# Patient Record
Sex: Female | Born: 1935 | Race: White | Hispanic: No | Smoking: Never smoker
Health system: Southern US, Community
[De-identification: ages and names within clinical notes are randomized; demographics above are authoritative.]

## PROBLEM LIST (undated history)

## (undated) DIAGNOSIS — I10 Essential (primary) hypertension: Secondary | ICD-10-CM

## (undated) DIAGNOSIS — E785 Hyperlipidemia, unspecified: Secondary | ICD-10-CM

## (undated) DIAGNOSIS — E119 Type 2 diabetes mellitus without complications: Secondary | ICD-10-CM

## (undated) DIAGNOSIS — G4730 Sleep apnea, unspecified: Secondary | ICD-10-CM

## (undated) DIAGNOSIS — C801 Malignant (primary) neoplasm, unspecified: Secondary | ICD-10-CM

## (undated) DIAGNOSIS — M21371 Foot drop, right foot: Secondary | ICD-10-CM

## (undated) HISTORY — DX: Essential (primary) hypertension: I10

## (undated) HISTORY — DX: Type 2 diabetes mellitus without complications: E11.9

## (undated) HISTORY — DX: Foot drop, right foot: M21.371

## (undated) HISTORY — DX: Hyperlipidemia, unspecified: E78.5

## (undated) HISTORY — PX: COLONOSCOPY: SHX174

## (undated) HISTORY — DX: Malignant (primary) neoplasm, unspecified: C80.1

## (undated) HISTORY — DX: Sleep apnea, unspecified: G47.30

---

## 1989-06-03 HISTORY — PX: OTHER SURGICAL HISTORY: SHX169

## 1989-06-03 HISTORY — PX: COLON SURGERY: SHX602

## 2005-04-02 ENCOUNTER — Ambulatory Visit: Payer: Self-pay | Admitting: Family Medicine

## 2005-04-03 ENCOUNTER — Ambulatory Visit: Payer: Self-pay | Admitting: Family Medicine

## 2005-05-03 ENCOUNTER — Ambulatory Visit: Payer: Self-pay | Admitting: Family Medicine

## 2005-05-21 ENCOUNTER — Ambulatory Visit: Payer: Self-pay | Admitting: General Surgery

## 2005-05-21 LAB — HM COLONOSCOPY: HM Colonoscopy: NORMAL

## 2005-06-03 ENCOUNTER — Ambulatory Visit: Payer: Self-pay | Admitting: Family Medicine

## 2005-09-03 ENCOUNTER — Ambulatory Visit: Payer: Self-pay | Admitting: Family Medicine

## 2011-06-20 DIAGNOSIS — H269 Unspecified cataract: Secondary | ICD-10-CM | POA: Diagnosis not present

## 2011-06-20 DIAGNOSIS — E119 Type 2 diabetes mellitus without complications: Secondary | ICD-10-CM | POA: Diagnosis not present

## 2011-07-01 DIAGNOSIS — E7800 Pure hypercholesterolemia, unspecified: Secondary | ICD-10-CM | POA: Diagnosis not present

## 2011-07-01 DIAGNOSIS — I10 Essential (primary) hypertension: Secondary | ICD-10-CM | POA: Diagnosis not present

## 2011-07-01 DIAGNOSIS — E119 Type 2 diabetes mellitus without complications: Secondary | ICD-10-CM | POA: Diagnosis not present

## 2011-07-01 DIAGNOSIS — Z23 Encounter for immunization: Secondary | ICD-10-CM | POA: Diagnosis not present

## 2011-10-30 DIAGNOSIS — D649 Anemia, unspecified: Secondary | ICD-10-CM | POA: Diagnosis not present

## 2011-10-30 DIAGNOSIS — E119 Type 2 diabetes mellitus without complications: Secondary | ICD-10-CM | POA: Diagnosis not present

## 2011-10-30 DIAGNOSIS — E7800 Pure hypercholesterolemia, unspecified: Secondary | ICD-10-CM | POA: Diagnosis not present

## 2011-10-30 DIAGNOSIS — Z23 Encounter for immunization: Secondary | ICD-10-CM | POA: Diagnosis not present

## 2011-10-30 DIAGNOSIS — I10 Essential (primary) hypertension: Secondary | ICD-10-CM | POA: Diagnosis not present

## 2011-10-30 DIAGNOSIS — K5900 Constipation, unspecified: Secondary | ICD-10-CM | POA: Diagnosis not present

## 2011-10-30 DIAGNOSIS — Z0000 Encounter for general adult medical examination without abnormal findings: Secondary | ICD-10-CM | POA: Diagnosis not present

## 2011-11-06 DIAGNOSIS — I10 Essential (primary) hypertension: Secondary | ICD-10-CM | POA: Diagnosis not present

## 2011-11-06 DIAGNOSIS — E119 Type 2 diabetes mellitus without complications: Secondary | ICD-10-CM | POA: Diagnosis not present

## 2011-11-06 DIAGNOSIS — R079 Chest pain, unspecified: Secondary | ICD-10-CM | POA: Diagnosis not present

## 2011-11-06 DIAGNOSIS — G4730 Sleep apnea, unspecified: Secondary | ICD-10-CM | POA: Diagnosis not present

## 2011-11-29 ENCOUNTER — Ambulatory Visit: Payer: Self-pay | Admitting: Family Medicine

## 2011-11-29 DIAGNOSIS — Z1231 Encounter for screening mammogram for malignant neoplasm of breast: Secondary | ICD-10-CM | POA: Diagnosis not present

## 2011-11-29 DIAGNOSIS — R928 Other abnormal and inconclusive findings on diagnostic imaging of breast: Secondary | ICD-10-CM | POA: Diagnosis not present

## 2011-12-18 DIAGNOSIS — M25519 Pain in unspecified shoulder: Secondary | ICD-10-CM | POA: Diagnosis not present

## 2011-12-18 DIAGNOSIS — E7800 Pure hypercholesterolemia, unspecified: Secondary | ICD-10-CM | POA: Diagnosis not present

## 2011-12-18 DIAGNOSIS — Z0000 Encounter for general adult medical examination without abnormal findings: Secondary | ICD-10-CM | POA: Diagnosis not present

## 2011-12-18 DIAGNOSIS — M129 Arthropathy, unspecified: Secondary | ICD-10-CM | POA: Diagnosis not present

## 2012-01-07 DIAGNOSIS — M75100 Unspecified rotator cuff tear or rupture of unspecified shoulder, not specified as traumatic: Secondary | ICD-10-CM | POA: Diagnosis not present

## 2012-01-07 DIAGNOSIS — M1710 Unilateral primary osteoarthritis, unspecified knee: Secondary | ICD-10-CM | POA: Diagnosis not present

## 2012-01-08 DIAGNOSIS — M75100 Unspecified rotator cuff tear or rupture of unspecified shoulder, not specified as traumatic: Secondary | ICD-10-CM | POA: Diagnosis not present

## 2012-01-09 DIAGNOSIS — H269 Unspecified cataract: Secondary | ICD-10-CM | POA: Diagnosis not present

## 2012-01-09 DIAGNOSIS — E119 Type 2 diabetes mellitus without complications: Secondary | ICD-10-CM | POA: Diagnosis not present

## 2012-01-10 DIAGNOSIS — M75100 Unspecified rotator cuff tear or rupture of unspecified shoulder, not specified as traumatic: Secondary | ICD-10-CM | POA: Diagnosis not present

## 2012-01-13 DIAGNOSIS — M75100 Unspecified rotator cuff tear or rupture of unspecified shoulder, not specified as traumatic: Secondary | ICD-10-CM | POA: Diagnosis not present

## 2012-01-15 DIAGNOSIS — M75100 Unspecified rotator cuff tear or rupture of unspecified shoulder, not specified as traumatic: Secondary | ICD-10-CM | POA: Diagnosis not present

## 2012-01-20 DIAGNOSIS — M75100 Unspecified rotator cuff tear or rupture of unspecified shoulder, not specified as traumatic: Secondary | ICD-10-CM | POA: Diagnosis not present

## 2012-01-22 DIAGNOSIS — M75100 Unspecified rotator cuff tear or rupture of unspecified shoulder, not specified as traumatic: Secondary | ICD-10-CM | POA: Diagnosis not present

## 2012-01-27 DIAGNOSIS — M1710 Unilateral primary osteoarthritis, unspecified knee: Secondary | ICD-10-CM | POA: Diagnosis not present

## 2012-01-27 DIAGNOSIS — M75100 Unspecified rotator cuff tear or rupture of unspecified shoulder, not specified as traumatic: Secondary | ICD-10-CM | POA: Diagnosis not present

## 2012-01-29 DIAGNOSIS — M1710 Unilateral primary osteoarthritis, unspecified knee: Secondary | ICD-10-CM | POA: Diagnosis not present

## 2012-01-29 DIAGNOSIS — M75100 Unspecified rotator cuff tear or rupture of unspecified shoulder, not specified as traumatic: Secondary | ICD-10-CM | POA: Diagnosis not present

## 2012-01-31 DIAGNOSIS — H2510 Age-related nuclear cataract, unspecified eye: Secondary | ICD-10-CM | POA: Diagnosis not present

## 2012-02-05 DIAGNOSIS — M75100 Unspecified rotator cuff tear or rupture of unspecified shoulder, not specified as traumatic: Secondary | ICD-10-CM | POA: Diagnosis not present

## 2012-02-07 DIAGNOSIS — H2510 Age-related nuclear cataract, unspecified eye: Secondary | ICD-10-CM | POA: Diagnosis not present

## 2012-02-11 DIAGNOSIS — M75100 Unspecified rotator cuff tear or rupture of unspecified shoulder, not specified as traumatic: Secondary | ICD-10-CM | POA: Diagnosis not present

## 2012-02-13 DIAGNOSIS — M75100 Unspecified rotator cuff tear or rupture of unspecified shoulder, not specified as traumatic: Secondary | ICD-10-CM | POA: Diagnosis not present

## 2012-02-18 DIAGNOSIS — M75100 Unspecified rotator cuff tear or rupture of unspecified shoulder, not specified as traumatic: Secondary | ICD-10-CM | POA: Diagnosis not present

## 2012-02-20 DIAGNOSIS — M75100 Unspecified rotator cuff tear or rupture of unspecified shoulder, not specified as traumatic: Secondary | ICD-10-CM | POA: Diagnosis not present

## 2012-02-25 DIAGNOSIS — M75100 Unspecified rotator cuff tear or rupture of unspecified shoulder, not specified as traumatic: Secondary | ICD-10-CM | POA: Diagnosis not present

## 2012-02-27 DIAGNOSIS — E7800 Pure hypercholesterolemia, unspecified: Secondary | ICD-10-CM | POA: Diagnosis not present

## 2012-02-27 DIAGNOSIS — M75100 Unspecified rotator cuff tear or rupture of unspecified shoulder, not specified as traumatic: Secondary | ICD-10-CM | POA: Diagnosis not present

## 2012-02-27 DIAGNOSIS — Z23 Encounter for immunization: Secondary | ICD-10-CM | POA: Diagnosis not present

## 2012-02-27 DIAGNOSIS — Z0000 Encounter for general adult medical examination without abnormal findings: Secondary | ICD-10-CM | POA: Diagnosis not present

## 2012-03-03 DIAGNOSIS — M75100 Unspecified rotator cuff tear or rupture of unspecified shoulder, not specified as traumatic: Secondary | ICD-10-CM | POA: Diagnosis not present

## 2012-03-03 DIAGNOSIS — M1710 Unilateral primary osteoarthritis, unspecified knee: Secondary | ICD-10-CM | POA: Diagnosis not present

## 2012-03-23 DIAGNOSIS — Z961 Presence of intraocular lens: Secondary | ICD-10-CM | POA: Diagnosis not present

## 2012-03-23 DIAGNOSIS — H2510 Age-related nuclear cataract, unspecified eye: Secondary | ICD-10-CM | POA: Diagnosis not present

## 2012-03-23 DIAGNOSIS — E119 Type 2 diabetes mellitus without complications: Secondary | ICD-10-CM | POA: Diagnosis not present

## 2012-03-23 DIAGNOSIS — H269 Unspecified cataract: Secondary | ICD-10-CM | POA: Diagnosis not present

## 2012-03-24 DIAGNOSIS — H2510 Age-related nuclear cataract, unspecified eye: Secondary | ICD-10-CM | POA: Diagnosis not present

## 2012-04-01 DIAGNOSIS — Z0000 Encounter for general adult medical examination without abnormal findings: Secondary | ICD-10-CM | POA: Diagnosis not present

## 2012-04-01 DIAGNOSIS — I10 Essential (primary) hypertension: Secondary | ICD-10-CM | POA: Diagnosis not present

## 2012-04-01 DIAGNOSIS — G4730 Sleep apnea, unspecified: Secondary | ICD-10-CM | POA: Diagnosis not present

## 2012-04-01 DIAGNOSIS — E119 Type 2 diabetes mellitus without complications: Secondary | ICD-10-CM | POA: Diagnosis not present

## 2012-04-03 HISTORY — PX: EYE SURGERY: SHX253

## 2012-04-20 DIAGNOSIS — E119 Type 2 diabetes mellitus without complications: Secondary | ICD-10-CM | POA: Diagnosis not present

## 2012-04-20 DIAGNOSIS — H269 Unspecified cataract: Secondary | ICD-10-CM | POA: Diagnosis not present

## 2012-04-20 DIAGNOSIS — Z961 Presence of intraocular lens: Secondary | ICD-10-CM | POA: Diagnosis not present

## 2012-04-20 DIAGNOSIS — H2510 Age-related nuclear cataract, unspecified eye: Secondary | ICD-10-CM | POA: Diagnosis not present

## 2012-05-11 DIAGNOSIS — Z23 Encounter for immunization: Secondary | ICD-10-CM | POA: Diagnosis not present

## 2012-05-11 DIAGNOSIS — G4730 Sleep apnea, unspecified: Secondary | ICD-10-CM | POA: Diagnosis not present

## 2012-06-03 HISTORY — PX: MASTECTOMY: SHX3

## 2012-07-29 DIAGNOSIS — E119 Type 2 diabetes mellitus without complications: Secondary | ICD-10-CM | POA: Diagnosis not present

## 2012-07-29 DIAGNOSIS — F4320 Adjustment disorder, unspecified: Secondary | ICD-10-CM | POA: Diagnosis not present

## 2012-07-29 DIAGNOSIS — E7800 Pure hypercholesterolemia, unspecified: Secondary | ICD-10-CM | POA: Diagnosis not present

## 2012-07-29 DIAGNOSIS — I10 Essential (primary) hypertension: Secondary | ICD-10-CM | POA: Diagnosis not present

## 2012-08-11 DIAGNOSIS — R079 Chest pain, unspecified: Secondary | ICD-10-CM | POA: Diagnosis not present

## 2012-08-11 DIAGNOSIS — I10 Essential (primary) hypertension: Secondary | ICD-10-CM | POA: Diagnosis not present

## 2012-08-11 DIAGNOSIS — E782 Mixed hyperlipidemia: Secondary | ICD-10-CM | POA: Diagnosis not present

## 2012-10-28 DIAGNOSIS — E119 Type 2 diabetes mellitus without complications: Secondary | ICD-10-CM | POA: Diagnosis not present

## 2012-10-28 DIAGNOSIS — F4320 Adjustment disorder, unspecified: Secondary | ICD-10-CM | POA: Diagnosis not present

## 2012-10-28 DIAGNOSIS — B351 Tinea unguium: Secondary | ICD-10-CM | POA: Diagnosis not present

## 2012-10-28 DIAGNOSIS — E7800 Pure hypercholesterolemia, unspecified: Secondary | ICD-10-CM | POA: Diagnosis not present

## 2012-10-28 DIAGNOSIS — I10 Essential (primary) hypertension: Secondary | ICD-10-CM | POA: Diagnosis not present

## 2012-10-28 DIAGNOSIS — R609 Edema, unspecified: Secondary | ICD-10-CM | POA: Diagnosis not present

## 2012-11-06 DIAGNOSIS — D649 Anemia, unspecified: Secondary | ICD-10-CM | POA: Diagnosis not present

## 2012-11-09 DIAGNOSIS — M79609 Pain in unspecified limb: Secondary | ICD-10-CM | POA: Diagnosis not present

## 2012-11-09 DIAGNOSIS — B351 Tinea unguium: Secondary | ICD-10-CM | POA: Diagnosis not present

## 2012-11-09 DIAGNOSIS — E119 Type 2 diabetes mellitus without complications: Secondary | ICD-10-CM | POA: Diagnosis not present

## 2013-01-27 DIAGNOSIS — I10 Essential (primary) hypertension: Secondary | ICD-10-CM | POA: Diagnosis not present

## 2013-01-27 DIAGNOSIS — Z23 Encounter for immunization: Secondary | ICD-10-CM | POA: Diagnosis not present

## 2013-01-27 DIAGNOSIS — F4320 Adjustment disorder, unspecified: Secondary | ICD-10-CM | POA: Diagnosis not present

## 2013-01-27 DIAGNOSIS — E119 Type 2 diabetes mellitus without complications: Secondary | ICD-10-CM | POA: Diagnosis not present

## 2013-01-27 DIAGNOSIS — E7800 Pure hypercholesterolemia, unspecified: Secondary | ICD-10-CM | POA: Diagnosis not present

## 2013-02-04 ENCOUNTER — Ambulatory Visit: Payer: Self-pay | Admitting: Family Medicine

## 2013-02-04 DIAGNOSIS — R922 Inconclusive mammogram: Secondary | ICD-10-CM | POA: Diagnosis not present

## 2013-02-04 DIAGNOSIS — Z1231 Encounter for screening mammogram for malignant neoplasm of breast: Secondary | ICD-10-CM | POA: Diagnosis not present

## 2013-02-08 DIAGNOSIS — E119 Type 2 diabetes mellitus without complications: Secondary | ICD-10-CM | POA: Diagnosis not present

## 2013-02-08 DIAGNOSIS — I10 Essential (primary) hypertension: Secondary | ICD-10-CM | POA: Diagnosis not present

## 2013-02-08 DIAGNOSIS — I2789 Other specified pulmonary heart diseases: Secondary | ICD-10-CM | POA: Diagnosis not present

## 2013-02-08 DIAGNOSIS — G4730 Sleep apnea, unspecified: Secondary | ICD-10-CM | POA: Diagnosis not present

## 2013-02-17 ENCOUNTER — Ambulatory Visit: Payer: Self-pay | Admitting: Family Medicine

## 2013-02-17 DIAGNOSIS — N63 Unspecified lump in unspecified breast: Secondary | ICD-10-CM | POA: Diagnosis not present

## 2013-02-17 LAB — HM MAMMOGRAPHY

## 2013-02-24 ENCOUNTER — Ambulatory Visit: Payer: Self-pay | Admitting: Family Medicine

## 2013-02-24 DIAGNOSIS — C50219 Malignant neoplasm of upper-inner quadrant of unspecified female breast: Secondary | ICD-10-CM | POA: Diagnosis not present

## 2013-02-25 HISTORY — PX: BREAST BIOPSY: SHX20

## 2013-02-26 ENCOUNTER — Encounter: Payer: Self-pay | Admitting: *Deleted

## 2013-03-03 DIAGNOSIS — M21371 Foot drop, right foot: Secondary | ICD-10-CM

## 2013-03-03 DIAGNOSIS — C801 Malignant (primary) neoplasm, unspecified: Secondary | ICD-10-CM

## 2013-03-03 HISTORY — DX: Foot drop, right foot: M21.371

## 2013-03-03 HISTORY — DX: Malignant (primary) neoplasm, unspecified: C80.1

## 2013-03-03 LAB — PATHOLOGY REPORT

## 2013-03-04 ENCOUNTER — Ambulatory Visit (INDEPENDENT_AMBULATORY_CARE_PROVIDER_SITE_OTHER): Payer: Medicare Other | Admitting: General Surgery

## 2013-03-04 ENCOUNTER — Encounter: Payer: Self-pay | Admitting: General Surgery

## 2013-03-04 ENCOUNTER — Other Ambulatory Visit: Payer: Medicare Other

## 2013-03-04 VITALS — BP 138/60 | HR 80 | Resp 16 | Ht 67.5 in | Wt 231.0 lb

## 2013-03-04 DIAGNOSIS — C50912 Malignant neoplasm of unspecified site of left female breast: Secondary | ICD-10-CM

## 2013-03-04 DIAGNOSIS — C50919 Malignant neoplasm of unspecified site of unspecified female breast: Secondary | ICD-10-CM

## 2013-03-04 DIAGNOSIS — Z853 Personal history of malignant neoplasm of breast: Secondary | ICD-10-CM | POA: Insufficient documentation

## 2013-03-04 NOTE — Progress Notes (Signed)
Patient ID: Robin Ashley, female   DOB: [birth year], 79 y.o.   MRN: [id number]  Chief Complaint  Patient presents with  . Follow-up    mammogram    HPI Robin Ashley is a 79 y.o. female here for follow up from left breast biopsy done at [facility] on 02/25/13. Pathology was positive for carcinoma. The patient denies anything problems with the breasts. No prior breast problems or family history. She does not check her breasts regularly but does get regular mammograms done.   The patient's recent mammogram was abnormal prompting additional studies including core biopsy. She is accompanied today by her daughter, Robin Ashley, was present for the interview and exam.  HPI  Past Medical History  Diagnosis Date  . Diabetes mellitus without complication   . Hypertension   . Hyperlipidemia     Past Surgical History  Procedure Laterality Date  . Intestinal balloon removed  1991  . Eye surgery Bilateral 04/2012  . Breast biopsy Left 02/25/13    Family History  Problem Relation Age of Onset  . Diabetes Brother     Social History History  Substance Use Topics  . Smoking status: Never Smoker   . Smokeless tobacco: Never Used  . Alcohol Use: No    No Known Allergies  Current Outpatient Prescriptions  Medication Sig Dispense Refill  . amLODipine (NORVASC) 10 MG tablet Take 10 mg by mouth daily.      Marland Kitchen atorvastatin (LIPITOR) 80 MG tablet Take 80 mg by mouth daily.      Marland Kitchen etodolac (LODINE) 300 MG capsule Take 300 mg by mouth 3 (three) times daily.      . furosemide (LASIX) 20 MG tablet Take 20 mg by mouth daily.      Marland Kitchen lisinopril (PRINIVIL,ZESTRIL) 10 MG tablet Take 10 mg by mouth daily.      . metFORMIN (GLUCOPHAGE) 500 MG tablet Take 500 mg by mouth 2 (two) times daily with a meal.      . Multiple Vitamins-Minerals (CENTRUM SILVER PO) Take 1 tablet by mouth daily.       No current facility-administered medications for this visit.    Review of Systems Review of Systems   Constitutional: Negative.   Respiratory: Negative.   Cardiovascular: Negative.     Blood pressure 138/60, pulse 80, resp. rate 16, height 5' 7.5" (1.715 m), weight 231 lb (104.781 kg).  Physical Exam Physical Exam  Constitutional: She is oriented to person, place, and time. She appears well-developed and well-nourished.  Neck: No thyromegaly present.  Cardiovascular: Normal rate, regular rhythm and normal heart sounds.   No murmur heard. Pulmonary/Chest: Effort normal and breath sounds normal. Right breast exhibits no inverted nipple, no mass, no nipple discharge, no skin change and no tenderness. Left breast exhibits no inverted nipple, no mass, no nipple discharge, no skin change and no tenderness.  Bruise in the left upper outer quadrant.   Lymphadenopathy:    She has no cervical adenopathy.    She has no axillary adenopathy.  Neurological: She is alert and oriented to person, place, and time.  Skin: Skin is warm and dry.    Data Reviewed Screening mammograms dated February 04, 2013 showed a new left breast mass. BI-RAD-0.  Focal spot compression views and ultrasound dated February 17, 2013 showed a 0.8 x 0.8 x 1.0 cm irregular hypoechoic mass 11:00 position of the left breast. BI-RAD-4. No axillary adenopathy appreciated.  Ultrasound-guided core biopsy was completed February 24, 2013. Findings of invasive grade one  mammary carcinoma detected.  Pathology shows histologic grade one 8 mm invasive mammary carcinoma, ER-positive, PR positive, HER-2/neu not overexpressing.  Ultrasound examination of the left breast at the 10-11:00 position, 10 cm in the nipple showed an irregular hypoechoic mass measuring 0.73 x 0.91 x 1.01 cm. A biopsy clip is evident.  Assessment    Stage I carcinoma of the left breast.  Non-insulin-dependent diabetes mellitus.  Essential hypertension.  Hyperlipidemia.     Plan    Options for management of this early stage I breast cancer were  reviewed.  It was emphasized to the patient that she has adequate time to make an informed decision. Breast conservation and mastectomy were presented as equivalent options. The opportunity of second surgical opinion was discussed.  At this time the patient is leaning towards breast conservation. Informational brochure was provided, and the daughter was given [facility] site information for her review if needed.  At present the patient is leaning towards breast conservation. She Will be contacted regarding scheduling wide excision, mastoplasty and sentinel node biopsy at her convenience.        [name] 03/04/2013, 8:18 PM

## 2013-03-07 ENCOUNTER — Telehealth: Payer: Self-pay | Admitting: General Surgery

## 2013-03-07 NOTE — Telephone Encounter (Signed)
I spoke with Ms. Maring's daughter who reported on reflection her mother really did not want to do radiation and wanted no chance of recurrence. I think it's reasonable to proceed with mastectomy as well as a sentinel node biopsy considering she had an invasive mammary cancer.  Patient will likely do well as an outpatient, bled observation will be available as needed. The patient's daughter has been asked to plan on staying with her mother the night of surgery, and after she will play by ear.

## 2013-03-08 ENCOUNTER — Telehealth: Payer: Self-pay | Admitting: *Deleted

## 2013-03-08 NOTE — Telephone Encounter (Signed)
Patient's surgery has been scheduled for 03-15-13. Daughter is aware of all instructions.

## 2013-03-08 NOTE — Telephone Encounter (Signed)
Message copied by Nicholes Mango on Mon Mar 08, 2013 10:21 AM ------      Message from: [place], Merrily Pew      Created: Sun Mar 07, 2013  8:42 PM       Please go ahead and schedule for a mastectomy with sentinel node biopsy. [id number]. Outpatient. I spoke with the daughter the evening of October 5.      ----- Message -----         From: Wendall Stade, CMA         Sent: 03/05/2013  10:17 AM           To: [name], MD            Patient's daughter called the office to state that patient would like to have a mastectomy instead of lumpectomy. Will patient still require a SLN biopsy? Will you only need 2 hours for case? Is the code 96045?            Daughter wants to confirm that this will be an outpatient surgery. Also, how long will daughter need to stay with patient during her recovery?        ------

## 2013-03-09 ENCOUNTER — Ambulatory Visit: Payer: Self-pay | Admitting: General Surgery

## 2013-03-09 ENCOUNTER — Other Ambulatory Visit: Payer: Self-pay | Admitting: General Surgery

## 2013-03-09 DIAGNOSIS — C50912 Malignant neoplasm of unspecified site of left female breast: Secondary | ICD-10-CM

## 2013-03-09 DIAGNOSIS — Z01812 Encounter for preprocedural laboratory examination: Secondary | ICD-10-CM | POA: Diagnosis not present

## 2013-03-09 DIAGNOSIS — C50919 Malignant neoplasm of unspecified site of unspecified female breast: Secondary | ICD-10-CM | POA: Diagnosis not present

## 2013-03-09 LAB — LAB REPORT - SCANNED
CO2: 29 mmol/L — AB (ref 13–22)
Calcium: 9.2 mg/dL (ref 8.7–10.7)
Chloride: 106 mmol/L (ref 99–108)
Creatinine: 1.13
Glucose: 112 mg/dL
Hgb: 12.3
MCH: 31 pg (ref 26.0–34.0)
MCV: 92 fL (ref 82.0–108.0)
Platelets: 300
RBC: 3.95 10*6/uL — AB (ref 4.00–5.20)
WBC: 9.1 10^3/mL

## 2013-03-09 LAB — BASIC METABOLIC PANEL
Anion Gap: 3 — ABNORMAL LOW (ref 7–16)
BUN: 13 mg/dL (ref 7–18)
Creatinine: 1.13 mg/dL (ref 0.60–1.30)
EGFR (African American): 54 — ABNORMAL LOW
Glucose: 112 mg/dL — ABNORMAL HIGH (ref 65–99)
Sodium: 138 mmol/L (ref 136–145)

## 2013-03-09 LAB — CBC WITH DIFFERENTIAL/PLATELET
Eosinophil #: 0.3 10*3/uL (ref 0.0–0.7)
Eosinophil %: 3 %
Lymphocyte #: 2.6 10*3/uL (ref 1.0–3.6)
MCV: 92 fL (ref 80–100)
Neutrophil #: 5.4 10*3/uL (ref 1.4–6.5)
RBC: 3.95 10*6/uL (ref 3.80–5.20)
RDW: 15 % — ABNORMAL HIGH (ref 11.5–14.5)

## 2013-03-15 ENCOUNTER — Ambulatory Visit: Payer: Self-pay | Admitting: General Surgery

## 2013-03-15 ENCOUNTER — Encounter: Payer: Self-pay | Admitting: General Surgery

## 2013-03-15 DIAGNOSIS — C50919 Malignant neoplasm of unspecified site of unspecified female breast: Secondary | ICD-10-CM | POA: Diagnosis not present

## 2013-03-15 DIAGNOSIS — Z833 Family history of diabetes mellitus: Secondary | ICD-10-CM | POA: Diagnosis not present

## 2013-03-15 DIAGNOSIS — Z170 Estrogen receptor positive status [ER+]: Secondary | ICD-10-CM | POA: Diagnosis not present

## 2013-03-15 DIAGNOSIS — M129 Arthropathy, unspecified: Secondary | ICD-10-CM | POA: Diagnosis not present

## 2013-03-15 DIAGNOSIS — C50419 Malignant neoplasm of upper-outer quadrant of unspecified female breast: Secondary | ICD-10-CM | POA: Diagnosis not present

## 2013-03-15 DIAGNOSIS — G4730 Sleep apnea, unspecified: Secondary | ICD-10-CM | POA: Diagnosis not present

## 2013-03-15 DIAGNOSIS — R599 Enlarged lymph nodes, unspecified: Secondary | ICD-10-CM | POA: Diagnosis not present

## 2013-03-15 DIAGNOSIS — E119 Type 2 diabetes mellitus without complications: Secondary | ICD-10-CM | POA: Diagnosis not present

## 2013-03-15 DIAGNOSIS — Z79899 Other long term (current) drug therapy: Secondary | ICD-10-CM | POA: Diagnosis not present

## 2013-03-15 DIAGNOSIS — R609 Edema, unspecified: Secondary | ICD-10-CM | POA: Diagnosis not present

## 2013-03-15 DIAGNOSIS — I10 Essential (primary) hypertension: Secondary | ICD-10-CM | POA: Diagnosis not present

## 2013-03-15 HISTORY — PX: BREAST SURGERY: SHX581

## 2013-03-16 ENCOUNTER — Telehealth: Payer: Self-pay | Admitting: *Deleted

## 2013-03-16 ENCOUNTER — Encounter: Payer: Self-pay | Admitting: General Surgery

## 2013-03-16 NOTE — Telephone Encounter (Signed)
Called to check on patient-postoperative left mastectomy 03-15-13.  Daughter states she "rolled her ankle" yesterday leaving and that they have been putting ice on it.  She says her foot/toe is still dragging.  She does better without shoes so she can see her foot when she walks. Aware to bring her brace when she comes to appointment, daughter states she is wearing it now as well.  She says she is doing well from the breast surgery. Follow up Thursday as scheduled, may need follow up with orthopedic- Dr Carollee Massed.

## 2013-03-18 ENCOUNTER — Ambulatory Visit (INDEPENDENT_AMBULATORY_CARE_PROVIDER_SITE_OTHER): Payer: Medicare Other | Admitting: General Surgery

## 2013-03-18 ENCOUNTER — Ambulatory Visit: Payer: Medicare Other

## 2013-03-18 ENCOUNTER — Encounter: Payer: Self-pay | Admitting: General Surgery

## 2013-03-18 VITALS — BP 130/72 | Ht 67.5 in | Wt 217.0 lb

## 2013-03-18 DIAGNOSIS — C50912 Malignant neoplasm of unspecified site of left female breast: Secondary | ICD-10-CM

## 2013-03-18 DIAGNOSIS — C50919 Malignant neoplasm of unspecified site of unspecified female breast: Secondary | ICD-10-CM

## 2013-03-18 DIAGNOSIS — G609 Hereditary and idiopathic neuropathy, unspecified: Secondary | ICD-10-CM | POA: Diagnosis not present

## 2013-03-18 DIAGNOSIS — S8263XA Displaced fracture of lateral malleolus of unspecified fibula, initial encounter for closed fracture: Secondary | ICD-10-CM | POA: Diagnosis not present

## 2013-03-18 LAB — PATHOLOGY REPORT

## 2013-03-18 NOTE — Patient Instructions (Signed)
Patient to return as schedule

## 2013-03-18 NOTE — Progress Notes (Signed)
Patient ID: Robin Ashley, female   DOB: 1931/10/15, 79 y.o.   MRN: [id number]  Chief Complaint  Patient presents with  . Routine Post Op    HPI Robin Ashley is a 79 y.o. female.  Here today for her postoperative visit. She had a left mastectomy with sentinel node excision done 03-15-13. Postoperatively she had right foot drop and right ankle swelling, she has been using ice. Denies a lot of pain in left breast area and JP drain remains in place.  Patient reported unusual sensation in the right ankle after recent mastectomy. Exam suggestive of superficial peroneal nerve dysfunction. The case was reviewed by phone with Deeann Saint, M.D. who had recommended a trial of observation. Patient is now seen 3 days postop with the same complaints, aggravated by soreness in the ankle after it twisted. She is using her walker as requested.   HPI  Past Medical History  Diagnosis Date  . Diabetes mellitus without complication   . Hypertension   . Hyperlipidemia     Past Surgical History  Procedure Laterality Date  . Intestinal balloon removed  1991  . Eye surgery Bilateral 04/2012  . Breast biopsy Left 02/25/13  . Breast surgery Left 03-15-13    with SN biopsy    Family History  Problem Relation Age of Onset  . Diabetes Brother     Social History History  Substance Use Topics  . Smoking status: Never Smoker   . Smokeless tobacco: Never Used  . Alcohol Use: No    No Known Allergies  Current Outpatient Prescriptions  Medication Sig Dispense Refill  . amLODipine (NORVASC) 10 MG tablet Take 10 mg by mouth daily.      Marland Kitchen atorvastatin (LIPITOR) 80 MG tablet Take 80 mg by mouth daily.      Marland Kitchen etodolac (LODINE) 300 MG capsule Take 300 mg by mouth 3 (three) times daily.      . furosemide (LASIX) 20 MG tablet Take 20 mg by mouth daily.      Marland Kitchen lisinopril (PRINIVIL,ZESTRIL) 10 MG tablet Take 10 mg by mouth daily.      . metFORMIN (GLUCOPHAGE) 500 MG tablet Take 500 mg by mouth 2 (two)  times daily with a meal.      . Multiple Vitamins-Minerals (CENTRUM SILVER PO) Take 1 tablet by mouth daily.       No current facility-administered medications for this visit.    Review of Systems Review of Systems  Constitutional: Negative.   Respiratory: Negative.   Cardiovascular: Negative.   Musculoskeletal: Positive for gait problem and joint swelling (right foot/ankle).    Blood pressure 130/72, height 5' 7.5" (1.715 m), weight 217 lb (98.431 kg).  Physical Exam Physical Exam  Constitutional: She is oriented to person, place, and time. She appears well-developed and well-nourished.  Eyes: Conjunctivae are normal. No scleral icterus.  Pulmonary/Chest:  Left mastectomy site looks clean   Lymphadenopathy:    She has no cervical adenopathy.  Neurological: She is alert and oriented to person, place, and time.  Persistent loss of dorsiflexion on the right.  Previous exam in the day surgery unit showed decreased sensation in the distribution of the superficial peroneal nerve.   Skin: Skin is dry.    Data Reviewed none  Assessment    Right postoperative foot drop. Left mastectomy site healing well.  Drain remains in place.    Plan    Refer to orthopedic physician regarding postoperative foot drop. Has seen Juanell Fairly, MD at [facility]  in the past. His office requested the patient come there this AM for assessment.        [name] 03/22/2013, 8:18 AM

## 2013-03-21 ENCOUNTER — Encounter: Payer: Self-pay | Admitting: General Surgery

## 2013-03-22 ENCOUNTER — Ambulatory Visit (INDEPENDENT_AMBULATORY_CARE_PROVIDER_SITE_OTHER): Payer: Medicare Other | Admitting: General Surgery

## 2013-03-22 ENCOUNTER — Encounter: Payer: Self-pay | Admitting: General Surgery

## 2013-03-22 VITALS — BP 134/64 | HR 76 | Resp 16 | Ht 67.0 in | Wt 221.0 lb

## 2013-03-22 DIAGNOSIS — C50919 Malignant neoplasm of unspecified site of unspecified female breast: Secondary | ICD-10-CM

## 2013-03-22 DIAGNOSIS — C50912 Malignant neoplasm of unspecified site of left female breast: Secondary | ICD-10-CM

## 2013-03-22 NOTE — Progress Notes (Signed)
Patient ID: Robin Ashley, female   DOB: 30-Dec-1931, 79 y.o.   MRN: [id number]  Chief Complaint  Patient presents with  . Routine Post Op    mastectomy    HPI Robin Ashley is a 79 y.o. female Here today for her postoperative visit. She had a left mastectomy with sentinel node excision done 03-15-13. The patient's main complaints relate to the [facility] drain placed at the time of surgery.   HPI  Past Medical History  Diagnosis Date  . Diabetes mellitus without complication   . Hypertension   . Hyperlipidemia     Past Surgical History  Procedure Laterality Date  . Intestinal balloon removed  1991  . Eye surgery Bilateral 04/2012  . Breast biopsy Left 02/25/13  . Breast surgery Left 03-15-13    left mastectomywith SN biopsy    Family History  Problem Relation Age of Onset  . Diabetes Brother     Social History History  Substance Use Topics  . Smoking status: Never Smoker   . Smokeless tobacco: Never Used  . Alcohol Use: No    No Known Allergies  Current Outpatient Prescriptions  Medication Sig Dispense Refill  . amLODipine (NORVASC) 10 MG tablet Take 10 mg by mouth daily.      Marland Kitchen atorvastatin (LIPITOR) 80 MG tablet Take 80 mg by mouth daily.      Marland Kitchen etodolac (LODINE) 300 MG capsule Take 300 mg by mouth 3 (three) times daily.      . furosemide (LASIX) 20 MG tablet Take 20 mg by mouth daily.      Marland Kitchen lisinopril (PRINIVIL,ZESTRIL) 10 MG tablet Take 10 mg by mouth daily.      . metFORMIN (GLUCOPHAGE) 500 MG tablet Take 500 mg by mouth 2 (two) times daily with a meal.      . Multiple Vitamins-Minerals (CENTRUM SILVER PO) Take 1 tablet by mouth daily.       No current facility-administered medications for this visit.    Review of Systems Review of Systems  Respiratory: Negative.   Cardiovascular: Negative.     Blood pressure 134/64, pulse 76, resp. rate 16, height 5\' 7"  (1.702 m), weight 221 lb (100.245 kg).  Physical Exam Physical Exam  Constitutional: She is  oriented to person, place, and time. She appears well-developed and well-nourished.  Eyes: No scleral icterus.  Cardiovascular: Normal rate and normal heart sounds.   Pulmonary/Chest: Breath sounds normal.  Left mastectomy site healing well. Drained removed.  Lymphadenopathy:    She has no cervical adenopathy.  Neurological: She is oriented to person, place, and time.  Skin: Skin is warm.    Data Reviewed Drained volumes that trended generally down and are now running about 30-40 cc per day per pathology on the left mastectomy specimen dated 03/15/2013 showed invasive mammary cancer measuring 12 mm in diameter. Sentinel nodes x2 negative. Total nodes examined 6 negative. T1c, N0, M0. ER/PR positive. No HER-2/neu amplification.  Assessment    Doing well status post left mastectomy for stage I cancer.    Plan    The patient's case will be presented at [facility] tumor board. If no recommendation for adjuvant chemotherapy she will be a candidate for aromatase inhibitor.  We'll plan for followup examination in one week. She is aware that a small amount of fluid making blade under the breast flaps and to call if if she is symptomatic.       Robin Ashley 03/23/2013, 4:51 PM

## 2013-03-22 NOTE — Patient Instructions (Signed)
Patient to return in  

## 2013-03-25 ENCOUNTER — Telehealth: Payer: Self-pay | Admitting: General Surgery

## 2013-03-25 NOTE — Telephone Encounter (Signed)
Case presented at [facility] tumor board earlier today.  No indication for adjuvant chemotherapy, only an aromatase inhibitor. Patient notified of recommendations.

## 2013-03-30 ENCOUNTER — Ambulatory Visit (INDEPENDENT_AMBULATORY_CARE_PROVIDER_SITE_OTHER): Payer: Medicare Other | Admitting: General Surgery

## 2013-03-30 ENCOUNTER — Encounter: Payer: Self-pay | Admitting: General Surgery

## 2013-03-30 VITALS — BP 160/80 | HR 88 | Resp 18 | Ht 67.5 in | Wt 229.0 lb

## 2013-03-30 DIAGNOSIS — C50912 Malignant neoplasm of unspecified site of left female breast: Secondary | ICD-10-CM

## 2013-03-30 DIAGNOSIS — C50919 Malignant neoplasm of unspecified site of unspecified female breast: Secondary | ICD-10-CM

## 2013-03-30 MED ORDER — LETROZOLE 2.5 MG PO TABS: 2.5000 mg | ORAL_TABLET | Freq: Every day | ORAL | Status: DC

## 2013-03-30 NOTE — Progress Notes (Signed)
Patient ID: Robin Ashley, female   DOB: 13-Apr-1932, 79 y.o.   MRN: [id number]  Chief Complaint  Patient presents with  . Follow-up    HPI Robin Ashley is a 79 y.o. female.  Here today for her post op visit she had left mastectomy with SN biopsy 03-15-13. No new complaints. Wearing a support boot to right foot. HPI  Past Medical History  Diagnosis Date  . Diabetes mellitus without complication   . Hypertension   . Hyperlipidemia     Past Surgical History  Procedure Laterality Date  . Intestinal balloon removed  1991  . Eye surgery Bilateral 04/2012  . Breast biopsy Left 02/25/13  . Breast surgery Left 03-15-13    left mastectomywith SN biopsy    Family History  Problem Relation Age of Onset  . Diabetes Brother     Social History History  Substance Use Topics  . Smoking status: Never Smoker   . Smokeless tobacco: Never Used  . Alcohol Use: No    No Known Allergies  Current Outpatient Prescriptions  Medication Sig Dispense Refill  . amLODipine (NORVASC) 10 MG tablet Take 10 mg by mouth daily.      Marland Kitchen atorvastatin (LIPITOR) 80 MG tablet Take 80 mg by mouth daily.      Marland Kitchen etodolac (LODINE) 300 MG capsule Take 300 mg by mouth 3 (three) times daily.      . furosemide (LASIX) 20 MG tablet Take 20 mg by mouth daily.      Marland Kitchen letrozole (FEMARA) 2.5 MG tablet Take 1 tablet (2.5 mg total) by mouth daily.  30 tablet  1  . lisinopril (PRINIVIL,ZESTRIL) 10 MG tablet Take 10 mg by mouth daily.      . metFORMIN (GLUCOPHAGE) 500 MG tablet Take 500 mg by mouth 2 (two) times daily with a meal.      . Multiple Vitamins-Minerals (CENTRUM SILVER PO) Take 1 tablet by mouth daily.       No current facility-administered medications for this visit.    Review of Systems Review of Systems  Constitutional: Negative.   Respiratory: Negative.   Cardiovascular: Negative.     Blood pressure 160/80, pulse 88, resp. rate 18, height 5' 7.5" (1.715 m), weight 229 lb (103.874 kg).  Physical  Exam Physical Exam  Constitutional: She is oriented to person, place, and time. She appears well-developed and well-nourished.  Pulmonary/Chest:  Incisions well healed. 380 ml fluid with drawn. Wrap applied to chest wall.  Neurological: She is alert and oriented to person, place, and time.  Skin: Skin is warm and dry.    Data Reviewed The patient's case was presented at the [facility] tumor board. Adjuvant chemotherapy was not felt indicated. Antiestrogen therapy was felt to be appropriate.  Assessment    Postoperative seroma without evidence of infection.     Plan    A compressive wrap was applied which will be removed in 3 days. She'll be reassessed for recurrent seroma formation in one week.        Robin Ashley 03/30/2013, 7:48 PM

## 2013-03-30 NOTE — Patient Instructions (Addendum)
May take wrap off Friday. Take femara for one month and monitor side effects and call back if she can tolerate the medication or not

## 2013-04-07 ENCOUNTER — Ambulatory Visit (INDEPENDENT_AMBULATORY_CARE_PROVIDER_SITE_OTHER): Payer: Medicare Other | Admitting: General Surgery

## 2013-04-07 ENCOUNTER — Encounter: Payer: Self-pay | Admitting: General Surgery

## 2013-04-07 VITALS — BP 150/78 | HR 80 | Resp 16 | Ht 67.5 in | Wt 228.0 lb

## 2013-04-07 DIAGNOSIS — IMO0002 Reserved for concepts with insufficient information to code with codable children: Secondary | ICD-10-CM | POA: Insufficient documentation

## 2013-04-07 DIAGNOSIS — C50919 Malignant neoplasm of unspecified site of unspecified female breast: Secondary | ICD-10-CM

## 2013-04-07 DIAGNOSIS — T792XXA Traumatic secondary and recurrent hemorrhage and seroma, initial encounter: Secondary | ICD-10-CM

## 2013-04-07 NOTE — Progress Notes (Signed)
Patient ID: Aleighya Mcanelly, female   DOB: 12-Oct-1931, 79 y.o.   MRN: [id number]  Chief Complaint  Patient presents with  . Routine Post Op    HPI Kynley Metzger is a 79 y.o. female.  Here today for her post op visit she had left mastectomy with SN biopsy 03-15-13. No new complaints. Wearing a support boot to right foot. Tolerating Femara.   HPI  Past Medical History  Diagnosis Date  . Diabetes mellitus without complication   . Hypertension   . Hyperlipidemia     Past Surgical History  Procedure Laterality Date  . Intestinal balloon removed  1991  . Eye surgery Bilateral 04/2012  . Breast biopsy Left 02/25/13  . Breast surgery Left 03-15-13    left mastectomywith SN biopsy    Family History  Problem Relation Age of Onset  . Diabetes Brother     Social History History  Substance Use Topics  . Smoking status: Never Smoker   . Smokeless tobacco: Never Used  . Alcohol Use: No    No Known Allergies  Current Outpatient Prescriptions  Medication Sig Dispense Refill  . amLODipine (NORVASC) 10 MG tablet Take 10 mg by mouth daily.      Marland Kitchen atorvastatin (LIPITOR) 80 MG tablet Take 80 mg by mouth daily.      Marland Kitchen etodolac (LODINE) 300 MG capsule Take 300 mg by mouth 3 (three) times daily.      . furosemide (LASIX) 20 MG tablet Take 20 mg by mouth daily.      Marland Kitchen letrozole (FEMARA) 2.5 MG tablet Take 1 tablet (2.5 mg total) by mouth daily.  30 tablet  1  . lisinopril (PRINIVIL,ZESTRIL) 10 MG tablet Take 10 mg by mouth daily.      . metFORMIN (GLUCOPHAGE) 500 MG tablet Take 500 mg by mouth 2 (two) times daily with a meal.      . Multiple Vitamins-Minerals (CENTRUM SILVER PO) Take 1 tablet by mouth daily.       No current facility-administered medications for this visit.    Review of Systems Review of Systems  Constitutional: Negative.   Respiratory: Negative.   Cardiovascular: Negative.     Blood pressure 150/78, pulse 80, resp. rate 16, height 5' 7.5" (1.715 m), weight 228  lb (103.42 kg).  Physical Exam Physical Exam  Constitutional: She is oriented to person, place, and time. She appears well-developed and well-nourished.  Neurological: She is alert and oriented to person, place, and time.  Skin: Skin is warm and dry.  Examination of the left chest wall showed a recurrent seroma.     Assessment    Recurrent seroma status post mastectomy.     Plan    The patient was amenable to placement of a seroma catheter. Site preparation with ChloraPrep. 1 cc of 1% plain Xylocaine. Catheter placed without incident in the midclavicular line and the base of the inframammary fold. Anchored in place with 3-0 nylon. 160 cc of clear serous fluid obtained. Telfa and Tegaderm applied. Procedure well tolerated. Drain care instructions reviewed with the patient and her daughter. We'll plan for a followup exam in one week.        [name] 04/07/2013, 7:59 PM

## 2013-04-07 NOTE — Patient Instructions (Signed)
Record drain amount twice daily

## 2013-04-08 MED ORDER — LETROZOLE 2.5 MG PO TABS: 2.5000 mg | ORAL_TABLET | Freq: Every day | ORAL | Status: DC

## 2013-04-08 NOTE — Addendum Note (Signed)
Addended by: Currie Paris on: 04/08/2013 04:01 PM   Modules accepted: Orders

## 2013-04-15 ENCOUNTER — Ambulatory Visit (INDEPENDENT_AMBULATORY_CARE_PROVIDER_SITE_OTHER): Payer: Medicare Other | Admitting: General Surgery

## 2013-04-15 ENCOUNTER — Encounter: Payer: Self-pay | Admitting: General Surgery

## 2013-04-15 VITALS — BP 142/70 | HR 82 | Resp 16 | Ht 67.0 in | Wt 223.0 lb

## 2013-04-15 DIAGNOSIS — T792XXA Traumatic secondary and recurrent hemorrhage and seroma, initial encounter: Secondary | ICD-10-CM

## 2013-04-15 DIAGNOSIS — C50919 Malignant neoplasm of unspecified site of unspecified female breast: Secondary | ICD-10-CM

## 2013-04-15 NOTE — Progress Notes (Signed)
Patient ID: Robin Ashley, female   DOB: 24-Jun-1931, 79 y.o.   MRN: [id number]  Chief Complaint  Patient presents with  . Routine Post Op    left mastectomy    HPI Robin Ashley is a 79 y.o. female here today for her post op visit she had left mastectomy with SN biopsy 03-15-13. No new complaints. Wearing a support boot to right foot. Tolerating Femara.    HPI  Past Medical History  Diagnosis Date  . Diabetes mellitus without complication   . Hypertension   . Hyperlipidemia     Past Surgical History  Procedure Laterality Date  . Intestinal balloon removed  1991  . Eye surgery Bilateral 04/2012  . Breast biopsy Left 02/25/13  . Breast surgery Left 03-15-13    left mastectomywith SN biopsy    Family History  Problem Relation Age of Onset  . Diabetes Brother     Social History History  Substance Use Topics  . Smoking status: Never Smoker   . Smokeless tobacco: Never Used  . Alcohol Use: No    No Known Allergies  Current Outpatient Prescriptions  Medication Sig Dispense Refill  . amLODipine (NORVASC) 10 MG tablet Take 10 mg by mouth daily.      Marland Kitchen atorvastatin (LIPITOR) 80 MG tablet Take 80 mg by mouth daily.      Marland Kitchen etodolac (LODINE) 300 MG capsule Take 300 mg by mouth 3 (three) times daily.      . furosemide (LASIX) 20 MG tablet Take 20 mg by mouth daily.      Marland Kitchen letrozole (FEMARA) 2.5 MG tablet Take 1 tablet (2.5 mg total) by mouth daily.  30 tablet  1  . letrozole (FEMARA) 2.5 MG tablet Take 1 tablet (2.5 mg total) by mouth daily.  90 tablet  4  . lisinopril (PRINIVIL,ZESTRIL) 10 MG tablet Take 10 mg by mouth daily.      . metFORMIN (GLUCOPHAGE) 500 MG tablet Take 500 mg by mouth 2 (two) times daily with a meal.      . Multiple Vitamins-Minerals (CENTRUM SILVER PO) Take 1 tablet by mouth daily.       No current facility-administered medications for this visit.    Review of Systems Review of Systems  Constitutional: Negative.   Respiratory: Negative.    Cardiovascular: Negative.     Blood pressure 142/70, pulse 82, resp. rate 16, height 5\' 7"  (1.702 m), weight 223 lb (101.152 kg).  Physical Exam Physical Exam  Constitutional: She is oriented to person, place, and time. She appears well-developed and well-nourished.  Eyes: No scleral icterus.  Pulmonary/Chest:  Drain removed.  Lymphadenopathy:    She has no cervical adenopathy.  Neurological: She is alert and oriented to person, place, and time.  Skin: Skin is warm and dry.    Data Reviewed The patient drain record shows volume is less than 30 cc per day for the last several days. The drain was removed without incident.  Assessment    Doing well status post seroma management.     Plan    The patient will followup in one week, earlier problems arise.        [name] 04/15/2013, 9:27 PM

## 2013-04-15 NOTE — Patient Instructions (Signed)
Patient to return in one week. 

## 2013-04-21 ENCOUNTER — Ambulatory Visit (INDEPENDENT_AMBULATORY_CARE_PROVIDER_SITE_OTHER): Payer: Medicare Other | Admitting: General Surgery

## 2013-04-21 ENCOUNTER — Encounter: Payer: Self-pay | Admitting: General Surgery

## 2013-04-21 VITALS — BP 140/70 | HR 72 | Resp 16 | Ht 67.5 in | Wt 227.0 lb

## 2013-04-21 DIAGNOSIS — C50919 Malignant neoplasm of unspecified site of unspecified female breast: Secondary | ICD-10-CM

## 2013-04-21 DIAGNOSIS — T792XXA Traumatic secondary and recurrent hemorrhage and seroma, initial encounter: Secondary | ICD-10-CM

## 2013-04-21 NOTE — Progress Notes (Signed)
Patient ID: Robin Ashley, female   DOB: 1932-01-21, 79 y.o.   MRN: [id number]  Chief Complaint  Patient presents with  . Routine Post Op    HPI Robin Ashley is a 79 y.o. female.  Here today for her post op visit she had left mastectomy with SN biopsy 03-15-13. No new complaints. Wearing a support boot to right foot. Tolerating Femara. She feels like the fluid is back.    HPI  Past Medical History  Diagnosis Date  . Diabetes mellitus without complication   . Hypertension   . Hyperlipidemia   . Cancer 2014    breast    Past Surgical History  Procedure Laterality Date  . Intestinal balloon removed  1991  . Eye surgery Bilateral 04/2012  . Breast biopsy Left 02/25/13  . Breast surgery Left 03-15-13    left mastectomywith SN biopsy    Family History  Problem Relation Age of Onset  . Diabetes Brother     Social History History  Substance Use Topics  . Smoking status: Never Smoker   . Smokeless tobacco: Never Used  . Alcohol Use: No    No Known Allergies  Current Outpatient Prescriptions  Medication Sig Dispense Refill  . amLODipine (NORVASC) 10 MG tablet Take 10 mg by mouth daily.      Marland Kitchen atorvastatin (LIPITOR) 80 MG tablet Take 80 mg by mouth daily.      Marland Kitchen etodolac (LODINE) 300 MG capsule Take 300 mg by mouth 3 (three) times daily.      . furosemide (LASIX) 20 MG tablet Take 20 mg by mouth daily.      Marland Kitchen letrozole (FEMARA) 2.5 MG tablet Take 1 tablet (2.5 mg total) by mouth daily.  90 tablet  4  . lisinopril (PRINIVIL,ZESTRIL) 10 MG tablet Take 10 mg by mouth daily.      . metFORMIN (GLUCOPHAGE) 500 MG tablet Take 500 mg by mouth 2 (two) times daily with a meal.      . Multiple Vitamins-Minerals (CENTRUM SILVER PO) Take 1 tablet by mouth daily.       No current facility-administered medications for this visit.    Review of Systems Review of Systems  Constitutional: Negative.   Respiratory: Negative.   Cardiovascular: Negative.     Blood pressure 140/70,  pulse 72, resp. rate 16, height 5' 7.5" (1.715 m), weight 227 lb (102.967 kg).  Physical Exam Physical Exam  Constitutional: She is oriented to person, place, and time. She appears well-developed and well-nourished.  Pulmonary/Chest:  Incision healing well. 120 ml seroma fluid aspirated.  Lymphadenopathy:    She has no axillary adenopathy.  Neurological: She is alert and oriented to person, place, and time.  Skin: Skin is warm and dry.   Excellent shoulder range of motion.  Data Reviewed No new data.  Assessment    Seroma post mastectomy. Otherwise doing well.    Plan    The patient will be seen by Dr. Evette Cristal next week in my absence.       [name] 04/24/2013, 8:35 AM

## 2013-04-21 NOTE — Patient Instructions (Signed)
Continue self breast exams. Call office for any new breast issues or concerns. 

## 2013-04-22 DIAGNOSIS — H5200 Hypermetropia, unspecified eye: Secondary | ICD-10-CM | POA: Diagnosis not present

## 2013-04-22 DIAGNOSIS — H52229 Regular astigmatism, unspecified eye: Secondary | ICD-10-CM | POA: Diagnosis not present

## 2013-04-22 DIAGNOSIS — E119 Type 2 diabetes mellitus without complications: Secondary | ICD-10-CM | POA: Diagnosis not present

## 2013-04-22 DIAGNOSIS — H524 Presbyopia: Secondary | ICD-10-CM | POA: Diagnosis not present

## 2013-04-26 DIAGNOSIS — S8263XA Displaced fracture of lateral malleolus of unspecified fibula, initial encounter for closed fracture: Secondary | ICD-10-CM | POA: Diagnosis not present

## 2013-04-26 DIAGNOSIS — G609 Hereditary and idiopathic neuropathy, unspecified: Secondary | ICD-10-CM | POA: Diagnosis not present

## 2013-04-28 ENCOUNTER — Ambulatory Visit (INDEPENDENT_AMBULATORY_CARE_PROVIDER_SITE_OTHER): Payer: Medicare Other | Admitting: General Surgery

## 2013-04-28 ENCOUNTER — Encounter: Payer: Self-pay | Admitting: General Surgery

## 2013-04-28 VITALS — BP 160/80 | HR 80 | Resp 14 | Ht 67.5 in | Wt 226.0 lb

## 2013-04-28 DIAGNOSIS — C50912 Malignant neoplasm of unspecified site of left female breast: Secondary | ICD-10-CM

## 2013-04-28 DIAGNOSIS — C50919 Malignant neoplasm of unspecified site of unspecified female breast: Secondary | ICD-10-CM

## 2013-04-28 DIAGNOSIS — Z5189 Encounter for other specified aftercare: Secondary | ICD-10-CM

## 2013-04-28 DIAGNOSIS — T792XXD Traumatic secondary and recurrent hemorrhage and seroma, subsequent encounter: Secondary | ICD-10-CM

## 2013-04-28 NOTE — Progress Notes (Deleted)
Patient ID: Robin Ashley, female   DOB: [birth year], 79 y.o.   MRN: [id number]  Chief Complaint  Patient presents with  . Follow-up    1 week follow up seroma left mastectomy site.     HPI Robin Ashley is a 79 y.o. female who presents for a 1 week follow up seroma of left mastectomy site. The patient states she has fluid built up at this time. She denies any other problems at this time.   HPI  Past Medical History  Diagnosis Date  . Diabetes mellitus without complication   . Hypertension   . Hyperlipidemia   . Cancer 2014    breast    Past Surgical History  Procedure Laterality Date  . Intestinal balloon removed  1991  . Eye surgery Bilateral 04/2012  . Breast biopsy Left 02/25/13  . Breast surgery Left 03-15-13    left mastectomywith SN biopsy    Family History  Problem Relation Age of Onset  . Diabetes Brother     Social History History  Substance Use Topics  . Smoking status: Never Smoker   . Smokeless tobacco: Never Used  . Alcohol Use: No    No Known Allergies  Current Outpatient Prescriptions  Medication Sig Dispense Refill  . amLODipine (NORVASC) 10 MG tablet Take 10 mg by mouth daily.      Marland Kitchen atorvastatin (LIPITOR) 80 MG tablet Take 80 mg by mouth daily.      Marland Kitchen etodolac (LODINE) 300 MG capsule Take 300 mg by mouth 3 (three) times daily.      . furosemide (LASIX) 20 MG tablet Take 20 mg by mouth daily.      Marland Kitchen letrozole (FEMARA) 2.5 MG tablet Take 1 tablet (2.5 mg total) by mouth daily.  90 tablet  4  . lisinopril (PRINIVIL,ZESTRIL) 10 MG tablet Take 10 mg by mouth daily.      . metFORMIN (GLUCOPHAGE) 500 MG tablet Take 500 mg by mouth 2 (two) times daily with a meal.      . Multiple Vitamins-Minerals (CENTRUM SILVER PO) Take 1 tablet by mouth daily.       No current facility-administered medications for this visit.    Review of Systems Review of Systems  Constitutional: Negative.   Respiratory: Negative.   Cardiovascular: Negative.     Blood  pressure 160/80, pulse 80, resp. rate 14, height 5' 7.5" (1.715 m), weight 226 lb (102.513 kg).  Physical Exam Physical Exam  Data Reviewed ***  Assessment    ***    Plan    ***       Darnelle Catalan 04/28/2013, 9:09 AM

## 2013-04-28 NOTE — Patient Instructions (Signed)
Patient to return next week to see Dr. Lemar Livings as scheduled.

## 2013-04-28 NOTE — Progress Notes (Signed)
Robin Ashley is a 79 y.o. female who presents for a 1 week follow up seroma of left mastectomy site. The patient states she has fluid built up at this time. She denies any other problems at this time.   The patient has a recurrent seroma in the central and lateral left breast. No signs of redness or induration.  110 cc drained at today's visit. Patient to return next week to see Dr. Lemar Livings for follow up.

## 2013-05-03 DIAGNOSIS — G609 Hereditary and idiopathic neuropathy, unspecified: Secondary | ICD-10-CM | POA: Diagnosis not present

## 2013-05-03 DIAGNOSIS — M5430 Sciatica, unspecified side: Secondary | ICD-10-CM | POA: Diagnosis not present

## 2013-05-05 ENCOUNTER — Encounter: Payer: Self-pay | Admitting: General Surgery

## 2013-05-05 ENCOUNTER — Ambulatory Visit (INDEPENDENT_AMBULATORY_CARE_PROVIDER_SITE_OTHER): Payer: Medicare Other | Admitting: General Surgery

## 2013-05-05 VITALS — BP 154/78 | HR 72 | Resp 14 | Ht 67.5 in | Wt 228.0 lb

## 2013-05-05 DIAGNOSIS — T792XXD Traumatic secondary and recurrent hemorrhage and seroma, subsequent encounter: Secondary | ICD-10-CM

## 2013-05-05 DIAGNOSIS — C50919 Malignant neoplasm of unspecified site of unspecified female breast: Secondary | ICD-10-CM

## 2013-05-05 NOTE — Patient Instructions (Signed)
Continue self breast exams. Call office for any new breast issues or concerns. 

## 2013-05-05 NOTE — Progress Notes (Signed)
Patient ID: Robin Ashley, female   DOB: December 05, 1931, 79 y.o.   MRN: [id number]  Chief Complaint  Patient presents with  . Follow-up    HPI Robin Ashley is a 79 y.o. female.  here today for her post op visit she had left mastectomy with SN biopsy 03-15-13. No new complaints. Wearing a support boot to right foot. Tolerating Femara she wakes up at night occasionally..  The patient reports minimal discomfort at the mastectomy site. No difficulty with shoulder range of motion reported.  HPI  Past Medical History  Diagnosis Date  . Diabetes mellitus without complication   . Hypertension   . Hyperlipidemia   . Cancer 2014    breast    Past Surgical History  Procedure Laterality Date  . Intestinal balloon removed  1991  . Eye surgery Bilateral 04/2012  . Breast biopsy Left 02/25/13  . Breast surgery Left 03-15-13    left mastectomywith SN biopsy    Family History  Problem Relation Age of Onset  . Diabetes Brother     Social History History  Substance Use Topics  . Smoking status: Never Smoker   . Smokeless tobacco: Never Used  . Alcohol Use: No    No Known Allergies  Current Outpatient Prescriptions  Medication Sig Dispense Refill  . amLODipine (NORVASC) 10 MG tablet Take 10 mg by mouth daily.      Marland Kitchen atorvastatin (LIPITOR) 80 MG tablet Take 80 mg by mouth daily.      Marland Kitchen etodolac (LODINE) 300 MG capsule Take 300 mg by mouth 3 (three) times daily.      . furosemide (LASIX) 20 MG tablet Take 20 mg by mouth daily.      Marland Kitchen letrozole (FEMARA) 2.5 MG tablet Take 1 tablet (2.5 mg total) by mouth daily.  90 tablet  4  . lisinopril (PRINIVIL,ZESTRIL) 10 MG tablet Take 10 mg by mouth daily.      . metFORMIN (GLUCOPHAGE) 500 MG tablet Take 500 mg by mouth 2 (two) times daily with a meal.      . Multiple Vitamins-Minerals (CENTRUM SILVER PO) Take 1 tablet by mouth daily.       No current facility-administered medications for this visit.    Review of Systems Review of Systems   Constitutional: Negative.   Respiratory: Negative.   Cardiovascular: Negative.     Blood pressure 154/78, pulse 72, resp. rate 14, height 5' 7.5" (1.715 m), weight 228 lb (103.42 kg).  Physical Exam Physical Exam  Constitutional: She is oriented to person, place, and time. She appears well-developed and well-nourished.  Pulmonary/Chest:  Seroma left chest.  Neurological: She is alert and oriented to person, place, and time.  Skin: Skin is warm and dry.    Data Reviewed Seroma fluid aspirated 175 ml. Aspirated after ChloraPrep skin cleansing.   Assessment    Post mastectomy seroma.     Plan    Follow up examination in one week.        [name] 05/07/2013, 8:12 PM

## 2013-05-11 ENCOUNTER — Encounter: Payer: Self-pay | Admitting: General Surgery

## 2013-05-11 ENCOUNTER — Ambulatory Visit (INDEPENDENT_AMBULATORY_CARE_PROVIDER_SITE_OTHER): Payer: Medicare Other | Admitting: General Surgery

## 2013-05-11 VITALS — BP 138/64 | HR 82 | Resp 18 | Ht 67.5 in | Wt 225.0 lb

## 2013-05-11 DIAGNOSIS — C50919 Malignant neoplasm of unspecified site of unspecified female breast: Secondary | ICD-10-CM

## 2013-05-11 DIAGNOSIS — T792XXD Traumatic secondary and recurrent hemorrhage and seroma, subsequent encounter: Secondary | ICD-10-CM

## 2013-05-11 NOTE — Patient Instructions (Signed)
Patient to return in 1 week.

## 2013-05-11 NOTE — Progress Notes (Signed)
Patient ID: Robin Ashley, female   DOB: 01-Nov-1931, 79 y.o.   MRN: [id number]  Chief Complaint  Patient presents with  . Follow-up    6 day follow up seroma post op left mastectomy site    HPI Robin Ashley is a 79 y.o. female who presents for a 6 day follow up seroma post op left mastectomy site. The patient denies any new problems at this time.   HPI  Past Medical History  Diagnosis Date  . Diabetes mellitus without complication   . Hypertension   . Hyperlipidemia   . Cancer 2014    breast    Past Surgical History  Procedure Laterality Date  . Intestinal balloon removed  1991  . Eye surgery Bilateral 04/2012  . Breast biopsy Left 02/25/13  . Breast surgery Left 03-15-13    left mastectomywith SN biopsy    Family History  Problem Relation Age of Onset  . Diabetes Brother     Social History History  Substance Use Topics  . Smoking status: Never Smoker   . Smokeless tobacco: Never Used  . Alcohol Use: No    No Known Allergies  Current Outpatient Prescriptions  Medication Sig Dispense Refill  . amLODipine (NORVASC) 10 MG tablet Take 10 mg by mouth daily.      Marland Kitchen atorvastatin (LIPITOR) 80 MG tablet Take 80 mg by mouth daily.      Marland Kitchen etodolac (LODINE) 300 MG capsule Take 300 mg by mouth 3 (three) times daily.      . furosemide (LASIX) 20 MG tablet Take 20 mg by mouth daily.      Marland Kitchen letrozole (FEMARA) 2.5 MG tablet Take 1 tablet (2.5 mg total) by mouth daily.  90 tablet  4  . lisinopril (PRINIVIL,ZESTRIL) 10 MG tablet Take 10 mg by mouth daily.      . metFORMIN (GLUCOPHAGE) 500 MG tablet Take 500 mg by mouth 2 (two) times daily with a meal.      . Multiple Vitamins-Minerals (CENTRUM SILVER PO) Take 1 tablet by mouth daily.       No current facility-administered medications for this visit.    Review of Systems Review of Systems  Constitutional: Negative.   Respiratory: Negative.   Cardiovascular: Negative.     Blood pressure 138/64, pulse 82, resp. rate 18,  height 5' 7.5" (1.715 m), weight 225 lb (102.059 kg).  Physical Exam Physical Exam  Constitutional: She is oriented to person, place, and time. She appears well-developed and well-nourished.  Pulmonary/Chest:  Recurrent seroma left mastectomy site. No erythema, induration or skin thickening. No pain. No limitation of shoulder motion.  120 ml's drained at today's visit.   Neurological: She is alert and oriented to person, place, and time.  Skin: Skin is warm and dry.    Data Reviewed None  Assessment    Recurrent seroma. Modest decrease in volume over the last week to    Plan    We'll continue weekly fluid aspirations. Reexamination in one week.       [name] 05/11/2013, 9:57 AM

## 2013-05-17 DIAGNOSIS — E119 Type 2 diabetes mellitus without complications: Secondary | ICD-10-CM | POA: Diagnosis not present

## 2013-05-17 DIAGNOSIS — I10 Essential (primary) hypertension: Secondary | ICD-10-CM | POA: Diagnosis not present

## 2013-05-17 DIAGNOSIS — F4320 Adjustment disorder, unspecified: Secondary | ICD-10-CM | POA: Diagnosis not present

## 2013-05-17 DIAGNOSIS — E7800 Pure hypercholesterolemia, unspecified: Secondary | ICD-10-CM | POA: Diagnosis not present

## 2013-05-17 DIAGNOSIS — Z0000 Encounter for general adult medical examination without abnormal findings: Secondary | ICD-10-CM | POA: Diagnosis not present

## 2013-05-17 DIAGNOSIS — D649 Anemia, unspecified: Secondary | ICD-10-CM | POA: Diagnosis not present

## 2013-05-19 ENCOUNTER — Ambulatory Visit (INDEPENDENT_AMBULATORY_CARE_PROVIDER_SITE_OTHER): Payer: Medicare Other | Admitting: General Surgery

## 2013-05-19 ENCOUNTER — Encounter: Payer: Self-pay | Admitting: General Surgery

## 2013-05-19 VITALS — BP 164/78 | HR 78 | Resp 16 | Ht 67.5 in | Wt 227.0 lb

## 2013-05-19 DIAGNOSIS — T792XXD Traumatic secondary and recurrent hemorrhage and seroma, subsequent encounter: Secondary | ICD-10-CM

## 2013-05-19 DIAGNOSIS — C50919 Malignant neoplasm of unspecified site of unspecified female breast: Secondary | ICD-10-CM

## 2013-05-19 NOTE — Patient Instructions (Signed)
Patient to return on Tuesday December 23 for follow up .

## 2013-05-19 NOTE — Progress Notes (Signed)
Patient ID: Robin Ashley, female   DOB: Apr 03, 1932, 79 y.o.   MRN: [id number]  Chief Complaint  Patient presents with  . Follow-up    1 week follow up seroma post op left mastectomy    HPI Robin Ashley is a 79 y.o. female who presents for a 1 week follow up seroma post op left mastectomy site. The patient states she feels she has fluid build up at this time.    HPI  Past Medical History  Diagnosis Date  . Diabetes mellitus without complication   . Hypertension   . Hyperlipidemia   . Cancer 2014    breast    Past Surgical History  Procedure Laterality Date  . Intestinal balloon removed  1991  . Eye surgery Bilateral 04/2012  . Breast biopsy Left 02/25/13  . Breast surgery Left 03-15-13    left mastectomywith SN biopsy    Family History  Problem Relation Age of Onset  . Diabetes Brother     Social History History  Substance Use Topics  . Smoking status: Never Smoker   . Smokeless tobacco: Never Used  . Alcohol Use: No    No Known Allergies  Current Outpatient Prescriptions  Medication Sig Dispense Refill  . amLODipine (NORVASC) 10 MG tablet Take 10 mg by mouth daily.      Marland Kitchen atorvastatin (LIPITOR) 80 MG tablet Take 80 mg by mouth daily.      Marland Kitchen etodolac (LODINE) 300 MG capsule Take 300 mg by mouth 3 (three) times daily.      . furosemide (LASIX) 20 MG tablet Take 20 mg by mouth daily.      Marland Kitchen letrozole (FEMARA) 2.5 MG tablet Take 1 tablet (2.5 mg total) by mouth daily.  90 tablet  4  . lisinopril (PRINIVIL,ZESTRIL) 10 MG tablet Take 10 mg by mouth daily.      . metFORMIN (GLUCOPHAGE) 500 MG tablet Take 500 mg by mouth 2 (two) times daily with a meal.      . Multiple Vitamins-Minerals (CENTRUM SILVER PO) Take 1 tablet by mouth daily.       No current facility-administered medications for this visit.    Review of Systems Review of Systems  Constitutional: Negative.   Respiratory: Negative.   Cardiovascular: Negative.     Blood pressure 164/78, pulse 78,  resp. rate 16, height 5' 7.5" (1.715 m), weight 227 lb (102.967 kg).  Physical Exam Physical Exam  Constitutional: She is oriented to person, place, and time. She appears well-developed and well-nourished.  Pulmonary/Chest:  170 ml's of fluid drained from left mastectomy site.   Neurological: She is alert and oriented to person, place, and time.  Skin: Skin is warm and dry.     Assessment    Recurrent seroma of the mastectomy site.    Plan    Will arrange for a repeat exam in one week. If a sizable fluid collection is again present we'll need to consider placement of a new seroma catheter.       Robin Ashley 05/22/2013, 10:23 AM

## 2013-05-24 DIAGNOSIS — S8263XA Displaced fracture of lateral malleolus of unspecified fibula, initial encounter for closed fracture: Secondary | ICD-10-CM | POA: Diagnosis not present

## 2013-05-24 DIAGNOSIS — G609 Hereditary and idiopathic neuropathy, unspecified: Secondary | ICD-10-CM | POA: Diagnosis not present

## 2013-05-25 ENCOUNTER — Ambulatory Visit (INDEPENDENT_AMBULATORY_CARE_PROVIDER_SITE_OTHER): Payer: Medicare Other | Admitting: General Surgery

## 2013-05-25 ENCOUNTER — Encounter: Payer: Self-pay | Admitting: General Surgery

## 2013-05-25 VITALS — BP 148/64 | HR 88 | Resp 14 | Ht 67.0 in | Wt 234.0 lb

## 2013-05-25 DIAGNOSIS — C50919 Malignant neoplasm of unspecified site of unspecified female breast: Secondary | ICD-10-CM

## 2013-05-25 DIAGNOSIS — T792XXD Traumatic secondary and recurrent hemorrhage and seroma, subsequent encounter: Secondary | ICD-10-CM

## 2013-05-25 NOTE — Progress Notes (Signed)
aPatient ID: Robin Ashley, female   DOB: 1932-03-16, 79 y.o.   MRN: [id number]  Chief Complaint  Patient presents with  . Routine Post Op    left mastectomy    HPI Robin Ashley is a 79 y.o. female female who presents for a 1 week follow up seroma post op left mastectomy site/ Patient had her right lower extremity boot removed yesterday by the orthopedic service.  HPI  Past Medical History  Diagnosis Date  . Diabetes mellitus without complication   . Hypertension   . Hyperlipidemia   . Cancer 2014    breast    Past Surgical History  Procedure Laterality Date  . Intestinal balloon removed  1991  . Eye surgery Bilateral 04/2012  . Breast biopsy Left 02/25/13  . Breast surgery Left 03-15-13    left mastectomywith SN biopsy    Family History  Problem Relation Age of Onset  . Diabetes Brother     Social History History  Substance Use Topics  . Smoking status: Never Smoker   . Smokeless tobacco: Never Used  . Alcohol Use: No    No Known Allergies  Current Outpatient Prescriptions  Medication Sig Dispense Refill  . amLODipine (NORVASC) 10 MG tablet Take 10 mg by mouth daily.      Marland Kitchen atorvastatin (LIPITOR) 80 MG tablet Take 80 mg by mouth daily.      Marland Kitchen etodolac (LODINE) 300 MG capsule Take 300 mg by mouth 3 (three) times daily.      . furosemide (LASIX) 20 MG tablet Take 20 mg by mouth daily.      Marland Kitchen letrozole (FEMARA) 2.5 MG tablet Take 1 tablet (2.5 mg total) by mouth daily.  90 tablet  4  . lisinopril (PRINIVIL,ZESTRIL) 10 MG tablet Take 10 mg by mouth daily.      . meloxicam (MOBIC) 15 MG tablet Take 15 mg by mouth daily.      . metFORMIN (GLUCOPHAGE) 500 MG tablet Take 500 mg by mouth 2 (two) times daily with a meal.      . Multiple Vitamins-Minerals (CENTRUM SILVER PO) Take 1 tablet by mouth daily.       No current facility-administered medications for this visit.    Review of Systems Review of Systems  Constitutional: Negative.   Respiratory: Negative.    Cardiovascular: Negative.     Blood pressure 148/64, pulse 88, resp. rate 14, height 5\' 7"  (1.702 m), weight 234 lb (106.142 kg).  Physical Exam Physical Exam  Constitutional: She is oriented to person, place, and time. She appears well-developed and well-nourished.  Eyes: No scleral icterus.  Pulmonary/Chest:  Left mastectomy site showed a recurrent fluid accumulation.a seroma catheter was placed after ChloraPrep skin application, 1 cc of 1% plain Xylocaine. 120 cc obtained. Gauze and Tegaderm dressing applied. Procedure was well-tolerated.  Lymphadenopathy:    She has no cervical adenopathy.  Neurological: She is alert and oriented to person, place, and time.  Skin: Skin is warm and dry.      Assessment    Persistent left mastectomy site seroma.     Plan    We'll plan for a followup examination in one week. The patient was reassured that the presence of the seroma is unrelated to the primary malignancy.        [name] 05/25/2013, 8:01 PM

## 2013-05-25 NOTE — Patient Instructions (Signed)
Patient to return one week  

## 2013-06-02 ENCOUNTER — Ambulatory Visit (INDEPENDENT_AMBULATORY_CARE_PROVIDER_SITE_OTHER): Payer: Medicare Other | Admitting: General Surgery

## 2013-06-02 VITALS — BP 144/78 | HR 80 | Resp 16 | Ht 67.0 in | Wt 227.0 lb

## 2013-06-02 DIAGNOSIS — T792XXD Traumatic secondary and recurrent hemorrhage and seroma, subsequent encounter: Secondary | ICD-10-CM

## 2013-06-02 DIAGNOSIS — C50919 Malignant neoplasm of unspecified site of unspecified female breast: Secondary | ICD-10-CM

## 2013-06-02 NOTE — Progress Notes (Signed)
Patient ID: Robin Ashley, female   DOB: 1932/01/09, 79 y.o.   MRN: [id number]  Chief Complaint  Patient presents with  . Routine Post Op    left mastectomy    HPI Robin Ashley is a 79 y.o. female  who presents for a  follow up seroma post op left mastectomy site.   HPI  Past Medical History  Diagnosis Date  . Diabetes mellitus without complication   . Hypertension   . Hyperlipidemia   . Cancer 2014    breast    Past Surgical History  Procedure Laterality Date  . Intestinal balloon removed  1991  . Eye surgery Bilateral 04/2012  . Breast biopsy Left 02/25/13  . Breast surgery Left 03-15-13    left mastectomywith SN biopsy    Family History  Problem Relation Age of Onset  . Diabetes Brother     Social History History  Substance Use Topics  . Smoking status: Never Smoker   . Smokeless tobacco: Never Used  . Alcohol Use: No    No Known Allergies  Current Outpatient Prescriptions  Medication Sig Dispense Refill  . amLODipine (NORVASC) 10 MG tablet Take 10 mg by mouth daily.      Marland Kitchen atorvastatin (LIPITOR) 80 MG tablet Take 80 mg by mouth daily.      Marland Kitchen etodolac (LODINE) 300 MG capsule Take 300 mg by mouth 3 (three) times daily.      . furosemide (LASIX) 20 MG tablet Take 20 mg by mouth daily.      Marland Kitchen letrozole (FEMARA) 2.5 MG tablet Take 1 tablet (2.5 mg total) by mouth daily.  90 tablet  4  . lisinopril (PRINIVIL,ZESTRIL) 10 MG tablet Take 10 mg by mouth daily.      . meloxicam (MOBIC) 15 MG tablet Take 15 mg by mouth daily.      . metFORMIN (GLUCOPHAGE) 500 MG tablet Take 500 mg by mouth 2 (two) times daily with a meal.      . Multiple Vitamins-Minerals (CENTRUM SILVER PO) Take 1 tablet by mouth daily.       No current facility-administered medications for this visit.    Review of Systems Review of Systems  Constitutional: Negative.   Respiratory: Negative.   Cardiovascular: Negative.     Blood pressure 144/78, pulse 80, resp. rate 16, height 5\' 7"   (1.702 m), weight 227 lb (102.967 kg).  Physical Exam Physical Exam  Constitutional: She is oriented to person, place, and time. She appears well-developed and well-nourished.  Eyes: No scleral icterus.  Pulmonary/Chest:  There is no evidence of loculated fluid in the left mastectomy site. The seroma catheter site showed no evidence of infection. A new gauze and Tegaderm dressing was applied. Drainage has been 45 cc over the last 48 hours. As this is her second catheter due reaccumulation, we'll leave at another week.  Lymphadenopathy:    She has no cervical adenopathy.  Neurological: She is alert and oriented to person, place, and time.  Skin: Skin is warm and dry.       Assessment    Post mastectomy seroma.     Plan    Follow up exam in one week.        [name] 06/04/2013, 7:46 PM

## 2013-06-02 NOTE — Patient Instructions (Signed)
Patient to return one week  

## 2013-06-08 ENCOUNTER — Ambulatory Visit: Payer: Medicare Other | Admitting: General Surgery

## 2013-06-08 DIAGNOSIS — M216X9 Other acquired deformities of unspecified foot: Secondary | ICD-10-CM | POA: Diagnosis not present

## 2013-06-08 DIAGNOSIS — M25676 Stiffness of unspecified foot, not elsewhere classified: Secondary | ICD-10-CM | POA: Diagnosis not present

## 2013-06-08 DIAGNOSIS — M6281 Muscle weakness (generalized): Secondary | ICD-10-CM | POA: Diagnosis not present

## 2013-06-08 DIAGNOSIS — M25673 Stiffness of unspecified ankle, not elsewhere classified: Secondary | ICD-10-CM | POA: Diagnosis not present

## 2013-06-08 DIAGNOSIS — S8263XA Displaced fracture of lateral malleolus of unspecified fibula, initial encounter for closed fracture: Secondary | ICD-10-CM | POA: Diagnosis not present

## 2013-06-10 ENCOUNTER — Encounter: Payer: Self-pay | Admitting: General Surgery

## 2013-06-10 ENCOUNTER — Ambulatory Visit: Payer: Medicare Other | Admitting: General Surgery

## 2013-06-10 VITALS — BP 154/78 | HR 90 | Resp 16 | Ht 67.5 in | Wt 227.0 lb

## 2013-06-10 DIAGNOSIS — T888XXD Other specified complications of surgical and medical care, not elsewhere classified, subsequent encounter: Principal | ICD-10-CM

## 2013-06-10 DIAGNOSIS — T792XXD Traumatic secondary and recurrent hemorrhage and seroma, subsequent encounter: Secondary | ICD-10-CM

## 2013-06-10 NOTE — Patient Instructions (Signed)
Minimal use of left arm this week

## 2013-06-10 NOTE — Progress Notes (Signed)
Patient ID: Robin Ashley, female   DOB: 09/09/1931, 80 y.o.   MRN: [id number]  Chief Complaint  Patient presents with  . Follow-up    seroma    HPI Robin Ashley is a 80 y.o. female.  who presents for a follow up seroma post op left mastectomy site. The drain remains in place.   HPI  Past Medical History  Diagnosis Date  . Diabetes mellitus without complication   . Hypertension   . Hyperlipidemia   . Cancer 2014    breast    Past Surgical History  Procedure Laterality Date  . Intestinal balloon removed  1991  . Eye surgery Bilateral 04/2012  . Breast biopsy Left 02/25/13  . Breast surgery Left 03-15-13    left mastectomywith SN biopsy    Family History  Problem Relation Age of Onset  . Diabetes Brother     Social History History  Substance Use Topics  . Smoking status: Never Smoker   . Smokeless tobacco: Never Used  . Alcohol Use: No    No Known Allergies  Current Outpatient Prescriptions  Medication Sig Dispense Refill  . amLODipine (NORVASC) 10 MG tablet Take 10 mg by mouth daily.      Marland Kitchen atorvastatin (LIPITOR) 80 MG tablet Take 80 mg by mouth daily.      Marland Kitchen etodolac (LODINE) 300 MG capsule Take 300 mg by mouth 3 (three) times daily.      . furosemide (LASIX) 20 MG tablet Take 20 mg by mouth daily.      Marland Kitchen letrozole (FEMARA) 2.5 MG tablet Take 1 tablet (2.5 mg total) by mouth daily.  90 tablet  4  . lisinopril (PRINIVIL,ZESTRIL) 10 MG tablet Take 10 mg by mouth daily.      . meloxicam (MOBIC) 15 MG tablet Take 15 mg by mouth daily.      . metFORMIN (GLUCOPHAGE) 500 MG tablet Take 500 mg by mouth 2 (two) times daily with a meal.      . Multiple Vitamins-Minerals (CENTRUM SILVER PO) Take 1 tablet by mouth daily.       No current facility-administered medications for this visit.    Review of Systems Review of Systems  Constitutional: Negative.   Respiratory: Negative.   Cardiovascular: Negative.     Blood pressure 154/78, pulse 90, resp. rate 16,  height 5' 7.5" (1.715 m), weight 227 lb (102.967 kg).  Physical Exam Physical Exam  Constitutional: She is oriented to person, place, and time. She appears well-developed and well-nourished.  Neck: Neck supple.  Pulmonary/Chest:  Drain removed.  Lymphadenopathy:    She has no cervical adenopathy.  Neurological: She is alert and oriented to person, place, and time.  Skin: Skin is warm and dry.   Left mastectomy site shows no loculated fluid or erythema.  Left shoulder range of motion is decreased at about 90 abduction.  Data Reviewed Drain record shows volume is less than 30 cc per day for the last week. Drain site clear. Eye and removed.  Assessment    Post mastectomy seroma, resolving.    Plan    The patient was discouraged from strenuous or vigorous repetitive activity involving the left upper extremity. Followup examination in one week.       Robert Bellow 06/10/2013, 10:46 AM

## 2013-06-11 DIAGNOSIS — M6281 Muscle weakness (generalized): Secondary | ICD-10-CM | POA: Diagnosis not present

## 2013-06-11 DIAGNOSIS — R269 Unspecified abnormalities of gait and mobility: Secondary | ICD-10-CM | POA: Diagnosis not present

## 2013-06-11 DIAGNOSIS — S8263XA Displaced fracture of lateral malleolus of unspecified fibula, initial encounter for closed fracture: Secondary | ICD-10-CM | POA: Diagnosis not present

## 2013-06-11 DIAGNOSIS — M216X9 Other acquired deformities of unspecified foot: Secondary | ICD-10-CM | POA: Diagnosis not present

## 2013-06-17 ENCOUNTER — Encounter: Payer: Self-pay | Admitting: General Surgery

## 2013-06-17 ENCOUNTER — Ambulatory Visit (INDEPENDENT_AMBULATORY_CARE_PROVIDER_SITE_OTHER): Payer: Medicare Other | Admitting: General Surgery

## 2013-06-17 VITALS — BP 140/72 | HR 70 | Resp 14 | Ht 67.0 in | Wt 223.0 lb

## 2013-06-17 DIAGNOSIS — R269 Unspecified abnormalities of gait and mobility: Secondary | ICD-10-CM | POA: Diagnosis not present

## 2013-06-17 DIAGNOSIS — C50912 Malignant neoplasm of unspecified site of left female breast: Secondary | ICD-10-CM

## 2013-06-17 DIAGNOSIS — S8263XA Displaced fracture of lateral malleolus of unspecified fibula, initial encounter for closed fracture: Secondary | ICD-10-CM | POA: Diagnosis not present

## 2013-06-17 DIAGNOSIS — M6281 Muscle weakness (generalized): Secondary | ICD-10-CM | POA: Diagnosis not present

## 2013-06-17 DIAGNOSIS — C50919 Malignant neoplasm of unspecified site of unspecified female breast: Secondary | ICD-10-CM

## 2013-06-17 DIAGNOSIS — M216X9 Other acquired deformities of unspecified foot: Secondary | ICD-10-CM | POA: Diagnosis not present

## 2013-06-17 NOTE — Progress Notes (Signed)
Patient ID: Robin Ashley, female   DOB: [birth year], 80 y.o.   MRN: [id number]  Chief Complaint  Patient presents with  . Follow-up    seroma    HPI Robin Ashley is a 80 y.o. female who presents for a follow up seroma post op left mastectomy site. The drain remains was removed on 06/10/13. Patient states she is doing well.     HPI  Past Medical History  Diagnosis Date  . Diabetes mellitus without complication   . Hypertension   . Hyperlipidemia   . Cancer 2014    breast    Past Surgical History  Procedure Laterality Date  . Intestinal balloon removed  1991  . Eye surgery Bilateral 04/2012  . Breast biopsy Left 02/25/13  . Breast surgery Left 03-15-13    left mastectomywith SN biopsy    Family History  Problem Relation Age of Onset  . Diabetes Brother     Social History History  Substance Use Topics  . Smoking status: Never Smoker   . Smokeless tobacco: Never Used  . Alcohol Use: No    No Known Allergies  Current Outpatient Prescriptions  Medication Sig Dispense Refill  . amLODipine (NORVASC) 10 MG tablet Take 10 mg by mouth daily.      Marland Kitchen atorvastatin (LIPITOR) 80 MG tablet Take 80 mg by mouth daily.      Marland Kitchen etodolac (LODINE) 300 MG capsule Take 300 mg by mouth 3 (three) times daily.      . furosemide (LASIX) 20 MG tablet Take 20 mg by mouth daily.      Marland Kitchen letrozole (FEMARA) 2.5 MG tablet Take 1 tablet (2.5 mg total) by mouth daily.  90 tablet  4  . lisinopril (PRINIVIL,ZESTRIL) 10 MG tablet Take 10 mg by mouth daily.      . meloxicam (MOBIC) 15 MG tablet Take 15 mg by mouth daily.      . metFORMIN (GLUCOPHAGE) 500 MG tablet Take 500 mg by mouth 2 (two) times daily with a meal.      . Multiple Vitamins-Minerals (CENTRUM SILVER PO) Take 1 tablet by mouth daily.       No current facility-administered medications for this visit.    Review of Systems Review of Systems  Constitutional: Negative.   Respiratory: Negative.   Cardiovascular: Negative.      Blood pressure 140/72, pulse 70, resp. rate 14, height 5\' 7"  (1.702 m), weight 223 lb (101.152 kg).  Physical Exam Physical Exam  Constitutional: She is oriented to person, place, and time. She appears well-nourished.  Eyes: Conjunctivae are normal. No scleral icterus.  Neck: Neck supple.  Pulmonary/Chest:  Left mastectomy site healing well.  No evidence of recurrent seroma formation. Good shoulder range of motion.  Neurological: She is alert and oriented to person, place, and time.  Skin: Skin is warm and dry.    Data Reviewed No new data.  Assessment    Resolution of post mastectomy seroma.    Plan    The patient is tolerating Femara without ill effect. She'll increase her activity as tolerated. We'll plan for followup examination in one month. She was encouraged to call earlier if she appreciate more swelling. A prescription for a left breast prosthesis and surgical bras x6 was provided.       Robert Bellow 06/17/2013, 9:48 AM

## 2013-06-17 NOTE — Patient Instructions (Signed)
Patient to return in one month. Rx given

## 2013-06-23 DIAGNOSIS — M6281 Muscle weakness (generalized): Secondary | ICD-10-CM | POA: Diagnosis not present

## 2013-06-23 DIAGNOSIS — S8263XA Displaced fracture of lateral malleolus of unspecified fibula, initial encounter for closed fracture: Secondary | ICD-10-CM | POA: Diagnosis not present

## 2013-06-23 DIAGNOSIS — M216X9 Other acquired deformities of unspecified foot: Secondary | ICD-10-CM | POA: Diagnosis not present

## 2013-06-23 DIAGNOSIS — R269 Unspecified abnormalities of gait and mobility: Secondary | ICD-10-CM | POA: Diagnosis not present

## 2013-06-25 DIAGNOSIS — S8263XA Displaced fracture of lateral malleolus of unspecified fibula, initial encounter for closed fracture: Secondary | ICD-10-CM | POA: Diagnosis not present

## 2013-06-25 DIAGNOSIS — M216X9 Other acquired deformities of unspecified foot: Secondary | ICD-10-CM | POA: Diagnosis not present

## 2013-06-25 DIAGNOSIS — R269 Unspecified abnormalities of gait and mobility: Secondary | ICD-10-CM | POA: Diagnosis not present

## 2013-06-25 DIAGNOSIS — M6281 Muscle weakness (generalized): Secondary | ICD-10-CM | POA: Diagnosis not present

## 2013-06-29 DIAGNOSIS — R269 Unspecified abnormalities of gait and mobility: Secondary | ICD-10-CM | POA: Diagnosis not present

## 2013-06-29 DIAGNOSIS — M216X9 Other acquired deformities of unspecified foot: Secondary | ICD-10-CM | POA: Diagnosis not present

## 2013-06-29 DIAGNOSIS — M6281 Muscle weakness (generalized): Secondary | ICD-10-CM | POA: Diagnosis not present

## 2013-06-29 DIAGNOSIS — S8263XA Displaced fracture of lateral malleolus of unspecified fibula, initial encounter for closed fracture: Secondary | ICD-10-CM | POA: Diagnosis not present

## 2013-07-01 DIAGNOSIS — M216X9 Other acquired deformities of unspecified foot: Secondary | ICD-10-CM | POA: Diagnosis not present

## 2013-07-01 DIAGNOSIS — S8263XA Displaced fracture of lateral malleolus of unspecified fibula, initial encounter for closed fracture: Secondary | ICD-10-CM | POA: Diagnosis not present

## 2013-07-01 DIAGNOSIS — M6281 Muscle weakness (generalized): Secondary | ICD-10-CM | POA: Diagnosis not present

## 2013-07-01 DIAGNOSIS — R269 Unspecified abnormalities of gait and mobility: Secondary | ICD-10-CM | POA: Diagnosis not present

## 2013-07-07 DIAGNOSIS — S8263XA Displaced fracture of lateral malleolus of unspecified fibula, initial encounter for closed fracture: Secondary | ICD-10-CM | POA: Diagnosis not present

## 2013-07-07 DIAGNOSIS — M6281 Muscle weakness (generalized): Secondary | ICD-10-CM | POA: Diagnosis not present

## 2013-07-20 ENCOUNTER — Ambulatory Visit: Payer: Medicare Other | Admitting: General Surgery

## 2013-07-22 ENCOUNTER — Encounter: Payer: Self-pay | Admitting: General Surgery

## 2013-07-22 ENCOUNTER — Ambulatory Visit (INDEPENDENT_AMBULATORY_CARE_PROVIDER_SITE_OTHER): Payer: Medicare Other | Admitting: General Surgery

## 2013-07-22 VITALS — BP 130/80 | HR 88 | Resp 16 | Ht 67.5 in | Wt 221.0 lb

## 2013-07-22 DIAGNOSIS — C50919 Malignant neoplasm of unspecified site of unspecified female breast: Secondary | ICD-10-CM

## 2013-07-22 DIAGNOSIS — C50912 Malignant neoplasm of unspecified site of left female breast: Secondary | ICD-10-CM

## 2013-07-22 NOTE — Patient Instructions (Addendum)
Patient advised to continue arm exercises. Patient to return in September right breast mammogram.

## 2013-07-22 NOTE — Progress Notes (Signed)
Patient ID: Robin Ashley, female   DOB: March 24, 1932, 80 y.o.   MRN: [id number]  Chief Complaint  Patient presents with  . Follow-up    1 month follow up breast cancer no mammogram    HPI Robin Ashley is a 80 y.o. female who presents for a 5 month follow up left breast cancer. No mammogram done at this time. No new problems or complaints at this time.   HPI  Past Medical History  Diagnosis Date  . Diabetes mellitus without complication   . Hypertension   . Hyperlipidemia   . Cancer 2014    breast    Past Surgical History  Procedure Laterality Date  . Intestinal balloon removed  1991  . Eye surgery Bilateral 04/2012  . Breast biopsy Left 02/25/13  . Breast surgery Left 03-15-13    left mastectomywith SN biopsy    Family History  Problem Relation Age of Onset  . Diabetes Brother     Social History History  Substance Use Topics  . Smoking status: Never Smoker   . Smokeless tobacco: Never Used  . Alcohol Use: No    No Known Allergies  Current Outpatient Prescriptions  Medication Sig Dispense Refill  . amLODipine (NORVASC) 10 MG tablet Take 10 mg by mouth daily.      Marland Kitchen atorvastatin (LIPITOR) 80 MG tablet Take 80 mg by mouth daily.      Marland Kitchen etodolac (LODINE) 300 MG capsule Take 300 mg by mouth 3 (three) times daily.      . furosemide (LASIX) 20 MG tablet Take 20 mg by mouth daily.      Marland Kitchen letrozole (FEMARA) 2.5 MG tablet Take 1 tablet (2.5 mg total) by mouth daily.  90 tablet  4  . lisinopril (PRINIVIL,ZESTRIL) 10 MG tablet Take 10 mg by mouth daily.      . meloxicam (MOBIC) 15 MG tablet Take 15 mg by mouth daily.      . metFORMIN (GLUCOPHAGE) 500 MG tablet Take 500 mg by mouth 2 (two) times daily with a meal.      . Multiple Vitamins-Minerals (CENTRUM SILVER PO) Take 1 tablet by mouth daily.       No current facility-administered medications for this visit.    Review of Systems Review of Systems  Constitutional: Negative.   Respiratory: Negative.    Cardiovascular: Negative.     Blood pressure 130/80, pulse 88, resp. rate 16, height 5' 7.5" (1.715 m), weight 221 lb (100.245 kg).  Physical Exam Physical Exam  Constitutional: She is oriented to person, place, and time. She appears well-developed and well-nourished.  Pulmonary/Chest: Right breast exhibits no inverted nipple, no mass, no nipple discharge, no skin change and no tenderness.  Well healed left mastectomy site.   Lymphadenopathy:    She has no cervical adenopathy.    She has no axillary adenopathy.  Neurological: She is alert and oriented to person, place, and time.  Skin: Skin is warm and dry.  Modest decreased left shoulder ROM.     Assessment    No evidence of recurrent seroma. Tolerating letrozole therapy without ill effect.    Plan    We'll plan for a followup examination in right breast screening mammogram in September 2015.  The patient was encouraged to work on her shoulder range of motion.       Robert Bellow 07/23/2013, 11:58 AM

## 2013-08-03 DIAGNOSIS — H61009 Unspecified perichondritis of external ear, unspecified ear: Secondary | ICD-10-CM | POA: Diagnosis not present

## 2013-08-10 DIAGNOSIS — G4730 Sleep apnea, unspecified: Secondary | ICD-10-CM | POA: Diagnosis not present

## 2013-08-10 DIAGNOSIS — E782 Mixed hyperlipidemia: Secondary | ICD-10-CM | POA: Diagnosis not present

## 2013-08-10 DIAGNOSIS — I10 Essential (primary) hypertension: Secondary | ICD-10-CM | POA: Diagnosis not present

## 2013-08-16 DIAGNOSIS — H6190 Disorder of external ear, unspecified, unspecified ear: Secondary | ICD-10-CM | POA: Diagnosis not present

## 2013-08-16 DIAGNOSIS — Z0000 Encounter for general adult medical examination without abnormal findings: Secondary | ICD-10-CM | POA: Diagnosis not present

## 2013-08-16 DIAGNOSIS — G4700 Insomnia, unspecified: Secondary | ICD-10-CM | POA: Diagnosis not present

## 2013-08-16 DIAGNOSIS — F4320 Adjustment disorder, unspecified: Secondary | ICD-10-CM | POA: Diagnosis not present

## 2013-08-16 DIAGNOSIS — E119 Type 2 diabetes mellitus without complications: Secondary | ICD-10-CM | POA: Diagnosis not present

## 2013-08-30 DIAGNOSIS — H61009 Unspecified perichondritis of external ear, unspecified ear: Secondary | ICD-10-CM | POA: Diagnosis not present

## 2013-08-30 DIAGNOSIS — L989 Disorder of the skin and subcutaneous tissue, unspecified: Secondary | ICD-10-CM | POA: Diagnosis not present

## 2013-09-20 DIAGNOSIS — H61009 Unspecified perichondritis of external ear, unspecified ear: Secondary | ICD-10-CM | POA: Diagnosis not present

## 2013-09-20 DIAGNOSIS — J019 Acute sinusitis, unspecified: Secondary | ICD-10-CM | POA: Diagnosis not present

## 2013-11-08 DIAGNOSIS — I10 Essential (primary) hypertension: Secondary | ICD-10-CM | POA: Diagnosis not present

## 2013-11-08 DIAGNOSIS — M1990 Unspecified osteoarthritis, unspecified site: Secondary | ICD-10-CM | POA: Diagnosis not present

## 2013-11-08 DIAGNOSIS — Z0000 Encounter for general adult medical examination without abnormal findings: Secondary | ICD-10-CM | POA: Diagnosis not present

## 2013-11-08 DIAGNOSIS — H6190 Disorder of external ear, unspecified, unspecified ear: Secondary | ICD-10-CM | POA: Diagnosis not present

## 2013-11-08 DIAGNOSIS — G4700 Insomnia, unspecified: Secondary | ICD-10-CM | POA: Diagnosis not present

## 2013-11-12 DIAGNOSIS — G5600 Carpal tunnel syndrome, unspecified upper limb: Secondary | ICD-10-CM | POA: Diagnosis not present

## 2013-11-17 DIAGNOSIS — H61039 Chondritis of external ear, unspecified ear: Secondary | ICD-10-CM | POA: Diagnosis not present

## 2013-11-26 DIAGNOSIS — G5600 Carpal tunnel syndrome, unspecified upper limb: Secondary | ICD-10-CM | POA: Diagnosis not present

## 2013-12-01 DIAGNOSIS — H61009 Unspecified perichondritis of external ear, unspecified ear: Secondary | ICD-10-CM | POA: Diagnosis not present

## 2013-12-01 DIAGNOSIS — H9209 Otalgia, unspecified ear: Secondary | ICD-10-CM | POA: Diagnosis not present

## 2014-02-08 ENCOUNTER — Ambulatory Visit: Payer: Self-pay | Admitting: General Surgery

## 2014-02-08 ENCOUNTER — Encounter: Payer: Self-pay | Admitting: General Surgery

## 2014-02-08 DIAGNOSIS — Z853 Personal history of malignant neoplasm of breast: Secondary | ICD-10-CM | POA: Diagnosis not present

## 2014-02-08 DIAGNOSIS — Z1231 Encounter for screening mammogram for malignant neoplasm of breast: Secondary | ICD-10-CM | POA: Diagnosis not present

## 2014-02-16 DIAGNOSIS — E785 Hyperlipidemia, unspecified: Secondary | ICD-10-CM | POA: Diagnosis not present

## 2014-02-16 DIAGNOSIS — I10 Essential (primary) hypertension: Secondary | ICD-10-CM | POA: Diagnosis not present

## 2014-02-16 DIAGNOSIS — E119 Type 2 diabetes mellitus without complications: Secondary | ICD-10-CM | POA: Diagnosis not present

## 2014-02-16 DIAGNOSIS — G4730 Sleep apnea, unspecified: Secondary | ICD-10-CM | POA: Diagnosis not present

## 2014-02-21 ENCOUNTER — Encounter: Payer: Self-pay | Admitting: General Surgery

## 2014-02-21 ENCOUNTER — Ambulatory Visit (INDEPENDENT_AMBULATORY_CARE_PROVIDER_SITE_OTHER): Payer: Medicare Other | Admitting: General Surgery

## 2014-02-21 VITALS — BP 142/72 | HR 70 | Resp 14 | Ht 67.0 in | Wt 219.0 lb

## 2014-02-21 DIAGNOSIS — C50919 Malignant neoplasm of unspecified site of unspecified female breast: Secondary | ICD-10-CM

## 2014-02-21 DIAGNOSIS — R609 Edema, unspecified: Secondary | ICD-10-CM | POA: Diagnosis not present

## 2014-02-21 DIAGNOSIS — C50912 Malignant neoplasm of unspecified site of left female breast: Secondary | ICD-10-CM

## 2014-02-21 DIAGNOSIS — R600 Localized edema: Secondary | ICD-10-CM

## 2014-02-21 NOTE — Patient Instructions (Addendum)
Patient to return in one year right breast diagnostic mammogram.  Patient to call us in a month and let us know how she is doing with the stockings.  Patient to have a bone density test at [facility] on 04-14-14 at 10 am.

## 2014-02-21 NOTE — Progress Notes (Signed)
Patient ID: Robin Ashley, female   DOB: 1932-02-12, 80 y.o.   MRN: [id number]  Chief Complaint  Patient presents with  . Follow-up    mammogram    HPI Robin Ashley is a 80 y.o. female who presents for a breast evaluation. The most recent right breast  mammogram was done on 02/08/14 .  Patient does perform regular self breast checks and gets regular mammograms done. She states her legs are swelling daily.  The patient has noted some mild bilateral lower extremity ankle edema, right greater than left. She is not appreciate any change in her exercise tolerance, nor appreciated any chest pain or dyspnea on exertion.  HPI  Past Medical History  Diagnosis Date  . Diabetes mellitus without complication   . Hypertension   . Hyperlipidemia   . Foot drop, right October 2014    noted post mastectomy, conservative treatment was instituted with resolution, mild edema.  . 174.06 March 2013    Right breast, T1c, N0, 12 mm; ER PR positive, HER-2/neu not over expressed. Not a candidate for adjuvant chemotherapy per [facility] tumor board.    Past Surgical History  Procedure Laterality Date  . Intestinal balloon removed  1991  . Eye surgery Bilateral 04/2012  . Breast biopsy Left 02/25/13  . Breast surgery Left 03-15-13    left mastectomywith SN biopsy    Family History  Problem Relation Age of Onset  . Diabetes Brother     Social History History  Substance Use Topics  . Smoking status: Never Smoker   . Smokeless tobacco: Never Used  . Alcohol Use: No    No Known Allergies  Current Outpatient Prescriptions  Medication Sig Dispense Refill  . amLODipine (NORVASC) 10 MG tablet Take 10 mg by mouth daily.      Marland Kitchen atorvastatin (LIPITOR) 80 MG tablet Take 80 mg by mouth daily.      Marland Kitchen etodolac (LODINE) 300 MG capsule Take 300 mg by mouth 3 (three) times daily.      . furosemide (LASIX) 20 MG tablet Take 20 mg by mouth daily.      Marland Kitchen letrozole (FEMARA) 2.5 MG tablet Take 1 tablet (2.5 mg  total) by mouth daily.  90 tablet  4  . lisinopril (PRINIVIL,ZESTRIL) 10 MG tablet Take 10 mg by mouth daily.      . metFORMIN (GLUCOPHAGE) 500 MG tablet Take 500 mg by mouth 2 (two) times daily with a meal.      . Multiple Vitamins-Minerals (CENTRUM SILVER PO) Take 1 tablet by mouth daily.       No current facility-administered medications for this visit.    Review of Systems Review of Systems  Constitutional: Negative.   Respiratory: Negative.   Cardiovascular: Negative.     Blood pressure 142/72, pulse 70, resp. rate 14, height 5' 7"  (1.702 m), weight 219 lb (99.338 kg).  Physical Exam Physical Exam  Constitutional: She is oriented to person, place, and time. She appears well-developed and well-nourished.  Eyes: Conjunctivae are normal. No scleral icterus.  Neck: Neck supple.  Cardiovascular: Normal rate, regular rhythm and normal heart sounds.   Pulmonary/Chest: Effort normal and breath sounds normal. Right breast exhibits no inverted nipple, no mass, no nipple discharge, no skin change and no tenderness.  Left mastectomy is clean and well healed.   Lymphadenopathy:    She has no cervical adenopathy.    She has no axillary adenopathy.  Neurological: She is alert and oriented to person, place, and time.  Skin: Skin is warm and dry.    Data Reviewed Right mammogram dated February 08, 2014 was reviewed. Fatty replaced breast. BI-RAD-1.  Assessment    Doing well status post left mastectomy for stage I carcinoma. Good tolerance of Femara. Mild lower extremity edema.     Plan    Patient to have a bone density test at [facility] on 04-14-14 at 10 am.  She'll benefit from a trial of compressive stockings, a prescription was provided.  This patient will be asked to return in one year with a unilateral right breast screening mammogram and office visit follow up.     PCP: Etheleen Mayhew 02/22/2014, 6:02 AM

## 2014-02-22 ENCOUNTER — Encounter: Payer: Self-pay | Admitting: General Surgery

## 2014-02-22 DIAGNOSIS — R600 Localized edema: Secondary | ICD-10-CM | POA: Insufficient documentation

## 2014-03-07 DIAGNOSIS — I129 Hypertensive chronic kidney disease with stage 1 through stage 4 chronic kidney disease, or unspecified chronic kidney disease: Secondary | ICD-10-CM | POA: Diagnosis not present

## 2014-03-07 DIAGNOSIS — Z1389 Encounter for screening for other disorder: Secondary | ICD-10-CM | POA: Diagnosis not present

## 2014-03-07 DIAGNOSIS — Z23 Encounter for immunization: Secondary | ICD-10-CM | POA: Diagnosis not present

## 2014-03-07 DIAGNOSIS — E780 Pure hypercholesterolemia: Secondary | ICD-10-CM | POA: Diagnosis not present

## 2014-03-07 DIAGNOSIS — E1121 Type 2 diabetes mellitus with diabetic nephropathy: Secondary | ICD-10-CM | POA: Diagnosis not present

## 2014-03-07 DIAGNOSIS — N183 Chronic kidney disease, stage 3 (moderate): Secondary | ICD-10-CM | POA: Diagnosis not present

## 2014-03-15 DIAGNOSIS — N183 Chronic kidney disease, stage 3 (moderate): Secondary | ICD-10-CM | POA: Diagnosis not present

## 2014-03-15 DIAGNOSIS — D649 Anemia, unspecified: Secondary | ICD-10-CM | POA: Diagnosis not present

## 2014-03-21 DIAGNOSIS — D649 Anemia, unspecified: Secondary | ICD-10-CM | POA: Diagnosis not present

## 2014-03-24 ENCOUNTER — Encounter: Payer: Self-pay | Admitting: General Surgery

## 2014-03-24 ENCOUNTER — Ambulatory Visit (INDEPENDENT_AMBULATORY_CARE_PROVIDER_SITE_OTHER): Payer: Medicare Other | Admitting: General Surgery

## 2014-03-24 VITALS — BP 130/66 | HR 88 | Resp 14 | Ht 67.5 in | Wt 218.0 lb

## 2014-03-24 DIAGNOSIS — D509 Iron deficiency anemia, unspecified: Secondary | ICD-10-CM | POA: Diagnosis not present

## 2014-03-24 MED ORDER — POLYETHYLENE GLYCOL 3350 17 GM/SCOOP PO POWD: ORAL | Status: DC

## 2014-03-24 NOTE — Progress Notes (Signed)
Patient ID: Robin Ashley, female   DOB: 01/05/1932, 80 y.o.   MRN: [id number]  Chief Complaint  Patient presents with  . Anemia    hem + stool    HPI Robin Ashley is a 80 y.o. female.  Here today for evaluation of anemia and guaiac positive stools. She denies any new problems with the bowels. She had labs drawn by Dr. Sharyon Medicus office which prompted the hemoccult cards to be done. She has been prescribed iron to start but has not picked up her prescription yet. She has had a colonoscopy done in the past be is unsure how long ago.     HPI  Past Medical History  Diagnosis Date  . Diabetes mellitus without complication   . Hypertension   . Hyperlipidemia   . Foot drop, right October 2014    noted post mastectomy, conservative treatment was instituted with resolution, mild edema.  . 174.06 March 2013    Right breast, T1c, N0, 12 mm; ER PR positive, HER-2/neu not over expressed. Not a candidate for adjuvant chemotherapy per [facility] tumor board.    Past Surgical History  Procedure Laterality Date  . Intestinal balloon removed  1991  . Eye surgery Bilateral 04/2012  . Breast biopsy Left 02/25/13  . Breast surgery Left 03-15-13    left mastectomywith SN biopsy    Family History  Problem Relation Age of Onset  . Diabetes Brother     Social History History  Substance Use Topics  . Smoking status: Never Smoker   . Smokeless tobacco: Never Used  . Alcohol Use: No    No Known Allergies  Current Outpatient Prescriptions  Medication Sig Dispense Refill  . atorvastatin (LIPITOR) 80 MG tablet Take 80 mg by mouth daily.      Marland Kitchen etodolac (LODINE) 300 MG capsule Take 300 mg by mouth 3 (three) times daily.      . furosemide (LASIX) 20 MG tablet Take 20 mg by mouth daily.      Marland Kitchen letrozole (FEMARA) 2.5 MG tablet Take 1 tablet (2.5 mg total) by mouth daily.  90 tablet  4  . lisinopril (PRINIVIL,ZESTRIL) 10 MG tablet Take 10 mg by mouth daily.      . metFORMIN (GLUCOPHAGE) 500 MG  tablet Take 500 mg by mouth 2 (two) times daily with a meal.      . Multiple Vitamins-Minerals (CENTRUM SILVER PO) Take 1 tablet by mouth daily.      Marland Kitchen amLODipine (NORVASC) 10 MG tablet Take 10 mg by mouth daily.      . polyethylene glycol powder (GLYCOLAX/MIRALAX) powder 255 grams one bottle for colonoscopy prep  255 g  0   No current facility-administered medications for this visit.    Review of Systems Review of Systems  Constitutional: Negative.   Respiratory: Negative.   Cardiovascular: Negative.   Gastrointestinal: Negative.     Blood pressure 130/66, pulse 88, resp. rate 14, height 5' 7.5" (1.715 m), weight 218 lb (98.884 kg).  Physical Exam Physical Exam  Constitutional: She is oriented to person, place, and time. She appears well-developed and well-nourished.  Cardiovascular: Normal rate, regular rhythm and normal heart sounds.   No murmur heard. Pulmonary/Chest: Effort normal and breath sounds normal.  Neurological: She is alert and oriented to person, place, and time.  Skin: Skin is warm and dry.    Data Reviewed PCP labs. Modest fall in MCV to low normal level.  HGB down 2-3 points. Low ferretin at 11.  Mild renal impairment w/ eGFR of 54.  Assessment    Gradual development of iron deficiency anemia.      Plan    Patient has been scheduled for an upper and lower endoscopy on 04-27-14 at [facility]. This patient will need to be on a clear liquid diet for 48 hours prior to procedure due to her long history of constipation. She will make use of Dulcolax 10 mg BID 2 days prior to procedure in addition to the Miralax prep the day before procedure. Patient instructed to hold Metformin and Lodine for 3 days prior.     PCP/Ref: Etheleen Mayhew 03/27/2014, 7:59 AM

## 2014-03-24 NOTE — Patient Instructions (Addendum)
Patient to be scheduled of a colonoscopy and Upper Endoscopy.The patient is aware to call back for any questions or concerns.   Colonoscopy A colonoscopy is an exam to look at the entire large intestine (colon). This exam can help find problems such as tumors, polyps, inflammation, and areas of bleeding. The exam takes about 1 hour.  LET [facility] CARE PROVIDER KNOW ABOUT:   Any allergies you have.  All medicines you are taking, including vitamins, herbs, eye drops, creams, and over-the-counter medicines.  Previous problems you or members of your family have had with the use of anesthetics.  Any blood disorders you have.  Previous surgeries you have had.  Medical conditions you have. RISKS AND COMPLICATIONS  Generally, this is a safe procedure. However, as with any procedure, complications can occur. Possible complications include:  Bleeding.  Tearing or rupture of the colon wall.  Reaction to medicines given during the exam.  Infection (rare). BEFORE THE PROCEDURE   Ask your health care provider about changing or stopping your regular medicines.  You may be prescribed an oral bowel prep. This involves drinking a large amount of medicated liquid, starting the day before your procedure. The liquid will cause you to have multiple loose stools until your stool is almost clear or light green. This cleans out your colon in preparation for the procedure.  Do not eat or drink anything else once you have started the bowel prep, unless your health care provider tells you it is safe to do so.  Arrange for someone to drive you home after the procedure. PROCEDURE   You will be given medicine to help you relax (sedative).  You will lie on your side with your knees bent.  A long, flexible tube with a light and camera on the end (colonoscope) will be inserted through the rectum and into the colon. The camera sends video back to a computer screen as it moves through the colon. The  colonoscope also releases carbon dioxide gas to inflate the colon. This helps your health care provider see the area better.  During the exam, your health care provider may take a small tissue sample (biopsy) to be examined under a microscope if any abnormalities are found.  The exam is finished when the entire colon has been viewed. AFTER THE PROCEDURE   Do not drive for 24 hours after the exam.  You may have a small amount of blood in your stool.  You may pass moderate amounts of gas and have mild abdominal cramping or bloating. This is caused by the gas used to inflate your colon during the exam.  Ask when your test results will be ready and how you will get your results. Make sure you get your test results. Document Released: 05/17/2000 Document Revised: 03/10/2013 Document Reviewed: 01/25/2013 [facility] Patient Information 2015 [place], [place]. This information is not intended to replace advice given to you by your health care provider. Make sure you discuss any questions you have with your health care provider.      Patient has been scheduled for an upper and lower endoscopy on 04-27-14 at [facility]. This patient will need to be on a clear liquid diet for 48 hours prior to procedure. She will make use of Dulcolax 10 mg BID 2 days prior to procedure in addition to the Miralax prep the day before procedure. Patient instructed to hold Metformin and Lodine for 3 days prior.

## 2014-03-27 DIAGNOSIS — D509 Iron deficiency anemia, unspecified: Secondary | ICD-10-CM | POA: Insufficient documentation

## 2014-04-04 ENCOUNTER — Encounter: Payer: Self-pay | Admitting: General Surgery

## 2014-04-14 ENCOUNTER — Ambulatory Visit: Payer: Self-pay | Admitting: General Surgery

## 2014-04-14 ENCOUNTER — Encounter: Payer: Self-pay | Admitting: General Surgery

## 2014-04-14 DIAGNOSIS — Z780 Asymptomatic menopausal state: Secondary | ICD-10-CM | POA: Diagnosis not present

## 2014-04-14 DIAGNOSIS — M810 Age-related osteoporosis without current pathological fracture: Secondary | ICD-10-CM | POA: Diagnosis not present

## 2014-04-14 DIAGNOSIS — Z79899 Other long term (current) drug therapy: Secondary | ICD-10-CM | POA: Diagnosis not present

## 2014-04-14 LAB — HM DEXA SCAN

## 2014-04-20 ENCOUNTER — Telehealth: Payer: Self-pay | Admitting: *Deleted

## 2014-04-20 NOTE — Telephone Encounter (Signed)
Patient contacted today to confirm no medication changes since last office visit and she confirms. This patient reports she has pre-registered by phone and has Miralax prescription.  We will proceed as scheduled with colonoscopy on 04-27-14 at [facility].  Patient instructed to call the office should she have further questions.

## 2014-04-21 ENCOUNTER — Other Ambulatory Visit: Payer: Self-pay | Admitting: General Surgery

## 2014-04-21 DIAGNOSIS — D509 Iron deficiency anemia, unspecified: Secondary | ICD-10-CM

## 2014-04-27 ENCOUNTER — Ambulatory Visit: Payer: Self-pay | Admitting: General Surgery

## 2014-04-27 DIAGNOSIS — R195 Other fecal abnormalities: Secondary | ICD-10-CM | POA: Diagnosis not present

## 2014-04-27 DIAGNOSIS — D509 Iron deficiency anemia, unspecified: Secondary | ICD-10-CM | POA: Diagnosis not present

## 2014-04-27 DIAGNOSIS — K259 Gastric ulcer, unspecified as acute or chronic, without hemorrhage or perforation: Secondary | ICD-10-CM | POA: Diagnosis not present

## 2014-04-27 DIAGNOSIS — E119 Type 2 diabetes mellitus without complications: Secondary | ICD-10-CM | POA: Diagnosis not present

## 2014-04-27 DIAGNOSIS — E785 Hyperlipidemia, unspecified: Secondary | ICD-10-CM | POA: Diagnosis not present

## 2014-04-27 DIAGNOSIS — D649 Anemia, unspecified: Secondary | ICD-10-CM | POA: Diagnosis not present

## 2014-04-27 DIAGNOSIS — K2950 Unspecified chronic gastritis without bleeding: Secondary | ICD-10-CM | POA: Diagnosis not present

## 2014-04-27 DIAGNOSIS — E669 Obesity, unspecified: Secondary | ICD-10-CM | POA: Diagnosis not present

## 2014-04-27 DIAGNOSIS — K2900 Acute gastritis without bleeding: Secondary | ICD-10-CM | POA: Diagnosis not present

## 2014-04-27 DIAGNOSIS — B9681 Helicobacter pylori [H. pylori] as the cause of diseases classified elsewhere: Secondary | ICD-10-CM | POA: Diagnosis not present

## 2014-04-27 DIAGNOSIS — Z79899 Other long term (current) drug therapy: Secondary | ICD-10-CM | POA: Diagnosis not present

## 2014-05-02 ENCOUNTER — Encounter: Payer: Self-pay | Admitting: General Surgery

## 2014-05-03 ENCOUNTER — Telehealth: Payer: Self-pay

## 2014-05-03 NOTE — Telephone Encounter (Signed)
-----   Message from Carson Myrtle, RN sent at 05/03/2014  2:02 PM EST -----   ----- Message -----    From: Robert Bellow, MD    Sent: 05/03/2014  12:32 PM      To: Carson Myrtle, RN, Verlene Mayer, CMA  Will arrange for SBFT to look for other sources of blood loss prior to h pylori treatment.   Office visit after Small bowel follow through study is complete will be aprropriate.    Cc: Margarita Rana, MD

## 2014-05-03 NOTE — Telephone Encounter (Signed)
Spoke with patient about having a Small Bowel Follow thru prior to any treatments. Patient is amendable to this. Patient is scheduled for a SBFT at [facility] on 05/05/14 at 10:30 am. She will arrive there by 10:00 am and have nothing to eat or drink after midnight the night before. She will follow up here on 05/17/14 at 1:00 pm with Dr Bary Castilla. Patient is aware of dates, times, and instructions.

## 2014-05-05 ENCOUNTER — Ambulatory Visit: Payer: Self-pay | Admitting: General Surgery

## 2014-05-05 DIAGNOSIS — K5710 Diverticulosis of small intestine without perforation or abscess without bleeding: Secondary | ICD-10-CM | POA: Diagnosis not present

## 2014-05-05 DIAGNOSIS — D500 Iron deficiency anemia secondary to blood loss (chronic): Secondary | ICD-10-CM | POA: Diagnosis not present

## 2014-05-05 HISTORY — PX: OTHER SURGICAL HISTORY: SHX169

## 2014-05-06 ENCOUNTER — Encounter: Payer: Self-pay | Admitting: General Surgery

## 2014-05-16 DIAGNOSIS — Z961 Presence of intraocular lens: Secondary | ICD-10-CM | POA: Diagnosis not present

## 2014-05-16 DIAGNOSIS — H52223 Regular astigmatism, bilateral: Secondary | ICD-10-CM | POA: Diagnosis not present

## 2014-05-16 DIAGNOSIS — I10 Essential (primary) hypertension: Secondary | ICD-10-CM | POA: Diagnosis not present

## 2014-05-16 DIAGNOSIS — E119 Type 2 diabetes mellitus without complications: Secondary | ICD-10-CM | POA: Diagnosis not present

## 2014-05-16 DIAGNOSIS — H5203 Hypermetropia, bilateral: Secondary | ICD-10-CM | POA: Diagnosis not present

## 2014-05-16 DIAGNOSIS — H524 Presbyopia: Secondary | ICD-10-CM | POA: Diagnosis not present

## 2014-05-17 ENCOUNTER — Ambulatory Visit (INDEPENDENT_AMBULATORY_CARE_PROVIDER_SITE_OTHER): Payer: Medicare Other | Admitting: General Surgery

## 2014-05-17 ENCOUNTER — Encounter: Payer: Self-pay | Admitting: General Surgery

## 2014-05-17 VITALS — BP 130/70 | HR 70 | Resp 14 | Ht 67.0 in | Wt 224.0 lb

## 2014-05-17 DIAGNOSIS — D509 Iron deficiency anemia, unspecified: Secondary | ICD-10-CM | POA: Diagnosis not present

## 2014-05-17 DIAGNOSIS — B9681 Helicobacter pylori [H. pylori] as the cause of diseases classified elsewhere: Secondary | ICD-10-CM | POA: Diagnosis not present

## 2014-05-17 DIAGNOSIS — A048 Other specified bacterial intestinal infections: Secondary | ICD-10-CM

## 2014-05-17 MED ORDER — CLARITHROMYCIN 500 MG PO TABS: 500.0000 mg | ORAL_TABLET | Freq: Two times a day (BID) | ORAL | Status: AC

## 2014-05-17 MED ORDER — AMOXICILLIN 500 MG PO TABS: 500.0000 mg | ORAL_TABLET | Freq: Two times a day (BID) | ORAL | Status: DC

## 2014-05-17 MED ORDER — OMEPRAZOLE 20 MG PO CPDR: 20.0000 mg | DELAYED_RELEASE_CAPSULE | Freq: Two times a day (BID) | ORAL | Status: DC

## 2014-05-17 NOTE — Progress Notes (Signed)
Patient ID: Robin Ashley, female   DOB: 12-16-1931, 80 y.o.   MRN: [id number]  Chief Complaint  Patient presents with  . Anemia    HPI Robin Ashley is a 80 y.o. female here today for her post op small bowel follow thru done on 05/05/14. Patient states she is doing well. HPI  Past Medical History  Diagnosis Date  . Diabetes mellitus without complication   . Hypertension   . Hyperlipidemia   . Foot drop, right October 2014    noted post mastectomy, conservative treatment was instituted with resolution, mild edema.  . 174.06 March 2013    Right breast, T1c, N0, 12 mm; ER PR positive, HER-2/neu not over expressed. Not a candidate for adjuvant chemotherapy per [facility] tumor board.    Past Surgical History  Procedure Laterality Date  . Intestinal balloon removed  1991    Likely surgery for diverticulitis.  . Eye surgery Bilateral 04/2012  . Breast biopsy Left 02/25/13  . Breast surgery Left 03-15-13    left mastectomywith SN biopsy  . Small bowel follow thru  05/05/14    Jejunal diverticuli noted on small bowel follow-through.  . Colonoscopy    . Colon surgery  1991    Likely segmental resection for diverticulitis based on patient description.    Family History  Problem Relation Age of Onset  . Diabetes Brother     Social History History  Substance Use Topics  . Smoking status: Never Smoker   . Smokeless tobacco: Never Used  . Alcohol Use: No    No Known Allergies  Current Outpatient Prescriptions  Medication Sig Dispense Refill  . amLODipine (NORVASC) 10 MG tablet Take 10 mg by mouth daily.    Marland Kitchen atorvastatin (LIPITOR) 80 MG tablet Take 80 mg by mouth daily.    . ferrous sulfate 325 (65 FE) MG tablet   5  . furosemide (LASIX) 20 MG tablet Take 20 mg by mouth daily.    Marland Kitchen letrozole (FEMARA) 2.5 MG tablet Take 1 tablet (2.5 mg total) by mouth daily. 90 tablet 4  . lisinopril (PRINIVIL,ZESTRIL) 10 MG tablet Take 10 mg by mouth daily.    . meloxicam (MOBIC) 7.5 MG  tablet Take 7.5 mg by mouth daily.    . metFORMIN (GLUCOPHAGE) 500 MG tablet Take 500 mg by mouth 2 (two) times daily with a meal.    . Multiple Vitamins-Minerals (CENTRUM SILVER PO) Take 1 tablet by mouth daily.    . polyethylene glycol powder (GLYCOLAX/MIRALAX) powder 255 grams one bottle for colonoscopy prep 255 g 0  . amoxicillin (AMOXIL) 500 MG tablet Take 1 tablet (500 mg total) by mouth 2 (two) times daily. 60 tablet 0  . clarithromycin (BIAXIN) 500 MG tablet Take 1 tablet (500 mg total) by mouth 2 (two) times daily. 30 tablet 0  . etodolac (LODINE) 300 MG capsule Take 300 mg by mouth 3 (three) times daily.    Marland Kitchen omeprazole (PRILOSEC) 20 MG capsule Take 1 capsule (20 mg total) by mouth 2 (two) times daily before a meal. 60 capsule 0   No current facility-administered medications for this visit.    Review of Systems Review of Systems  Constitutional: Negative.   Respiratory: Negative.   Cardiovascular: Negative.     Blood pressure 130/70, pulse 70, resp. rate 14, height 5' 7"  (1.702 m), weight 224 lb (101.606 kg).  Physical Exam Physical Exam  Data Reviewed Upper endoscopy showed a small gastric ulcer biopsy which was positive for H.  pylori.  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 No evidence of obstruction.  Previous upper and lower endoscopy showed no bleeding site.  Assessment    Iron deficiency, small gastric ulcer.    Plan    The ulcer was not felt to represent the source of blood loss. As she was taking 2 anti-inflammatories, Lodine 3 times a day and Mobic daily, this is likely put her at risk for further bleeding. She'll discontinue the Lodine but continue the Mobic daily. If she finds that the arthritis symptoms are severe, this dose might be increased from 7.5-15 mg per day.  We will initiate therapy for her H. pylori. We'll have her make use of Prilosec 20 mg twice a day, Biaxin 500 mg 3 times a day for 2 weeks and amoxicillin 1 g twice  a day for 2 weeks. We'll reassess her in 1 month and likely obtain a repeat CBC at that time. The patient was encouraged to make use of daily yogurt to minimize GI effects from the antibiotic regimen.    PCP:  Albin Fischer 05/18/2014, 6:01 AM

## 2014-05-17 NOTE — Patient Instructions (Addendum)
Patient to stop the Lodine and take the Mobic. Patient to return in one month.

## 2014-05-18 ENCOUNTER — Encounter: Payer: Self-pay | Admitting: General Surgery

## 2014-05-18 DIAGNOSIS — A048 Other specified bacterial intestinal infections: Secondary | ICD-10-CM | POA: Insufficient documentation

## 2014-05-31 ENCOUNTER — Encounter: Payer: Self-pay | Admitting: General Surgery

## 2014-06-10 ENCOUNTER — Other Ambulatory Visit: Payer: Self-pay | Admitting: General Surgery

## 2014-06-10 DIAGNOSIS — J069 Acute upper respiratory infection, unspecified: Secondary | ICD-10-CM | POA: Diagnosis not present

## 2014-06-10 DIAGNOSIS — E1121 Type 2 diabetes mellitus with diabetic nephropathy: Secondary | ICD-10-CM | POA: Diagnosis not present

## 2014-06-10 DIAGNOSIS — N183 Chronic kidney disease, stage 3 (moderate): Secondary | ICD-10-CM | POA: Diagnosis not present

## 2014-06-10 DIAGNOSIS — Z23 Encounter for immunization: Secondary | ICD-10-CM | POA: Diagnosis not present

## 2014-06-10 DIAGNOSIS — Z1389 Encounter for screening for other disorder: Secondary | ICD-10-CM | POA: Diagnosis not present

## 2014-06-10 DIAGNOSIS — I129 Hypertensive chronic kidney disease with stage 1 through stage 4 chronic kidney disease, or unspecified chronic kidney disease: Secondary | ICD-10-CM | POA: Diagnosis not present

## 2014-06-16 ENCOUNTER — Encounter: Payer: Self-pay | Admitting: General Surgery

## 2014-06-16 ENCOUNTER — Ambulatory Visit (INDEPENDENT_AMBULATORY_CARE_PROVIDER_SITE_OTHER): Payer: Medicare Other | Admitting: General Surgery

## 2014-06-16 VITALS — BP 144/74 | HR 80 | Resp 16 | Ht 67.0 in | Wt 223.0 lb

## 2014-06-16 DIAGNOSIS — D509 Iron deficiency anemia, unspecified: Secondary | ICD-10-CM

## 2014-06-16 NOTE — Progress Notes (Signed)
Patient ID: Robin Ashley, female   DOB: 1931-10-20, 81 y.o.   MRN: [id number]  Chief Complaint  Patient presents with  . Follow-up  . Anemia    HPI Robin Ashley is a 81 y.o. female.  today for her follow up anemia and she had a small bowel follow thru done on 05/05/14. Patient states she is doing well. No blood in stools. Bowels generally move daily and she uses miralax about once a week. She completed her treatment for H Pylori without ill effect.   HPI  Past Medical History  Diagnosis Date  . Diabetes mellitus without complication   . Hypertension   . Hyperlipidemia   . Foot drop, right October 2014    noted post mastectomy, conservative treatment was instituted with resolution, mild edema.  . 174.06 March 2013    Right breast, T1c, N0, 12 mm; ER PR positive, HER-2/neu not over expressed. Not a candidate for adjuvant chemotherapy per [facility] tumor board.    Past Surgical History  Procedure Laterality Date  . Intestinal balloon removed  1991    Likely surgery for diverticulitis.  . Eye surgery Bilateral 04/2012  . Breast biopsy Left 02/25/13  . Breast surgery Left 03-15-13    left mastectomywith SN biopsy  . Small bowel follow thru  05/05/14    Jejunal diverticuli noted on small bowel follow-through.  . Colonoscopy    . Colon surgery  1991    Likely segmental resection for diverticulitis based on patient description.    Family History  Problem Relation Age of Onset  . Diabetes Brother     Social History History  Substance Use Topics  . Smoking status: Never Smoker   . Smokeless tobacco: Never Used  . Alcohol Use: No    No Known Allergies  Current Outpatient Prescriptions  Medication Sig Dispense Refill  . amLODipine (NORVASC) 10 MG tablet Take 10 mg by mouth daily.    Marland Kitchen amoxicillin (AMOXIL) 500 MG tablet Take 1 tablet (500 mg total) by mouth 2 (two) times daily. 60 tablet 0  . atorvastatin (LIPITOR) 80 MG tablet Take 80 mg by mouth daily.    . ferrous  sulfate 325 (65 FE) MG tablet   5  . furosemide (LASIX) 20 MG tablet Take 20 mg by mouth daily.    Marland Kitchen letrozole (FEMARA) 2.5 MG tablet Take 1 tablet (2.5 mg total) by mouth daily. 90 tablet 4  . lisinopril (PRINIVIL,ZESTRIL) 10 MG tablet Take 10 mg by mouth daily.    . meloxicam (MOBIC) 7.5 MG tablet Take 7.5 mg by mouth daily.    . metFORMIN (GLUCOPHAGE) 500 MG tablet Take 500 mg by mouth 2 (two) times daily with a meal.    . Multiple Vitamins-Minerals (CENTRUM SILVER PO) Take 1 tablet by mouth daily.    Marland Kitchen omeprazole (PRILOSEC) 20 MG capsule TAKE 1 CAPSULE (20 MG TOTAL) BY MOUTH 2 (TWO) TIMES DAILY BEFORE A MEAL. 60 capsule 0  . traZODone (DESYREL) 50 MG tablet Take 50 mg by mouth at bedtime.    . polyethylene glycol powder (GLYCOLAX/MIRALAX) powder   0   No current facility-administered medications for this visit.    Review of Systems Review of Systems  Constitutional: Negative.   Respiratory: Positive for cough.   Cardiovascular: Negative.   Gastrointestinal: Negative for abdominal pain and blood in stool.  Neurological: Negative for light-headedness.    Blood pressure 144/74, pulse 80, resp. rate 16, height 5' 7" (1.702 m), weight 223 lb (101.152  kg).  Physical Exam Physical Exam  Constitutional: She is oriented to person, place, and time. She appears well-developed and well-nourished.  Neck: Neck supple.  Cardiovascular: Normal rate and regular rhythm.   Murmur heard. Pulmonary/Chest: Effort normal and breath sounds normal.  Lymphadenopathy:    She has no cervical adenopathy.  Neurological: She is alert and oriented to person, place, and time.  Skin: Skin is warm and dry.    Data Reviewed CBC obtained today shows an improvement in her hemoglobin from 9.5 with an MCV of 87 to 10.5 with an MCV of 88.  Assessment    Improvement in modest anemia status post treatment for H. Pylori.     Plan    The patient will continue her present iron supplements. We'll arrange for  repeat CBC in 2 months.      PCP:  Etheleen Mayhew 06/17/2014, 6:09 PM

## 2014-06-16 NOTE — Patient Instructions (Signed)
The patient is aware to call back for any questions or concerns.  

## 2014-06-17 LAB — CBC WITH DIFFERENTIAL
BASOS ABS: 0.1 10*3/uL (ref 0.0–0.2)
BASOS: 0 %
Eos: 3 %
Eosinophils Absolute: 0.4 10*3/uL (ref 0.0–0.4)
HCT: 32.1 % — ABNORMAL LOW (ref 34.0–46.6)
Hemoglobin: 10.5 g/dL — ABNORMAL LOW (ref 11.1–15.9)
IMMATURE GRANULOCYTES: 0 %
Immature Grans (Abs): 0 10*3/uL (ref 0.0–0.1)
LYMPHS ABS: 2.7 10*3/uL (ref 0.7–3.1)
Lymphs: 23 %
MCH: 28.8 pg (ref 26.6–33.0)
MCHC: 32.7 g/dL (ref 31.5–35.7)
MCV: 88 fL (ref 79–97)
Monocytes Absolute: 1.1 10*3/uL — ABNORMAL HIGH (ref 0.1–0.9)
Monocytes: 10 %
Neutrophils Absolute: 7.1 10*3/uL — ABNORMAL HIGH (ref 1.4–7.0)
Neutrophils Relative %: 64 %
Platelets: 374 10*3/uL (ref 150–379)
RBC: 3.64 x10E6/uL — ABNORMAL LOW (ref 3.77–5.28)
RDW: 15.7 % — ABNORMAL HIGH (ref 12.3–15.4)
WBC: 11.3 10*3/uL — ABNORMAL HIGH (ref 3.4–10.8)

## 2014-06-20 ENCOUNTER — Telehealth: Payer: Self-pay

## 2014-06-20 NOTE — Telephone Encounter (Signed)
-----   Message from Robert Bellow, MD sent at 06/17/2014  6:11 PM EST ----- Please notify the patient at her most recent blood count was improved by 1. Which is excellent. Please arrange for a CBC in  2 months.Thank you

## 2014-06-20 NOTE — Telephone Encounter (Signed)
Notified patient as instructed, patient pleased. Discussed repeat CBC in 2 months, patient agrees. Patient placed in recalls for repeat CBC in 2 months.

## 2014-07-11 ENCOUNTER — Other Ambulatory Visit: Payer: Self-pay | Admitting: General Surgery

## 2014-08-03 ENCOUNTER — Other Ambulatory Visit: Payer: Self-pay

## 2014-08-03 DIAGNOSIS — D509 Iron deficiency anemia, unspecified: Secondary | ICD-10-CM

## 2014-08-10 ENCOUNTER — Other Ambulatory Visit: Payer: Self-pay | Admitting: General Surgery

## 2014-08-11 DIAGNOSIS — D509 Iron deficiency anemia, unspecified: Secondary | ICD-10-CM | POA: Diagnosis not present

## 2014-08-12 LAB — CBC WITH DIFFERENTIAL/PLATELET
BASOS ABS: 0.1 10*3/uL (ref 0.0–0.2)
Basos: 1 %
EOS ABS: 0.4 10*3/uL (ref 0.0–0.4)
Eos: 4 %
HEMATOCRIT: 32.3 % — AB (ref 34.0–46.6)
HEMOGLOBIN: 10.4 g/dL — AB (ref 11.1–15.9)
IMMATURE GRANS (ABS): 0 10*3/uL (ref 0.0–0.1)
IMMATURE GRANULOCYTES: 0 %
LYMPHS: 30 %
Lymphocytes Absolute: 2.7 10*3/uL (ref 0.7–3.1)
MCH: 28.4 pg (ref 26.6–33.0)
MCHC: 32.2 g/dL (ref 31.5–35.7)
MCV: 88 fL (ref 79–97)
Monocytes Absolute: 0.9 10*3/uL (ref 0.1–0.9)
Monocytes: 10 %
NEUTROS PCT: 55 %
Neutrophils Absolute: 4.8 10*3/uL (ref 1.4–7.0)
Platelets: 311 10*3/uL (ref 150–379)
RBC: 3.66 x10E6/uL — ABNORMAL LOW (ref 3.77–5.28)
RDW: 15 % (ref 12.3–15.4)
WBC: 8.9 10*3/uL (ref 3.4–10.8)

## 2014-08-17 DIAGNOSIS — G4733 Obstructive sleep apnea (adult) (pediatric): Secondary | ICD-10-CM | POA: Diagnosis not present

## 2014-08-17 DIAGNOSIS — E782 Mixed hyperlipidemia: Secondary | ICD-10-CM | POA: Diagnosis not present

## 2014-08-17 DIAGNOSIS — E119 Type 2 diabetes mellitus without complications: Secondary | ICD-10-CM | POA: Diagnosis not present

## 2014-08-17 DIAGNOSIS — I10 Essential (primary) hypertension: Secondary | ICD-10-CM | POA: Diagnosis not present

## 2014-09-06 ENCOUNTER — Other Ambulatory Visit: Payer: Self-pay | Admitting: General Surgery

## 2014-09-23 NOTE — Op Note (Signed)
PATIENT NAME:  Robin Ashley, Robin Ashley MR#:  [id number] DATE OF BIRTH:  November 13, 1931  DATE OF PROCEDURE:  03/15/2013  PREOPERATIVE DIAGNOSIS: Invasive mammary carcinoma of the left breast.   POSTOPERATIVE DIAGNOSIS: Invasive mammary carcinoma of the left breast.   OPERATIVE PROCEDURE: Left mastectomy with sentinel node biopsy.   SURGEON: Hervey Ard, MD   ANESTHESIA: General by LMA under Dr. Andree Elk.   ESTIMATED BLOOD LOSS: 100 mL.   CLINICAL NOTE: This is a 81 year old woman who had an abnormal mammogram and core biopsy showed evidence of invasive ductal carcinoma. The patient decided against breast conservation. She was injected with technetium sulfur colloid prior to the procedure.   OPERATIVE NOTE: At the induction of general anesthesia, 6 mL of methylene blue diluted 1:2 with saline was injected in the subareolar plexus. The breast and chest and axilla were then prepped with ChloraPrep and draped. An elliptical incision was outlined to proceed with a simple mastectomy. The skin was incised sharply and the remaining dissection completed with electrocautery. Hemostasis was with electrocautery and 3-0 Vicryl ties. The patient had a plexus of blood vessels in the subcutaneous tissue, in the upper inner quadrant of the breast, accounting for the majority of the blood loss. The breast flaps were elevated to the clavicle superiorly, sternum medially, rectus fascia inferiorly, and the serratus muscle laterally. The breast was then elevated off the underlying pectoralis muscle taking the fascia of that muscle with the specimen. A single hot blue node was identified in the axilla and frozen section was negative for macro metastatic disease. There was a cluster of 1 cm, plus/minus, firm rounded nodes below this and these were sent for exam and these were also negative. The area was irrigated with water. After assuring good hemostasis, a single Blake drain was brought out through the inferior medial flap and  anchored in place with nylon suture. The skin was approximated with a running 2-0 Vicryl deep dermal layer. Benzoin and Steri-Strips followed by a Telfa pad, fluff gauze and Kerlix were applied. Ace wrap was then placed.   In the recovery area, the patient reported numbness in the foot. Examination was consistent with peroneal nerve dysfunction. The patient had had her legs supported on a pillow, gel pads on the feet and strap across the thigh. The etiology for this is unclear, but may be related to previous brace usage.  The patient tolerated the procedure otherwise quite well and was subsequently discharged home.  ____________________________ Robert Bellow, MD jwb:sb D: 03/16/2013 07:27:50 ET T: 03/16/2013 07:38:24 ET JOB#: [id number]  cc: Robert Bellow, MD, <Dictator> Jerrell Belfast, MD JEFFREY Amedeo Kinsman MD ELECTRONICALLY SIGNED 03/17/2013 8:41

## 2014-09-26 LAB — SURGICAL PATHOLOGY

## 2014-10-12 DIAGNOSIS — Z1389 Encounter for screening for other disorder: Secondary | ICD-10-CM | POA: Diagnosis not present

## 2014-10-12 DIAGNOSIS — E1121 Type 2 diabetes mellitus with diabetic nephropathy: Secondary | ICD-10-CM | POA: Diagnosis not present

## 2014-10-12 DIAGNOSIS — Z0000 Encounter for general adult medical examination without abnormal findings: Secondary | ICD-10-CM | POA: Diagnosis not present

## 2014-10-12 DIAGNOSIS — D649 Anemia, unspecified: Secondary | ICD-10-CM | POA: Diagnosis not present

## 2014-10-12 DIAGNOSIS — I129 Hypertensive chronic kidney disease with stage 1 through stage 4 chronic kidney disease, or unspecified chronic kidney disease: Secondary | ICD-10-CM | POA: Diagnosis not present

## 2014-10-12 DIAGNOSIS — Z23 Encounter for immunization: Secondary | ICD-10-CM | POA: Diagnosis not present

## 2014-10-13 DIAGNOSIS — E1121 Type 2 diabetes mellitus with diabetic nephropathy: Secondary | ICD-10-CM | POA: Diagnosis not present

## 2014-10-13 DIAGNOSIS — I129 Hypertensive chronic kidney disease with stage 1 through stage 4 chronic kidney disease, or unspecified chronic kidney disease: Secondary | ICD-10-CM | POA: Diagnosis not present

## 2014-10-13 DIAGNOSIS — E780 Pure hypercholesterolemia: Secondary | ICD-10-CM | POA: Diagnosis not present

## 2014-10-13 DIAGNOSIS — D649 Anemia, unspecified: Secondary | ICD-10-CM | POA: Diagnosis not present

## 2014-10-13 LAB — LIPID PANEL
CHOLESTEROL: 170 mg/dL (ref 0–200)
HDL: 45 mg/dL (ref 35–70)
LDL Cholesterol: 77 mg/dL
Triglycerides: 238 mg/dL — AB (ref 40–160)

## 2014-10-13 LAB — CBC AND DIFFERENTIAL
HEMATOCRIT: 34 % — AB (ref 36–46)
HEMOGLOBIN: 11.2 g/dL — AB (ref 12.0–16.0)
PLATELETS: 315 10*3/uL (ref 150–399)
WBC: 9.5 10*3/mL

## 2014-10-13 LAB — BASIC METABOLIC PANEL
BUN: 14 mg/dL (ref 4–21)
Creatinine: 1.2 mg/dL — AB (ref 0.5–1.1)
Glucose: 112 mg/dL
POTASSIUM: 4.7 mmol/L (ref 3.4–5.3)
SODIUM: 141 mmol/L (ref 137–147)

## 2014-10-13 LAB — HEPATIC FUNCTION PANEL
ALT: 12 U/L (ref 7–35)
AST: 15 U/L (ref 13–35)

## 2014-10-13 LAB — HEMOGLOBIN A1C: Hgb A1c MFr Bld: 6.5 % — AB (ref 4.0–6.0)

## 2014-10-13 LAB — TSH: TSH: 1.9 u[IU]/mL (ref 0.41–5.90)

## 2014-11-22 ENCOUNTER — Other Ambulatory Visit: Payer: Self-pay

## 2014-11-22 DIAGNOSIS — M1990 Unspecified osteoarthritis, unspecified site: Secondary | ICD-10-CM | POA: Insufficient documentation

## 2014-11-22 MED ORDER — MELOXICAM 15 MG PO TABS: 15.0000 mg | ORAL_TABLET | Freq: Every day | ORAL | Status: DC

## 2014-12-28 ENCOUNTER — Other Ambulatory Visit: Payer: Self-pay

## 2014-12-28 DIAGNOSIS — Z1231 Encounter for screening mammogram for malignant neoplasm of breast: Secondary | ICD-10-CM

## 2015-02-10 ENCOUNTER — Other Ambulatory Visit: Payer: Self-pay | Admitting: General Surgery

## 2015-02-10 ENCOUNTER — Ambulatory Visit
Admission: RE | Admit: 2015-02-10 | Discharge: 2015-02-10 | Disposition: A | Discharge status: Home / Self Care | Payer: Medicare Other | Source: Ambulatory Visit | Attending: General Surgery | Admitting: General Surgery

## 2015-02-10 DIAGNOSIS — Z1231 Encounter for screening mammogram for malignant neoplasm of breast: Secondary | ICD-10-CM | POA: Diagnosis not present

## 2015-02-15 ENCOUNTER — Ambulatory Visit: Payer: Self-pay | Admitting: Family Medicine

## 2015-02-15 DIAGNOSIS — G4733 Obstructive sleep apnea (adult) (pediatric): Secondary | ICD-10-CM | POA: Diagnosis not present

## 2015-02-15 DIAGNOSIS — E119 Type 2 diabetes mellitus without complications: Secondary | ICD-10-CM | POA: Diagnosis not present

## 2015-02-15 DIAGNOSIS — E782 Mixed hyperlipidemia: Secondary | ICD-10-CM | POA: Diagnosis not present

## 2015-02-15 DIAGNOSIS — I10 Essential (primary) hypertension: Secondary | ICD-10-CM | POA: Diagnosis not present

## 2015-02-16 DIAGNOSIS — I129 Hypertensive chronic kidney disease with stage 1 through stage 4 chronic kidney disease, or unspecified chronic kidney disease: Secondary | ICD-10-CM | POA: Insufficient documentation

## 2015-02-16 DIAGNOSIS — S92909A Unspecified fracture of unspecified foot, initial encounter for closed fracture: Secondary | ICD-10-CM | POA: Insufficient documentation

## 2015-02-16 DIAGNOSIS — K5900 Constipation, unspecified: Secondary | ICD-10-CM | POA: Insufficient documentation

## 2015-02-16 DIAGNOSIS — R609 Edema, unspecified: Secondary | ICD-10-CM | POA: Insufficient documentation

## 2015-02-16 DIAGNOSIS — M25519 Pain in unspecified shoulder: Secondary | ICD-10-CM | POA: Insufficient documentation

## 2015-02-16 DIAGNOSIS — Z8711 Personal history of peptic ulcer disease: Secondary | ICD-10-CM | POA: Insufficient documentation

## 2015-02-16 DIAGNOSIS — E119 Type 2 diabetes mellitus without complications: Secondary | ICD-10-CM | POA: Insufficient documentation

## 2015-02-16 DIAGNOSIS — IMO0002 Reserved for concepts with insufficient information to code with codable children: Secondary | ICD-10-CM | POA: Insufficient documentation

## 2015-02-16 DIAGNOSIS — Z8719 Personal history of other diseases of the digestive system: Secondary | ICD-10-CM | POA: Insufficient documentation

## 2015-02-16 DIAGNOSIS — E7800 Pure hypercholesterolemia, unspecified: Secondary | ICD-10-CM | POA: Insufficient documentation

## 2015-02-16 DIAGNOSIS — B351 Tinea unguium: Secondary | ICD-10-CM | POA: Insufficient documentation

## 2015-02-16 DIAGNOSIS — N183 Chronic kidney disease, stage 3 unspecified: Secondary | ICD-10-CM | POA: Insufficient documentation

## 2015-02-16 DIAGNOSIS — R252 Cramp and spasm: Secondary | ICD-10-CM | POA: Insufficient documentation

## 2015-02-16 DIAGNOSIS — Z9181 History of falling: Secondary | ICD-10-CM | POA: Insufficient documentation

## 2015-02-16 DIAGNOSIS — F4320 Adjustment disorder, unspecified: Secondary | ICD-10-CM | POA: Insufficient documentation

## 2015-02-16 DIAGNOSIS — M25569 Pain in unspecified knee: Secondary | ICD-10-CM | POA: Insufficient documentation

## 2015-02-16 DIAGNOSIS — G4700 Insomnia, unspecified: Secondary | ICD-10-CM | POA: Insufficient documentation

## 2015-02-16 DIAGNOSIS — D649 Anemia, unspecified: Secondary | ICD-10-CM | POA: Insufficient documentation

## 2015-02-16 DIAGNOSIS — K5792 Diverticulitis of intestine, part unspecified, without perforation or abscess without bleeding: Secondary | ICD-10-CM | POA: Insufficient documentation

## 2015-02-17 ENCOUNTER — Ambulatory Visit (INDEPENDENT_AMBULATORY_CARE_PROVIDER_SITE_OTHER): Payer: Medicare Other | Admitting: Family Medicine

## 2015-02-17 ENCOUNTER — Encounter: Payer: Self-pay | Admitting: Family Medicine

## 2015-02-17 VITALS — BP 136/62 | HR 76 | Temp 97.6°F | Resp 16 | Ht 64.0 in | Wt 228.0 lb

## 2015-02-17 DIAGNOSIS — N189 Chronic kidney disease, unspecified: Secondary | ICD-10-CM | POA: Diagnosis not present

## 2015-02-17 DIAGNOSIS — E7800 Pure hypercholesterolemia, unspecified: Secondary | ICD-10-CM

## 2015-02-17 DIAGNOSIS — D509 Iron deficiency anemia, unspecified: Secondary | ICD-10-CM

## 2015-02-17 DIAGNOSIS — E1122 Type 2 diabetes mellitus with diabetic chronic kidney disease: Secondary | ICD-10-CM

## 2015-02-17 DIAGNOSIS — N183 Chronic kidney disease, stage 3 unspecified: Secondary | ICD-10-CM

## 2015-02-17 DIAGNOSIS — Z23 Encounter for immunization: Secondary | ICD-10-CM | POA: Diagnosis not present

## 2015-02-17 DIAGNOSIS — K5790 Diverticulosis of intestine, part unspecified, without perforation or abscess without bleeding: Secondary | ICD-10-CM | POA: Insufficient documentation

## 2015-02-17 LAB — POCT GLYCOSYLATED HEMOGLOBIN (HGB A1C)
Est. average glucose Bld gHb Est-mCnc: 131
Hemoglobin A1C: 6.2

## 2015-02-17 NOTE — Progress Notes (Signed)
Subjective:    Patient ID: Robin Ashley, female    DOB: 09-Oct-1931, 81 y.o.   MRN: [id number]  Diabetes She presents for her follow-up (Last A1C 10/13/2014, 6.5%) diabetic visit. She has type 2 diabetes mellitus. There are no hypoglycemic associated symptoms. Pertinent negatives for hypoglycemia include no headaches or sweats. Associated symptoms include fatigue (occasionally) and foot paresthesias. Pertinent negatives for diabetes include no blurred vision, no chest pain, no foot ulcerations, no polydipsia, no polyphagia, no polyuria, no visual change, no weakness and no weight loss. Diabetic complications include nephropathy. Current diabetic treatment includes oral agent (monotherapy) (Metformin 500 mg BID). She is compliant with treatment all of the time. (BS not being checked) An ACE inhibitor/angiotensin II receptor blocker is being taken. Eye exam is current (November 2015. Dr. Matilde Sprang).  Hypertension This is a chronic problem. Associated symptoms include malaise/fatigue and peripheral edema. Pertinent negatives include no anxiety, blurred vision, chest pain, headaches, neck pain, orthopnea, palpitations, shortness of breath or sweats. Treatments tried: Amlodipine 10 mg, Lisinopril 10 mg. There are no compliance problems.    Also being treated for carpal tunnel in her right hand and left arm.  Takes her Mobic as needed. Following up with Dr. Lisette Grinder.  Also has history of anemia. Following up with Dr. Bary Castilla next week. Labs have been stable. Work up inconclusive. Source of iron deficiency unclear.  Had labs for cholesterol, etc. Last ov in May. Stable.   Taking medication without any difficulty or side effects.     Review of Systems  Constitutional: Positive for malaise/fatigue and fatigue (occasionally). Negative for weight loss.  Eyes: Negative for blurred vision.  Respiratory: Negative for shortness of breath.   Cardiovascular: Negative for chest pain, palpitations and orthopnea.   Endocrine: Negative for polydipsia, polyphagia and polyuria.  Musculoskeletal: Positive for myalgias (esp. left arm). Negative for neck pain.  Neurological: Negative for weakness and headaches.   BP 136/62 mmHg  Pulse 76  Temp(Src) 97.6 F (36.4 C) (Oral)  Resp 16  Ht 5' 4"  (1.626 m)  Wt 228 lb (103.42 kg)  BMI 39.12 kg/m2   Patient Active Problem List   Diagnosis Date Noted  . Adaptation reaction 02/16/2015  . Absolute anemia 02/16/2015  . At risk for falling 02/16/2015  . Benign hypertension 02/16/2015  . Adult BMI 30+ 02/16/2015  . Chronic kidney disease (CKD), stage III (moderate) 02/16/2015  . CN (constipation) 02/16/2015  . Diabetes 02/16/2015  . Diverticulitis 02/16/2015  . Accumulation of fluid in tissues 02/16/2015  . Broken foot 02/16/2015  . H/O gastric ulcer 02/16/2015  . Hypercholesteremia 02/16/2015  . Cannot sleep 02/16/2015  . Gonalgia 02/16/2015  . Cramps of lower extremity 02/16/2015  . Fungal infection of toenail 02/16/2015  . Pain in shoulder 02/16/2015  . Arthritis 11/22/2014  . H. pylori infection 05/18/2014  . Helicobacter pylori gastrointestinal tract infection 05/18/2014  . Anemia, iron deficiency 03/27/2014  . Bilateral lower extremity edema 02/22/2014  . Seroma, postoperative 04/07/2013  . Breast cancer 03/04/2013   Past Medical History  Diagnosis Date  . Diabetes mellitus without complication   . Hypertension   . Hyperlipidemia   . Foot drop, right October 2014    noted post mastectomy, conservative treatment was instituted with resolution, mild edema.  . 174.06 March 2013    Right breast, T1c, N0, 12 mm; ER PR positive, HER-2/neu not over expressed. Not a candidate for adjuvant chemotherapy per [facility] tumor board.   Current Outpatient Prescriptions on File Prior to  Visit  Medication Sig  . amLODipine (NORVASC) 10 MG tablet Take 10 mg by mouth daily.  Marland Kitchen atorvastatin (LIPITOR) 80 MG tablet Take 80 mg by mouth daily.  . furosemide  (LASIX) 20 MG tablet Take 20 mg by mouth daily.  Marland Kitchen glucose blood test strip   . letrozole (FEMARA) 2.5 MG tablet Take 1 tablet (2.5 mg total) by mouth daily.  Marland Kitchen lisinopril (PRINIVIL,ZESTRIL) 10 MG tablet Take 10 mg by mouth daily.  . meloxicam (MOBIC) 15 MG tablet Take 1 tablet (15 mg total) by mouth daily.  . metFORMIN (GLUCOPHAGE) 500 MG tablet Take 500 mg by mouth 2 (two) times daily with a meal.  . Multiple Vitamins-Minerals (CENTRUM SILVER PO) Take 1 tablet by mouth daily.  Marland Kitchen omeprazole (PRILOSEC) 20 MG capsule Take 1 capsule (20 mg total) by mouth daily.  . traZODone (DESYREL) 50 MG tablet Take 50 mg by mouth at bedtime.  . ferrous sulfate 325 (65 FE) MG tablet   . polyethylene glycol powder (GLYCOLAX/MIRALAX) powder    No current facility-administered medications on file prior to visit.   No Known Allergies Past Surgical History  Procedure Laterality Date  . Intestinal balloon removed  1991    Likely surgery for diverticulitis.  . Eye surgery Bilateral 04/2012  . Breast surgery Left 03-15-13    left mastectomywith SN biopsy  . Small bowel follow thru  05/05/14    Jejunal diverticuli noted on small bowel follow-through.  . Colonoscopy    . Colon surgery  1991    Likely segmental resection for diverticulitis based on patient description.  . Breast biopsy Left 02/25/13    positive  . Mastectomy Left     positive   Social History   Social History  . Marital Status: Single    Spouse Name: N/A  . Number of Children: N/A  . Years of Education: N/A   Occupational History  . Not on file.   Social History Main Topics  . Smoking status: Never Smoker   . Smokeless tobacco: Never Used  . Alcohol Use: No  . Drug Use: No  . Sexual Activity: Not on file   Other Topics Concern  . Not on file   Social History Narrative   Family History  Problem Relation Age of Onset  . Cancer Brother     colon  . Diabetes Brother   . Breast cancer Neg Hx   . Heart disease Brother   .  Diabetes Brother         Objective:   Physical Exam  Constitutional: She is oriented to person, place, and time. She appears well-developed and well-nourished.  Cardiovascular: Normal rate and regular rhythm.   Pulmonary/Chest: Effort normal and breath sounds normal.  Musculoskeletal:  Brace on right wrist.   Neurological: She is alert and oriented to person, place, and time.  Psychiatric: She has a normal mood and affect. Her behavior is normal. Judgment and thought content normal.   Diabetic Foot Exam - Simple   Simple Foot Form  Diabetic Foot exam was performed with the following findings:  Yes 02/17/2015  8:56 AM  Visual Inspection  No deformities, no ulcerations, no other skin breakdown bilaterally:  Yes  Sensation Testing  Intact to touch and monofilament testing bilaterally:  Yes  Pulse Check  Posterior Tibialis and Dorsalis pulse intact bilaterally:  Yes  Comments           Assessment & Plan:  1. Type 2 diabetes mellitus with diabetic chronic  kidney disease Improved. Continue current medication and plan of care.  Recheck in 4 months.  - POCT glycosylated hemoglobin (Hb A1C) Results for orders placed or performed in visit on 02/17/15  POCT glycosylated hemoglobin (Hb A1C)  Result Value Ref Range   Hemoglobin A1C 6.2    Est. average glucose Bld gHb Est-mCnc 131     2. Flu vaccine need Given today.  - Flu vaccine HIGH DOSE PF  3. Diverticulosis of intestine without bleeding, unspecified intestinal tract location Stable. Feels well. No bleeding.   4. Hypercholesteremia Stable. Labs checked in May.   5. Chronic kidney disease (CKD), stage III (moderate) Stable.   6. Anemia, iron deficiency Low normal hemoglobin in May. Will follow up with Dr. Bary Castilla next week.   Patient was seen and examined by Jerrell Belfast, MD, and note scribed, in part,  by Renaldo Fiddler, CMA and reviewed by me with patient. I have reviewed the document for accuracy and  completeness and I agree with above. Jerrell Belfast, MD   Margarita Rana, MD

## 2015-02-21 ENCOUNTER — Ambulatory Visit (INDEPENDENT_AMBULATORY_CARE_PROVIDER_SITE_OTHER): Payer: Medicare Other | Admitting: General Surgery

## 2015-02-21 ENCOUNTER — Encounter: Payer: Self-pay | Admitting: General Surgery

## 2015-02-21 VITALS — BP 150/76 | HR 74 | Resp 14 | Ht 67.0 in | Wt 225.0 lb

## 2015-02-21 DIAGNOSIS — C50912 Malignant neoplasm of unspecified site of left female breast: Secondary | ICD-10-CM | POA: Diagnosis not present

## 2015-02-21 DIAGNOSIS — T792XXA Traumatic secondary and recurrent hemorrhage and seroma, initial encounter: Secondary | ICD-10-CM

## 2015-02-21 DIAGNOSIS — T888XXA Other specified complications of surgical and medical care, not elsewhere classified, initial encounter: Secondary | ICD-10-CM

## 2015-02-21 MED ORDER — LETROZOLE 2.5 MG PO TABS: 2.5000 mg | ORAL_TABLET | Freq: Every day | ORAL | Status: DC

## 2015-02-21 NOTE — Progress Notes (Signed)
Patient ID: Robin Ashley, female   DOB: 01-04-1932, 81 y.o.   MRN: [id number]  Chief Complaint  Patient presents with  . Follow-up    mammogram    HPI Robin Ashley is a 81 y.o. female who presents for a breast evaluation. The most recent mammogram was done on 02/10/15.  Patient does perform regular self breast checks and gets regular mammograms done.    HPI  Past Medical History  Diagnosis Date  . Diabetes mellitus without complication   . Hypertension   . Hyperlipidemia   . Foot drop, right October 2014    noted post mastectomy, conservative treatment was instituted with resolution, mild edema.  . 174.06 March 2013    Right breast, T1c, N0, 12 mm; ER PR positive, HER-2/neu not over expressed. Not a candidate for adjuvant chemotherapy per [facility] tumor board.    Past Surgical History  Procedure Laterality Date  . Intestinal balloon removed  1991    Likely surgery for diverticulitis.  . Eye surgery Bilateral 04/2012  . Breast surgery Left 03-15-13    left mastectomywith SN biopsy  . Small bowel follow thru  05/05/14    Jejunal diverticuli noted on small bowel follow-through.  . Colonoscopy    . Colon surgery  1991    Likely segmental resection for diverticulitis based on patient description.  . Breast biopsy Left 02/25/13    positive  . Mastectomy Left     positive    Family History  Problem Relation Age of Onset  . Cancer Brother     colon  . Diabetes Brother   . Breast cancer Neg Hx   . Heart disease Brother   . Diabetes Brother     Social History Social History  Substance Use Topics  . Smoking status: Never Smoker   . Smokeless tobacco: Never Used  . Alcohol Use: No    No Known Allergies  Current Outpatient Prescriptions  Medication Sig Dispense Refill  . amLODipine (NORVASC) 10 MG tablet Take 10 mg by mouth daily.    Marland Kitchen atorvastatin (LIPITOR) 80 MG tablet Take 80 mg by mouth daily.    . ferrous sulfate 325 (65 FE) MG tablet   5  . furosemide  (LASIX) 20 MG tablet Take 20 mg by mouth daily.    Marland Kitchen glucose blood test strip     . lisinopril (PRINIVIL,ZESTRIL) 10 MG tablet Take 10 mg by mouth daily.    . meloxicam (MOBIC) 15 MG tablet Take 1 tablet (15 mg total) by mouth daily. 90 tablet 3  . metFORMIN (GLUCOPHAGE) 500 MG tablet Take 500 mg by mouth 2 (two) times daily with a meal.    . Multiple Vitamins-Minerals (CENTRUM SILVER PO) Take 1 tablet by mouth daily.    Marland Kitchen omeprazole (PRILOSEC) 20 MG capsule Take 1 capsule (20 mg total) by mouth daily. 90 capsule 3  . polyethylene glycol powder (GLYCOLAX/MIRALAX) powder   0  . traZODone (DESYREL) 50 MG tablet Take 50 mg by mouth at bedtime.    Marland Kitchen letrozole (FEMARA) 2.5 MG tablet Take 1 tablet (2.5 mg total) by mouth daily. 90 tablet 4   No current facility-administered medications for this visit.    Review of Systems Review of Systems  Blood pressure 150/76, pulse 74, resp. rate 14, height $RemoveBe'5\' 7"'XCoGhfocv$  (1.702 m), weight 225 lb (102.059 kg).  Physical Exam Physical Exam  Constitutional: She is oriented to person, place, and time. She appears well-developed and well-nourished.  Eyes: Conjunctivae are normal.  No scleral icterus.  Neck: Neck supple.  Cardiovascular: Normal rate and regular rhythm.   Murmur heard.  Systolic murmur is present with a grade of 1/6  Pulmonary/Chest: Effort normal and breath sounds normal. Right breast exhibits no inverted nipple, no mass, no nipple discharge, no skin change and no tenderness.    Left mastectomy is well healed. Dog ears both sides  Lymphadenopathy:    She has no cervical adenopathy.    She has no axillary adenopathy.  Neurological: She is alert and oriented to person, place, and time.  Skin: Skin is warm and dry.    Data Reviewed Right breast mammogram dated 02/10/2015 reviewed. No interval change. BI-RADS-1.  Assessment    Doing well now 2 years status post mastectomy.    Plan    Indication for ongoing antiestrogen therapy reviewed.    Rx Femara was refilled .  Patient will be asked to return to the office in one year with a right screening mammogram. PCP:  Etheleen Mayhew 02/21/2015, 9:21 PM

## 2015-02-21 NOTE — Patient Instructions (Signed)
Patient will be asked to return to the office in one year with a right screening mammogram. 

## 2015-03-04 ENCOUNTER — Other Ambulatory Visit: Payer: Self-pay | Admitting: Family Medicine

## 2015-03-04 DIAGNOSIS — E1122 Type 2 diabetes mellitus with diabetic chronic kidney disease: Secondary | ICD-10-CM

## 2015-03-04 DIAGNOSIS — E7800 Pure hypercholesterolemia, unspecified: Secondary | ICD-10-CM

## 2015-03-15 IMAGING — US US BIOPSY BREAST CORE W/ IMAGING
1 series · 13 of 14 positions shown · non-contrast
Comparison: Previous exams.

REASON FOR EXAM: LT BRST MASS
COMMENTS:
CLINICAL DATA: The patient returns to have biopsy of mass in the
left breast, 11 o'clock location.

EXAM:
ULTRASOUND CORE BIOPSY

[Series 1: us biopsy breast core w/ imaging · 0.08mm/px · 13 of 14 slices shown]
[im 1/14]
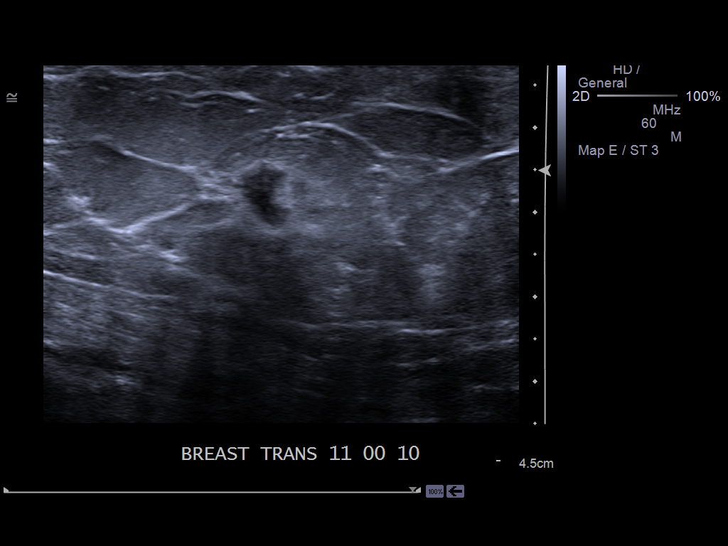
[im 2/14]
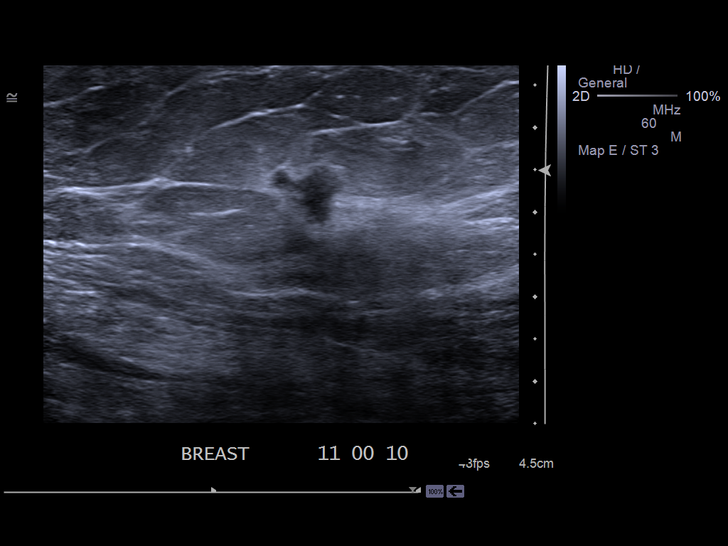
[im 3/14]
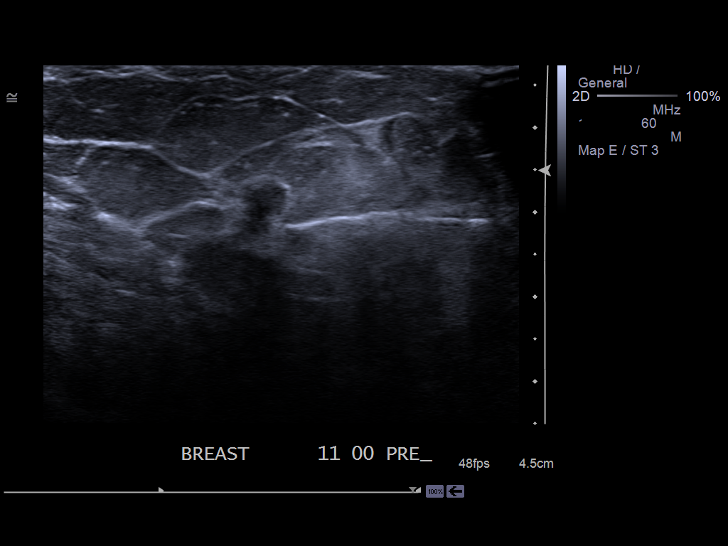
[im 4/14]
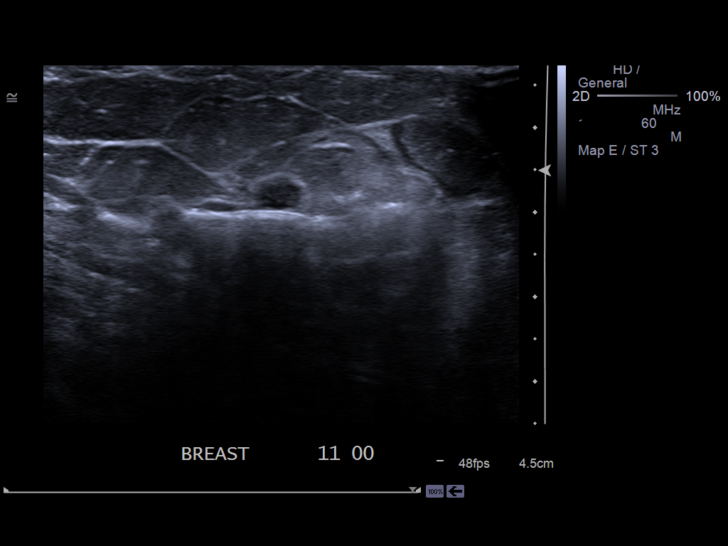
[im 5/14]
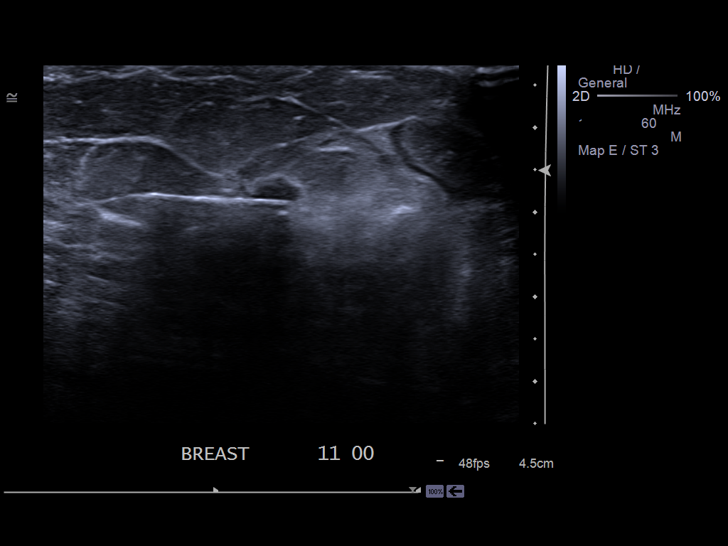
[im 6/14]
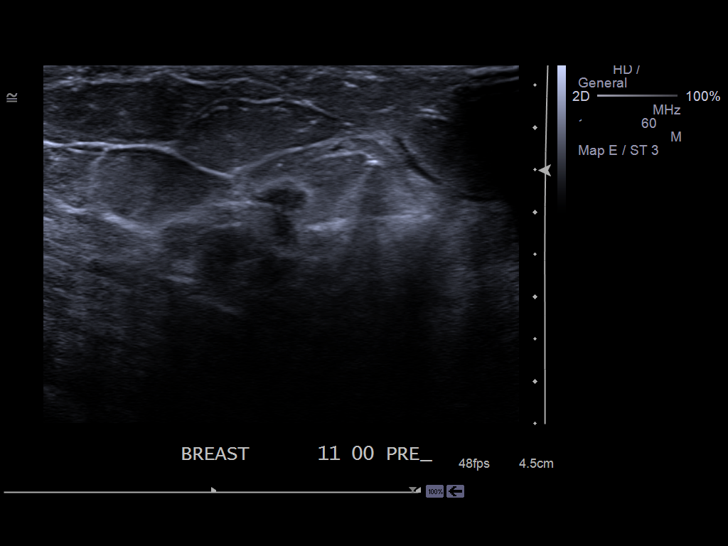
[im 8/14]
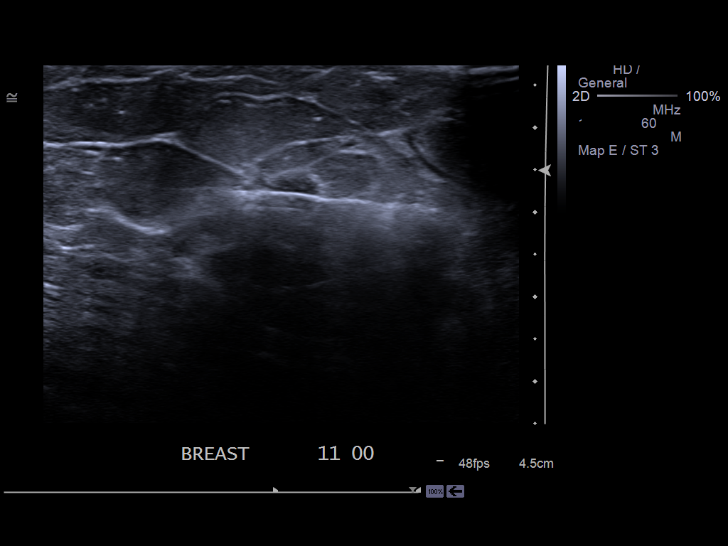
[im 9/14]
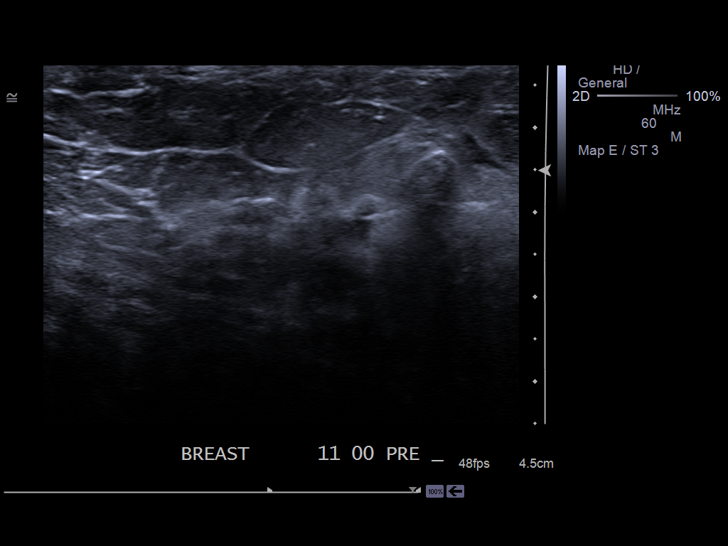
[im 10/14]
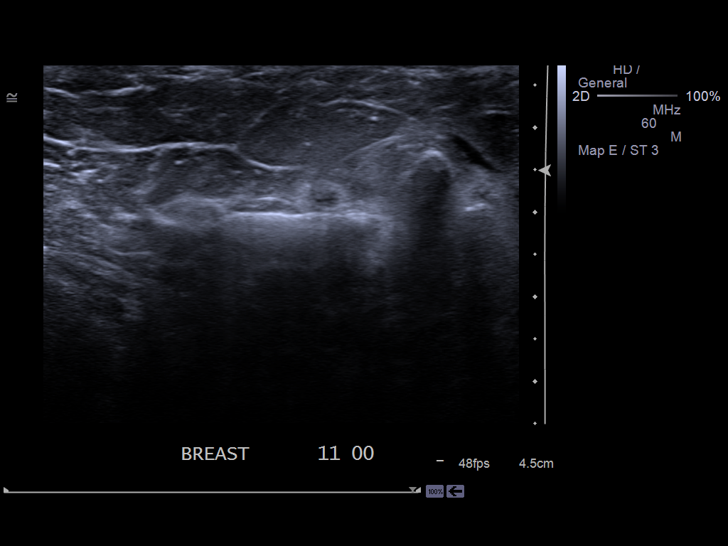
[im 11/14]
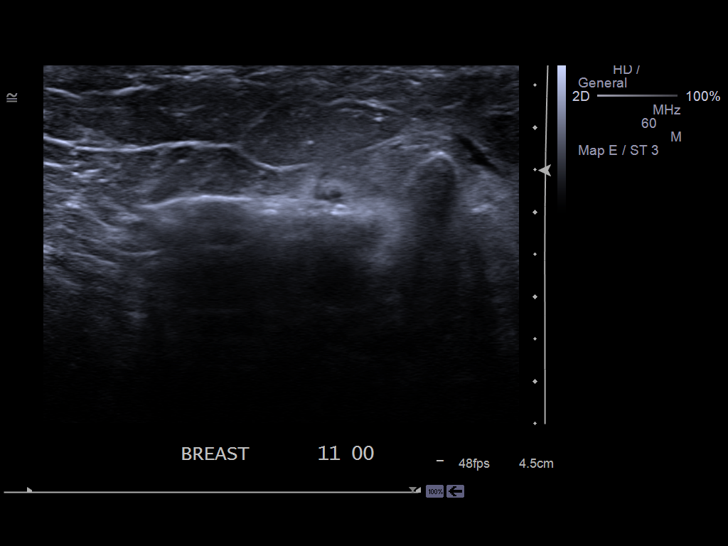
[im 12/14]
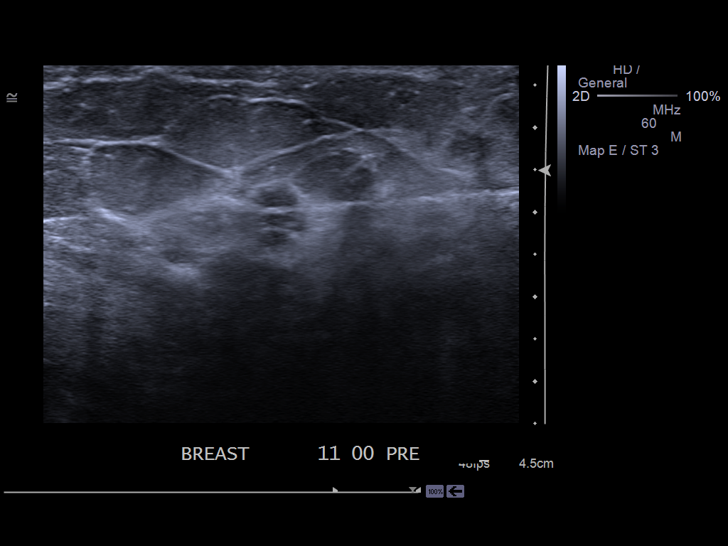
[im 13/14]
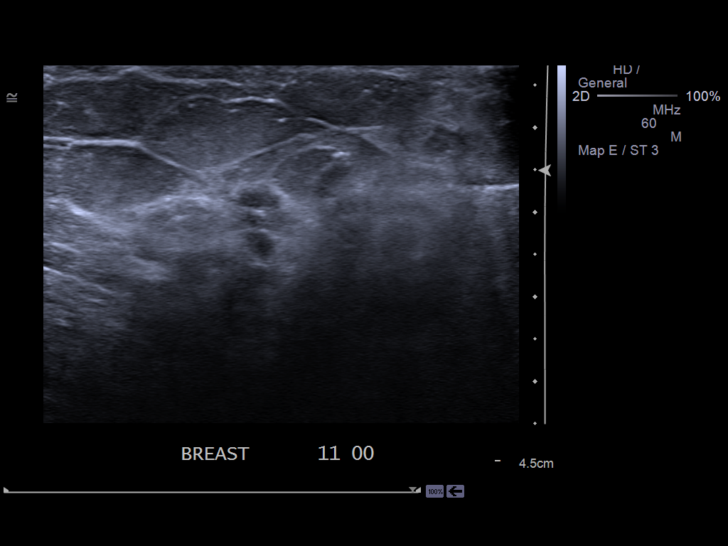
[im 14/14]
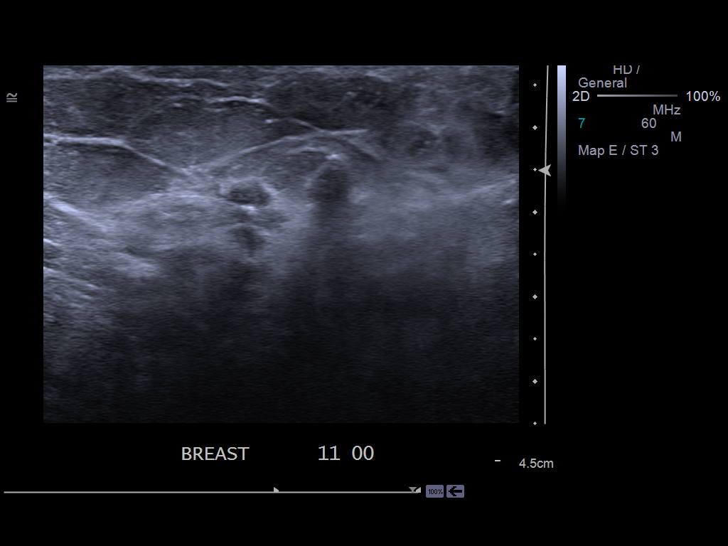

[13 of 14 positions shown; findings below may reference images not displayed]

PROCEDURE:     MAM - MAM US CORE BIOPSY LEFT  - February 24, 2013  [DATE]

ADDENDUM:
The final pathological diagnosis is invasive mammary carcinoma. This
is concordant with the imaging findings. Surgical consultation for
excision is recommended.

The final pathological diagnosis and recommendation were discussed
with the patient by telephone on 02/26/2013. Her questions were
answered. She reported minimal bruising at the biopsy site with no
pain or palpable hematoma.

The final pathological diagnosis and recommendation were also
discussed with Dr. Chai. She will arrange surgical consultation
for the patient.
PROCEDURE:
I met with the patient and we discussed the procedure of
ultrasound-guided biopsy, including benefits and alternatives. We
discussed the high likelihood of a successful procedure. We
discussed the risks of the procedure including infection, bleeding,
tissue injury, clip migration, and inadequate sampling. Informed
written consent was given.

Using sterile technique and 2% Lidocaine as local anesthetic, under
direct ultrasound visualization, a 12 gauge vacuum-assisteddevice
was used to perform biopsy of mass in the 11 o'clock location of the
left breast using a lateral approach. At the conclusion of the
procedure, a wing shaped tissue marker clip was deployed into the
biopsy cavity. Follow-up 2-view mammogram was performed and dictated
separately.

The usual time-out protocol was performed immediately prior to the
procedure.
IMPRESSION: Ultrasound-guided biopsy of left breast. No apparent complications.

## 2015-03-28 ENCOUNTER — Other Ambulatory Visit: Payer: Self-pay | Admitting: Family Medicine

## 2015-03-28 DIAGNOSIS — D509 Iron deficiency anemia, unspecified: Secondary | ICD-10-CM

## 2015-03-28 NOTE — Telephone Encounter (Signed)
LOV 02/17/2015, Last CBC 10/13/2014; Hgb and Hct low. Renaldo Fiddler, CMA

## 2015-04-07 DIAGNOSIS — H52223 Regular astigmatism, bilateral: Secondary | ICD-10-CM | POA: Diagnosis not present

## 2015-04-07 DIAGNOSIS — Z961 Presence of intraocular lens: Secondary | ICD-10-CM | POA: Diagnosis not present

## 2015-04-07 DIAGNOSIS — I10 Essential (primary) hypertension: Secondary | ICD-10-CM | POA: Diagnosis not present

## 2015-04-07 DIAGNOSIS — E119 Type 2 diabetes mellitus without complications: Secondary | ICD-10-CM | POA: Diagnosis not present

## 2015-04-07 DIAGNOSIS — H524 Presbyopia: Secondary | ICD-10-CM | POA: Diagnosis not present

## 2015-04-07 DIAGNOSIS — H5203 Hypermetropia, bilateral: Secondary | ICD-10-CM | POA: Diagnosis not present

## 2015-06-19 ENCOUNTER — Ambulatory Visit (INDEPENDENT_AMBULATORY_CARE_PROVIDER_SITE_OTHER): Payer: Medicare Other | Admitting: Family Medicine

## 2015-06-19 ENCOUNTER — Encounter: Payer: Self-pay | Admitting: Family Medicine

## 2015-06-19 VITALS — BP 154/78 | HR 84 | Temp 97.8°F | Resp 16 | Wt 226.0 lb

## 2015-06-19 DIAGNOSIS — E1122 Type 2 diabetes mellitus with diabetic chronic kidney disease: Secondary | ICD-10-CM

## 2015-06-19 DIAGNOSIS — D509 Iron deficiency anemia, unspecified: Secondary | ICD-10-CM

## 2015-06-19 DIAGNOSIS — Z23 Encounter for immunization: Secondary | ICD-10-CM

## 2015-06-19 DIAGNOSIS — M79609 Pain in unspecified limb: Secondary | ICD-10-CM

## 2015-06-19 DIAGNOSIS — R202 Paresthesia of skin: Secondary | ICD-10-CM | POA: Diagnosis not present

## 2015-06-19 DIAGNOSIS — N183 Chronic kidney disease, stage 3 unspecified: Secondary | ICD-10-CM

## 2015-06-19 LAB — POCT GLYCOSYLATED HEMOGLOBIN (HGB A1C)
Est. average glucose Bld gHb Est-mCnc: 131
Hemoglobin A1C: 6.2

## 2015-06-19 MED ORDER — ASPIRIN 81 MG PO TABS: 81.0000 mg | ORAL_TABLET | Freq: Every day | ORAL | Status: DC

## 2015-06-19 NOTE — Progress Notes (Signed)
Subjective:    Patient ID: Robin Ashley, female    DOB: [birth year], 82 y.o.   MRN: [id number]  Diabetes She presents for her follow-up (Last A1C 02/17/2015 and was 6.2%) diabetic visit. She has type 2 diabetes mellitus. There are no hypoglycemic associated symptoms. Pertinent negatives for hypoglycemia include no headaches or sweats. Associated symptoms include foot paresthesias (right foot; unchanged per pt). Pertinent negatives for diabetes include no blurred vision, no chest pain, no fatigue, no foot ulcerations, no polydipsia, no polyphagia, no polyuria, no visual change, no weakness and no weight loss. Risk factors for coronary artery disease include diabetes mellitus, dyslipidemia, hypertension, obesity and post-menopausal. Current diabetic treatment includes oral agent (monotherapy) (Metformin 500 mg BID). She is compliant with treatment all of the time. She is following a generally healthy diet. She rarely participates in exercise. Home blood sugar record trend: BS not being checked. An ACE inhibitor/angiotensin II receptor blocker is being taken. Eye exam is current (September; Dr. Matilde Sprang).  Hypertension This is a chronic problem. Condition status: BP elevated today due tp pt not taking medication. Associated symptoms include peripheral edema. Pertinent negatives include no anxiety, blurred vision, chest pain, headaches, malaise/fatigue, neck pain, orthopnea, palpitations, shortness of breath or sweats. Treatments tried: Amlodipine 10 mg, Lisinopril 10 mg. There are no compliance problems.   Anemia Last hgb was checked 10/13/2014 and was 11.2.  Pt currently taking ferrous sulfate 325 mg po qd.   Needs to be rechecked today.      Review of Systems  Constitutional: Negative for weight loss, malaise/fatigue and fatigue.  Eyes: Negative for blurred vision.  Respiratory: Negative for shortness of breath.   Cardiovascular: Negative for chest pain, palpitations and orthopnea.  Endocrine:  Negative for polydipsia, polyphagia and polyuria.  Musculoskeletal: Negative for neck pain.  Neurological: Negative for weakness and headaches.   BP 154/78 mmHg  Pulse 84  Temp(Src) 97.8 F (36.6 C) (Oral)  Resp 16  Wt 226 lb (102.513 kg)   Patient Active Problem List   Diagnosis Date Noted  . Diverticulosis 02/17/2015  . Adaptation reaction 02/16/2015  . At risk for falling 02/16/2015  . Benign hypertension with CKD (chronic kidney disease) stage III 02/16/2015  . Adult BMI 30+ 02/16/2015  . Chronic kidney disease (CKD), stage III (moderate) 02/16/2015  . CN (constipation) 02/16/2015  . Diabetes ([place]) 02/16/2015  . Accumulation of fluid in tissues 02/16/2015  . H/O gastric ulcer 02/16/2015  . Hypercholesteremia 02/16/2015  . Cannot sleep 02/16/2015  . Gonalgia 02/16/2015  . Cramps of lower extremity 02/16/2015  . Fungal infection of toenail 02/16/2015  . Pain in shoulder 02/16/2015  . Arthritis 11/22/2014  . Helicobacter pylori gastrointestinal tract infection 05/18/2014  . Anemia, iron deficiency 03/27/2014  . Bilateral lower extremity edema 02/22/2014  . Breast cancer ([place]) 03/04/2013   Past Medical History  Diagnosis Date  . Diabetes mellitus without complication ([place])   . Hypertension   . Hyperlipidemia   . Foot drop, right October 2014    noted post mastectomy, conservative treatment was instituted with resolution, mild edema.  . 174.06 March 2013    Right breast, T1c, N0, 12 mm; ER PR positive, HER-2/neu not over expressed. Not a candidate for adjuvant chemotherapy per [facility] tumor board.   Current Outpatient Prescriptions on File Prior to Visit  Medication Sig  . amLODipine (NORVASC) 10 MG tablet Take 10 mg by mouth daily.  Marland Kitchen atorvastatin (LIPITOR) 80 MG tablet TAKE 1 TABLET DAILY  . ferrous sulfate  325 (65 FE) MG tablet Take 1 tablet by mouth twice a day  . furosemide (LASIX) 20 MG tablet Take 20 mg by mouth daily.  Marland Kitchen glucose blood test strip   . letrozole  (FEMARA) 2.5 MG tablet Take 1 tablet (2.5 mg total) by mouth daily.  Marland Kitchen lisinopril (PRINIVIL,ZESTRIL) 10 MG tablet Take 10 mg by mouth daily.  . meloxicam (MOBIC) 15 MG tablet Take 1 tablet (15 mg total) by mouth daily.  . metFORMIN (GLUCOPHAGE) 500 MG tablet TAKE 1 TABLET TWICE A DAY  . Multiple Vitamins-Minerals (CENTRUM SILVER PO) Take 1 tablet by mouth daily.  Marland Kitchen omeprazole (PRILOSEC) 20 MG capsule Take 1 capsule (20 mg total) by mouth daily.  . polyethylene glycol powder (GLYCOLAX/MIRALAX) powder   . traZODone (DESYREL) 50 MG tablet Take 50 mg by mouth at bedtime.   No current facility-administered medications on file prior to visit.   No Known Allergies Past Surgical History  Procedure Laterality Date  . Intestinal balloon removed  1991    Likely surgery for diverticulitis.  . Eye surgery Bilateral 04/2012  . Breast surgery Left 03-15-13    left mastectomywith SN biopsy  . Small bowel follow thru  05/05/14    Jejunal diverticuli noted on small bowel follow-through.  . Colonoscopy    . Colon surgery  1991    Likely segmental resection for diverticulitis based on patient description.  . Breast biopsy Left 02/25/13    positive  . Mastectomy Left     positive   Social History   Social History  . Marital Status: Widowed    Spouse Name: N/A  . Number of Children: N/A  . Years of Education: N/A   Occupational History  . Not on file.   Social History Main Topics  . Smoking status: Never Smoker   . Smokeless tobacco: Never Used  . Alcohol Use: No  . Drug Use: No  . Sexual Activity: Not on file   Other Topics Concern  . Not on file   Social History Narrative   Family History  Problem Relation Age of Onset  . Cancer Brother     colon  . Diabetes Brother   . Breast cancer Neg Hx   . Heart disease Brother   . Diabetes Brother       Objective:   Physical Exam  Constitutional: She appears well-developed and well-nourished.  Cardiovascular: Normal rate and regular  rhythm.   Pulmonary/Chest: Effort normal and breath sounds normal.  Neurological:  Monofilament normal  Psychiatric: She has a normal mood and affect. Her behavior is normal.   BP 154/78 mmHg  Pulse 84  Temp(Src) 97.8 F (36.6 C) (Oral)  Resp 16  Wt 226 lb (102.513 kg)     Assessment & Plan:  1. Type 2 diabetes mellitus with diabetic chronic kidney disease, unspecified CKD stage, unspecified long term insulin use status (HCC) Stable. Could not obtain sample for urine microalbumin. Will obtain at FU OV. - POCT glycosylated hemoglobin (Hb A1C) - aspirin 81 MG tablet; Take 1 tablet (81 mg total) by mouth daily.  Dispense: 30 tablet; Refill: 0  Results for orders placed or performed in visit on 06/19/15  POCT glycosylated hemoglobin (Hb A1C)  Result Value Ref Range   Hemoglobin A1C 6.2    Est. average glucose Bld gHb Est-mCnc 131     2. Need for pneumococcal vaccination Administered today. - Pneumococcal polysaccharide vaccine 23-valent greater than or equal to 2yo subcutaneous/IM  3. Anemia, iron deficiency  Check labs as below. FU pending labs. Change Mobic to PRN. Consider stopping Prilosec at next ov.  - Iron and TIBC - Ferritin - CBC with Differential/Platelet  4. Paresthesia and pain of right extremity Stable. Will check labs. FU pending labs. Monofilament normal. - TSH - Vitamin B12 Diabetic Foot Exam - Simple   Simple Foot Form  Diabetic Foot exam was performed with the following findings:  Yes 06/19/2015  8:52 AM  Visual Inspection  No deformities, no ulcerations, no other skin breakdown bilaterally:  Yes  Sensation Testing  Intact to touch and monofilament testing bilaterally:  Yes  Pulse Check  Posterior Tibialis and Dorsalis pulse intact bilaterally:  Yes  Comments      5. Chronic kidney disease (CKD), stage III (moderate) FU pending labs. - Comprehensive metabolic panel   Patient seen and examined by Jerrell Belfast, MD, and note scribed by Renaldo Fiddler, CMA. I have reviewed the document for accuracy and completeness and I agree with above. Jerrell Belfast, MD   Margarita Rana, MD

## 2015-06-20 LAB — IRON AND TIBC
IRON SATURATION: 18 % (ref 15–55)
IRON: 62 ug/dL (ref 27–139)
Total Iron Binding Capacity: 340 ug/dL (ref 250–450)
UIBC: 278 ug/dL (ref 118–369)

## 2015-06-20 LAB — COMPREHENSIVE METABOLIC PANEL
ALBUMIN: 4.3 g/dL (ref 3.5–4.7)
ALK PHOS: 141 IU/L — AB (ref 39–117)
ALT: 14 IU/L (ref 0–32)
AST: 17 IU/L (ref 0–40)
Albumin/Globulin Ratio: 1.5 (ref 1.1–2.5)
BILIRUBIN TOTAL: 0.4 mg/dL (ref 0.0–1.2)
BUN / CREAT RATIO: 9 — AB (ref 11–26)
BUN: 12 mg/dL (ref 8–27)
CHLORIDE: 99 mmol/L (ref 96–106)
CO2: 22 mmol/L (ref 18–29)
Calcium: 9.3 mg/dL (ref 8.7–10.3)
Creatinine, Ser: 1.32 mg/dL — ABNORMAL HIGH (ref 0.57–1.00)
GFR calc Af Amer: 44 mL/min/{1.73_m2} — ABNORMAL LOW (ref >59)
GFR calc non Af Amer: 38 mL/min/{1.73_m2} — ABNORMAL LOW (ref >59)
GLUCOSE: 115 mg/dL — AB (ref 65–99)
Globulin, Total: 2.9 g/dL (ref 1.5–4.5)
Potassium: 4.1 mmol/L (ref 3.5–5.2)
SODIUM: 141 mmol/L (ref 134–144)
Total Protein: 7.2 g/dL (ref 6.0–8.5)

## 2015-06-20 LAB — CBC WITH DIFFERENTIAL/PLATELET
BASOS ABS: 0.1 10*3/uL (ref 0.0–0.2)
BASOS: 1 %
EOS (ABSOLUTE): 0.3 10*3/uL (ref 0.0–0.4)
Eos: 3 %
Hematocrit: 36.4 % (ref 34.0–46.6)
Hemoglobin: 12.2 g/dL (ref 11.1–15.9)
IMMATURE GRANULOCYTES: 0 %
Immature Grans (Abs): 0 10*3/uL (ref 0.0–0.1)
Lymphocytes Absolute: 3 10*3/uL (ref 0.7–3.1)
Lymphs: 24 %
MCH: 30.3 pg (ref 26.6–33.0)
MCHC: 33.5 g/dL (ref 31.5–35.7)
MCV: 91 fL (ref 79–97)
MONOS ABS: 1.3 10*3/uL — AB (ref 0.1–0.9)
Monocytes: 11 %
NEUTROS PCT: 61 %
Neutrophils Absolute: 7.7 10*3/uL — ABNORMAL HIGH (ref 1.4–7.0)
PLATELETS: 316 10*3/uL (ref 150–379)
RBC: 4.02 x10E6/uL (ref 3.77–5.28)
RDW: 14.2 % (ref 12.3–15.4)
WBC: 12.4 10*3/uL — AB (ref 3.4–10.8)

## 2015-06-20 LAB — VITAMIN B12: Vitamin B-12: 500 pg/mL (ref 211–946)

## 2015-06-20 LAB — TSH: TSH: 1.73 u[IU]/mL (ref 0.450–4.500)

## 2015-06-20 LAB — FERRITIN: FERRITIN: 56 ng/mL (ref 15–150)

## 2015-07-09 ENCOUNTER — Other Ambulatory Visit: Payer: Self-pay | Admitting: Family Medicine

## 2015-07-09 DIAGNOSIS — D509 Iron deficiency anemia, unspecified: Secondary | ICD-10-CM

## 2015-07-26 ENCOUNTER — Other Ambulatory Visit: Payer: Self-pay | Admitting: Family Medicine

## 2015-07-26 DIAGNOSIS — G4700 Insomnia, unspecified: Secondary | ICD-10-CM

## 2015-08-24 ENCOUNTER — Other Ambulatory Visit: Payer: Self-pay | Admitting: Family Medicine

## 2015-08-24 DIAGNOSIS — R600 Localized edema: Secondary | ICD-10-CM

## 2015-08-25 DIAGNOSIS — I10 Essential (primary) hypertension: Secondary | ICD-10-CM | POA: Diagnosis not present

## 2015-08-25 DIAGNOSIS — E782 Mixed hyperlipidemia: Secondary | ICD-10-CM | POA: Diagnosis not present

## 2015-08-25 DIAGNOSIS — E119 Type 2 diabetes mellitus without complications: Secondary | ICD-10-CM | POA: Diagnosis not present

## 2015-08-25 DIAGNOSIS — G4733 Obstructive sleep apnea (adult) (pediatric): Secondary | ICD-10-CM | POA: Diagnosis not present

## 2015-10-16 ENCOUNTER — Ambulatory Visit (INDEPENDENT_AMBULATORY_CARE_PROVIDER_SITE_OTHER): Payer: Medicare Other | Admitting: Family Medicine

## 2015-10-16 ENCOUNTER — Encounter: Payer: Self-pay | Admitting: Family Medicine

## 2015-10-16 VITALS — BP 136/72 | HR 96 | Temp 98.0°F | Resp 20 | Wt 221.0 lb

## 2015-10-16 DIAGNOSIS — N183 Chronic kidney disease, stage 3 (moderate): Secondary | ICD-10-CM

## 2015-10-16 DIAGNOSIS — I129 Hypertensive chronic kidney disease with stage 1 through stage 4 chronic kidney disease, or unspecified chronic kidney disease: Secondary | ICD-10-CM | POA: Diagnosis not present

## 2015-10-16 DIAGNOSIS — E1122 Type 2 diabetes mellitus with diabetic chronic kidney disease: Secondary | ICD-10-CM | POA: Diagnosis not present

## 2015-10-16 DIAGNOSIS — E7800 Pure hypercholesterolemia, unspecified: Secondary | ICD-10-CM | POA: Diagnosis not present

## 2015-10-16 LAB — POCT UA - MICROALBUMIN: Microalbumin Ur, POC: >20 mg/L

## 2015-10-16 LAB — POCT GLYCOSYLATED HEMOGLOBIN (HGB A1C)
Est. average glucose Bld gHb Est-mCnc: 137
Hemoglobin A1C: 6.4

## 2015-10-16 NOTE — Progress Notes (Signed)
Subjective:    Patient ID: Robin Ashley, female    DOB: 1932-02-20, 82 y.o.   MRN: [id number]  Diabetes She presents for her follow-up (Last A1C was checked 06/19/2015 and was 6.2%) diabetic visit. She has type 2 diabetes mellitus. There are no hypoglycemic associated symptoms. Associated symptoms include foot paresthesias (unchanged per pt). Pertinent negatives for diabetes include no blurred vision, no chest pain, no fatigue, no foot ulcerations, no polydipsia, no polyphagia, no polyuria, no visual change and no weakness. Symptoms are stable. Risk factors for coronary artery disease include diabetes mellitus, dyslipidemia, hypertension, obesity, post-menopausal and family history. Current diabetic treatment includes oral agent (monotherapy) (Metformin 500 mg BID). She is compliant with treatment all of the time. She is following a generally healthy diet. She rarely participates in exercise. Home blood sugar record trend: not being checked. An ACE inhibitor/angiotensin II receptor blocker is being taken. Eye exam is current.  Hypertension This is a chronic problem. Pertinent negatives include no blurred vision, chest pain, malaise/fatigue, neck pain, orthopnea, palpitations, peripheral edema or shortness of breath. Treatments tried: currently taking Amlodipine 10 mg, Lisinopril 10 mg. There are no compliance problems.       Review of Systems  Constitutional: Negative for malaise/fatigue and fatigue.  Eyes: Negative for blurred vision.  Respiratory: Negative for shortness of breath.   Cardiovascular: Negative for chest pain, palpitations and orthopnea.  Endocrine: Negative for polydipsia, polyphagia and polyuria.  Musculoskeletal: Negative for neck pain.  Neurological: Negative for weakness.   BP 136/72 mmHg  Pulse 96  Temp(Src) 98 F (36.7 C) (Oral)  Resp 20  Wt 221 lb (100.245 kg)   Patient Active Problem List   Diagnosis Date Noted  . Paresthesia and pain of right extremity  06/19/2015  . Diverticulosis 02/17/2015  . Adaptation reaction 02/16/2015  . At risk for falling 02/16/2015  . Benign hypertension with CKD (chronic kidney disease) stage III 02/16/2015  . Adult BMI 30+ 02/16/2015  . Chronic kidney disease (CKD), stage III (moderate) 02/16/2015  . CN (constipation) 02/16/2015  . Diabetes ([place]) 02/16/2015  . Accumulation of fluid in tissues 02/16/2015  . H/O gastric ulcer 02/16/2015  . Hypercholesteremia 02/16/2015  . Insomnia 02/16/2015  . Gonalgia 02/16/2015  . Cramps of lower extremity 02/16/2015  . Fungal infection of toenail 02/16/2015  . Pain in shoulder 02/16/2015  . Arthritis 11/22/2014  . Helicobacter pylori gastrointestinal tract infection 05/18/2014  . Anemia, iron deficiency 03/27/2014  . Bilateral lower extremity edema 02/22/2014  . Breast cancer ([place]) 03/04/2013   Past Medical History  Diagnosis Date  . Diabetes mellitus without complication ([place])   . Hypertension   . Hyperlipidemia   . Foot drop, right October 2014    noted post mastectomy, conservative treatment was instituted with resolution, mild edema.  . 174.06 March 2013    Right breast, T1c, N0, 12 mm; ER PR positive, HER-2/neu not over expressed. Not a candidate for adjuvant chemotherapy per [facility] tumor board.   Current Outpatient Prescriptions on File Prior to Visit  Medication Sig  . amLODipine (NORVASC) 10 MG tablet Take 10 mg by mouth daily.  Marland Kitchen aspirin 81 MG tablet Take 1 tablet (81 mg total) by mouth daily.  Marland Kitchen atorvastatin (LIPITOR) 80 MG tablet TAKE 1 TABLET DAILY  . [facility] IRON 325 (65 Fe) MG tablet TAKE 1 TABLET BY MOUTH TWICE A DAY  . furosemide (LASIX) 20 MG tablet TAKE 1 TABLET DAILY  . glucose blood test strip   . letrozole (  FEMARA) 2.5 MG tablet Take 1 tablet (2.5 mg total) by mouth daily.  Marland Kitchen lisinopril (PRINIVIL,ZESTRIL) 10 MG tablet Take 10 mg by mouth daily.  . meloxicam (MOBIC) 15 MG tablet Take 1 tablet (15 mg total) by mouth daily.  . metFORMIN  (GLUCOPHAGE) 500 MG tablet TAKE 1 TABLET TWICE A DAY  . Multiple Vitamins-Minerals (CENTRUM SILVER PO) Take 1 tablet by mouth daily.  Marland Kitchen omeprazole (PRILOSEC) 20 MG capsule Take 1 capsule (20 mg total) by mouth daily.  . polyethylene glycol powder (GLYCOLAX/MIRALAX) powder   . traZODone (DESYREL) 50 MG tablet TAKE 1 TABLET EVERY EVENING   No current facility-administered medications on file prior to visit.   No Known Allergies Past Surgical History  Procedure Laterality Date  . Intestinal balloon removed  1991    Likely surgery for diverticulitis.  . Eye surgery Bilateral 04/2012  . Breast surgery Left 03-15-13    left mastectomywith SN biopsy  . Small bowel follow thru  05/05/14    Jejunal diverticuli noted on small bowel follow-through.  . Colonoscopy    . Colon surgery  1991    Likely segmental resection for diverticulitis based on patient description.  . Breast biopsy Left 02/25/13    positive  . Mastectomy Left     positive   Social History   Social History  . Marital Status: Widowed    Spouse Name: N/A  . Number of Children: N/A  . Years of Education: N/A   Occupational History  . Not on file.   Social History Main Topics  . Smoking status: Never Smoker   . Smokeless tobacco: Never Used  . Alcohol Use: No  . Drug Use: No  . Sexual Activity: Not on file   Other Topics Concern  . Not on file   Social History Narrative   Family History  Problem Relation Age of Onset  . Cancer Brother     colon  . Diabetes Brother   . Breast cancer Neg Hx   . Heart disease Brother   . Diabetes Brother        Objective:   Physical Exam  Constitutional: She is oriented to person, place, and time. She appears well-developed and well-nourished.  Neurological: She is alert and oriented to person, place, and time.  Psychiatric: She has a normal mood and affect. Her behavior is normal. Judgment and thought content normal.  Upset that I am leaving.      BP 136/72 mmHg  Pulse  96  Temp(Src) 98 F (36.7 C) (Oral)  Resp 20  Wt 221 lb (100.245 kg)      Assessment & Plan:  1. Type 2 diabetes mellitus with diabetic chronic kidney disease, unspecified CKD stage, unspecified long term insulin use status (HCC) Stable. Continue current medications and FU with Tawanna Sat in 3 months.  Results for orders placed or performed in visit on 10/16/15  POCT glycosylated hemoglobin (Hb A1C)  Result Value Ref Range   Hemoglobin A1C 6.4    Est. average glucose Bld gHb Est-mCnc 137   POCT UA - Microalbumin  Result Value Ref Range   Microalbumin Ur, POC >20 mg/L    - POCT glycosylated hemoglobin (Hb A1C) - POCT UA - Microalbumin  2. Benign hypertension with CKD (chronic kidney disease) stage III Stable. Will check labs. FU pending results. - Comprehensive metabolic panel  3. Hypercholesteremia Due for labs. FU pending results. - Lipid panel    Patient seen and examined by Jerrell Belfast, MD, and  note scribed by Renaldo Fiddler, CMA.  I have reviewed the document for accuracy and completeness and I agree with above. Jerrell Belfast, MD   Margarita Rana, MD   Margarita Rana, MD

## 2015-10-17 DIAGNOSIS — I129 Hypertensive chronic kidney disease with stage 1 through stage 4 chronic kidney disease, or unspecified chronic kidney disease: Secondary | ICD-10-CM | POA: Diagnosis not present

## 2015-10-17 DIAGNOSIS — E7800 Pure hypercholesterolemia, unspecified: Secondary | ICD-10-CM | POA: Diagnosis not present

## 2015-10-17 DIAGNOSIS — N183 Chronic kidney disease, stage 3 (moderate): Secondary | ICD-10-CM | POA: Diagnosis not present

## 2015-10-18 ENCOUNTER — Telehealth: Payer: Self-pay

## 2015-10-18 LAB — LIPID PANEL
CHOL/HDL RATIO: 3.2 ratio (ref 0.0–4.4)
Cholesterol, Total: 138 mg/dL (ref 100–199)
HDL: 43 mg/dL (ref >39)
LDL Calculated: 64 mg/dL (ref 0–99)
TRIGLYCERIDES: 154 mg/dL — AB (ref 0–149)
VLDL Cholesterol Cal: 31 mg/dL (ref 5–40)

## 2015-10-18 LAB — COMPREHENSIVE METABOLIC PANEL
ALBUMIN: 3.9 g/dL (ref 3.5–4.7)
ALK PHOS: 133 IU/L — AB (ref 39–117)
ALT: 11 IU/L (ref 0–32)
AST: 16 IU/L (ref 0–40)
Albumin/Globulin Ratio: 1.6 (ref 1.2–2.2)
BILIRUBIN TOTAL: 0.4 mg/dL (ref 0.0–1.2)
BUN / CREAT RATIO: 8 — AB (ref 12–28)
BUN: 9 mg/dL (ref 8–27)
CALCIUM: 9.3 mg/dL (ref 8.7–10.3)
CO2: 22 mmol/L (ref 18–29)
Chloride: 101 mmol/L (ref 96–106)
Creatinine, Ser: 1.16 mg/dL — ABNORMAL HIGH (ref 0.57–1.00)
GFR, EST AFRICAN AMERICAN: 51 mL/min/{1.73_m2} — AB (ref >59)
GFR, EST NON AFRICAN AMERICAN: 44 mL/min/{1.73_m2} — AB (ref >59)
GLOBULIN, TOTAL: 2.4 g/dL (ref 1.5–4.5)
Glucose: 121 mg/dL — ABNORMAL HIGH (ref 65–99)
Potassium: 4.7 mmol/L (ref 3.5–5.2)
Sodium: 142 mmol/L (ref 134–144)
TOTAL PROTEIN: 6.3 g/dL (ref 6.0–8.5)

## 2015-10-18 NOTE — Telephone Encounter (Signed)
Patient advised as directed below. 

## 2015-10-18 NOTE — Telephone Encounter (Signed)
-----   Message from Margarita Rana, MD sent at 10/18/2015  9:00 AM EDT ----- Labs stable. Please notify patient. Thanks.

## 2015-10-30 ENCOUNTER — Other Ambulatory Visit: Payer: Self-pay | Admitting: Family Medicine

## 2015-11-28 ENCOUNTER — Other Ambulatory Visit: Payer: Self-pay | Admitting: Family Medicine

## 2015-11-28 DIAGNOSIS — D509 Iron deficiency anemia, unspecified: Secondary | ICD-10-CM

## 2015-11-29 ENCOUNTER — Other Ambulatory Visit: Payer: Self-pay | Admitting: Family Medicine

## 2015-11-30 ENCOUNTER — Encounter: Payer: Self-pay | Admitting: *Deleted

## 2015-12-12 ENCOUNTER — Other Ambulatory Visit: Payer: Self-pay | Admitting: Physician Assistant

## 2015-12-12 DIAGNOSIS — D509 Iron deficiency anemia, unspecified: Secondary | ICD-10-CM

## 2015-12-12 NOTE — Telephone Encounter (Signed)
Pt contacted office for refill request on the following medications:  [facility] IRON 325 (65 Fe) MG tablet.  [facility]  [address].  CB#[phone]/MW   This is a Dr Venia Minks pt/MW

## 2015-12-13 MED ORDER — FERROUS SULFATE 325 (65 FE) MG PO TABS: 325.0000 mg | ORAL_TABLET | Freq: Two times a day (BID) | ORAL | Status: DC

## 2015-12-13 NOTE — Telephone Encounter (Signed)
Pt called about her refill on iron and states that she out of this medication and will need about a 2 week supply sent to local pharmacy. [facility] [address] Thanks.

## 2015-12-14 ENCOUNTER — Other Ambulatory Visit: Payer: Self-pay | Admitting: Physician Assistant

## 2015-12-14 DIAGNOSIS — D509 Iron deficiency anemia, unspecified: Secondary | ICD-10-CM

## 2015-12-14 NOTE — Telephone Encounter (Signed)
Pt is requesting a 15 day supply of the Rx ferrous sulfate ([facility] IRON) 325 (65 FE) MG tablet sent the local [facility] [address] due to being completely out.  CB#[phone]/MW

## 2015-12-14 NOTE — Telephone Encounter (Signed)
Please review.  Thanks,  -Joseline 

## 2015-12-15 ENCOUNTER — Other Ambulatory Visit: Payer: Self-pay | Admitting: Family Medicine

## 2015-12-15 DIAGNOSIS — D509 Iron deficiency anemia, unspecified: Secondary | ICD-10-CM

## 2015-12-15 NOTE — Telephone Encounter (Signed)
Patient advised that [facility] [address]  Thanks,  -Joseline

## 2015-12-19 ENCOUNTER — Encounter: Payer: Self-pay | Admitting: Physician Assistant

## 2015-12-21 ENCOUNTER — Encounter: Payer: Self-pay | Admitting: Physician Assistant

## 2015-12-21 ENCOUNTER — Ambulatory Visit
Admission: RE | Admit: 2015-12-21 | Discharge: 2015-12-21 | Disposition: A | Discharge status: Home / Self Care | Payer: Medicare Other | Source: Ambulatory Visit | Attending: Physician Assistant | Admitting: Physician Assistant

## 2015-12-21 ENCOUNTER — Ambulatory Visit (INDEPENDENT_AMBULATORY_CARE_PROVIDER_SITE_OTHER): Payer: Medicare Other | Admitting: Physician Assistant

## 2015-12-21 VITALS — BP 140/80 | HR 78 | Temp 98.3°F | Resp 16 | Wt 223.8 lb

## 2015-12-21 DIAGNOSIS — R202 Paresthesia of skin: Secondary | ICD-10-CM

## 2015-12-21 DIAGNOSIS — R200 Anesthesia of skin: Secondary | ICD-10-CM

## 2015-12-21 DIAGNOSIS — M19012 Primary osteoarthritis, left shoulder: Secondary | ICD-10-CM

## 2015-12-21 DIAGNOSIS — D509 Iron deficiency anemia, unspecified: Secondary | ICD-10-CM

## 2015-12-21 DIAGNOSIS — M25512 Pain in left shoulder: Secondary | ICD-10-CM

## 2015-12-21 DIAGNOSIS — M7502 Adhesive capsulitis of left shoulder: Secondary | ICD-10-CM | POA: Diagnosis not present

## 2015-12-21 MED ORDER — FERROUS SULFATE 325 (65 FE) MG PO TABS: 325.0000 mg | ORAL_TABLET | Freq: Two times a day (BID) | ORAL | Status: DC

## 2015-12-21 NOTE — Progress Notes (Signed)
Patient: Robin Ashley Female    DOB: Aug 24, 1931   82 y.o.   MRN: [id number] Visit Date: 12/21/2015  Today's Provider: Mar Daring, PA-C   Chief Complaint  Patient presents with  . Shoulder Pain   Subjective:    HPI Shoulder Pain: Patient complaints of left shoulder pain.The pain is described as aching and numbing, she reports in the middle of the night she wakes up because her arm is numb.  The onset of the pain was a month ago.  The pain occurs every day and at night/ wakes from sleep at night. Symptoms are aggravated by trying to raise arm above head. Symptoms are diminished by  none. Limited activities include: She doesn't try to do anything when it hurts. She does not recall any specific injury just increasing stiffness over the last month then pins and needles sensation through lateral upper extremity.     No Known Allergies Current Meds  Medication Sig  . amLODipine (NORVASC) 5 MG tablet   . aspirin 81 MG tablet Take 1 tablet (81 mg total) by mouth daily.  Marland Kitchen atorvastatin (LIPITOR) 80 MG tablet TAKE 1 TABLET DAILY  . CVS IRON 325 (65 Fe) MG tablet TAKE 1 TABLET BY MOUTH TWICE A DAY  . furosemide (LASIX) 20 MG tablet TAKE 1 TABLET DAILY  . glucose blood test strip   . letrozole (FEMARA) 2.5 MG tablet Take 1 tablet (2.5 mg total) by mouth daily.  Marland Kitchen lisinopril (PRINIVIL,ZESTRIL) 20 MG tablet   . meloxicam (MOBIC) 15 MG tablet TAKE 1 TABLET DAILY  . metFORMIN (GLUCOPHAGE) 500 MG tablet TAKE 1 TABLET TWICE A DAY  . Multiple Vitamins-Minerals (CENTRUM SILVER PO) Take 1 tablet by mouth daily.  Marland Kitchen omeprazole (PRILOSEC) 20 MG capsule Take 1 capsule (20 mg total) by mouth daily.  . polyethylene glycol powder (GLYCOLAX/MIRALAX) powder   . traZODone (DESYREL) 50 MG tablet TAKE 1 TABLET EVERY EVENING    Review of Systems  Constitutional: Negative.   Respiratory: Negative.   Cardiovascular: Negative.   Gastrointestinal: Negative.   Musculoskeletal: Positive for  arthralgias (left shoulder). Negative for myalgias, back pain, joint swelling, neck pain and neck stiffness.  Neurological: Positive for numbness (left lateral upper extremity). Negative for dizziness, weakness, light-headedness and headaches.    Social History  Substance Use Topics  . Smoking status: Never Smoker   . Smokeless tobacco: Never Used  . Alcohol Use: No   Objective:   BP 140/80 mmHg  Pulse 78  Temp(Src) 98.3 F (36.8 C) (Oral)  Resp 16  Wt 223 lb 12.8 oz (101.515 kg)  Physical Exam  Constitutional: She appears well-developed and well-nourished. No distress.  Neck: Normal range of motion. Neck supple. No spinous process tenderness and no muscular tenderness present. Normal range of motion present.  Cardiovascular: Normal rate, regular rhythm and normal heart sounds.  Exam reveals no gallop and no friction rub.   No murmur heard. Pulmonary/Chest: Effort normal and breath sounds normal. No respiratory distress. She has no wheezes. She has no rales.  Musculoskeletal:       Right shoulder: Normal.       Left shoulder: She exhibits decreased range of motion (can only go about 90 degrees with abd. ER and IR at 90 degrees also limited by pain), tenderness (along supraspinatus/infrapinatus tendon region that radiates down through deltoid to insertion) and pain. She exhibits no bony tenderness, no swelling, no crepitus, no deformity, no spasm, normal pulse and normal  strength.       Right upper arm: Normal.       Left upper arm: Normal.  Skin: She is not diaphoretic.  Vitals reviewed.     Assessment & Plan:     1. Left shoulder pain Will get xray to R/O any bony abnormality or cause. Will f/u pending results of this. Will also refer to PT to start ROM and possibly strengthening exercises, main focus on ROM currently. Depending on xray results and response to PT may refer to orthopedics. - DG Shoulder Left; Future - Ambulatory referral to Physical Therapy  2. Numbness and  tingling in left upper extremity See above medical treatment plan. - DG Shoulder Left; Future - Ambulatory referral to Physical Therapy  3. Frozen shoulder syndrome, left See above medical treatment plan. - Ambulatory referral to Physical Therapy  4. Anemia, iron deficiency Stable. Diagnosis pulled for medication refill. Continue current medical treatment plan. - ferrous sulfate (CVS IRON) 325 (65 FE) MG tablet; Take 1 tablet (325 mg total) by mouth 2 (two) times daily.  Dispense: 180 tablet; Refill: 1  Addend: XRay results: CLINICAL DATA: Two month history of left shoulder pain radiating into the left upper extremity. No known injuries.  EXAM: LEFT SHOULDER - 2+ VIEW  COMPARISON: None.  FINDINGS: No evidence of acute or subacute fracture or glenohumeral dislocation. Narrowing of the glenohumeral joint space with a large spur arising from the humeral head. Narrowing of the subacromial space. Acromioclavicular joint intact with mild degenerative changes. Osseous demineralization.  IMPRESSION: Osteoarthritis involving the glenohumeral joint. Narrowed subacromial space which may indicate chronic supraspinatus tendon disease. Mild degenerative changes involving the AC joint.   Electronically Signed  By: Evangeline Dakin M.D.  On: 12/22/2015 07:43  5. Osteoarthritis of left shoulder, unspecified osteoarthritis type Will continue current treatment plan for PT. Patient already has meloxicam she is taking. I will see her back in 2-4 weeks to see how she is progressing with PT. If no improvement I will refer her to orthopedics.       Mar Daring, PA-C  [facility]

## 2015-12-21 NOTE — Patient Instructions (Signed)
Adhesive Capsulitis Adhesive capsulitis is inflammation of the tendons and ligaments that surround the shoulder joint (shoulder capsule). This condition causes the shoulder to become stiff and painful to move. Adhesive capsulitis is also called frozen shoulder. CAUSES This condition may be caused by:  An injury to the shoulder joint.  Straining the shoulder.  Not moving the shoulder for a period of time. This can happen if your arm was injured or in a sling.  Long-standing health problems, such as:  Diabetes.  Thyroid problems.  Heart disease.  Stroke.  Rheumatoid arthritis.  Lung disease. In some cases, the cause may not be known. RISK FACTORS This condition is more likely to develop in:  Women.  People who are older than 80 years of age. SYMPTOMS Symptoms of this condition include:  Pain in the shoulder when moving the arm. There may also be pain when parts of the shoulder are touched. The pain is worse at night or when at rest.  Soreness or aching in the shoulder.  Inability to move the shoulder normally.  Muscle spasms. DIAGNOSIS This condition is diagnosed with a physical exam and imaging tests, such as an X-ray or MRI. TREATMENT This condition may be treated with:  Treatment of the underlying cause or condition.  Physical therapy. This involves performing exercises to get the shoulder moving again.  Medicine. Medicine may be given to relieve pain, inflammation, or muscle spasms.  Steroid injections into the shoulder joint.  Shoulder manipulation. This is a procedure to move the shoulder into another position. It is done after you are given a medicine to make you fall asleep (general anesthetic). The joint may also be injected with salt water at high pressure to break down scarring.  Surgery. This may be done in severe cases when other treatments have failed. Although most people recover completely from adhesive capsulitis, some may not regain the full  movement of the shoulder. HOME CARE INSTRUCTIONS  Take over-the-counter and prescription medicines only as told by your health care provider.  If you are being treated with physical therapy, follow instructions from your physical therapist.  Avoid exercises that put a lot of demand on your shoulder, such as throwing. These exercises can make pain worse.  If directed, apply ice to the injured area:  Put ice in a plastic bag.  Place a towel between your skin and the bag.  Leave the ice on for 20 minutes, 2-3 times per day. SEEK MEDICAL CARE IF:  You develop new symptoms.  Your symptoms get worse.   This information is not intended to replace advice given to you by your health care provider. Make sure you discuss any questions you have with your health care provider.   Document Released: 03/17/2009 Document Revised: 02/08/2015 Document Reviewed: 09/12/2014 Elsevier Interactive Patient Education [employer].

## 2015-12-22 ENCOUNTER — Telehealth: Payer: Self-pay

## 2015-12-22 DIAGNOSIS — M19012 Primary osteoarthritis, left shoulder: Secondary | ICD-10-CM | POA: Insufficient documentation

## 2015-12-22 NOTE — Telephone Encounter (Signed)
-----   Message from Mar Daring, PA-C sent at 12/22/2015  8:49 AM EDT ----- Arthritis noted of the left shoulder. Some chronic disease of one of the tendons of the rotator cuff. Will attempt PT. If no improvement will involve orthopedics.

## 2015-12-22 NOTE — Telephone Encounter (Signed)
Patient advised as directed below.  Thanks,  -Graceyn Fodor 

## 2015-12-26 ENCOUNTER — Other Ambulatory Visit: Payer: Self-pay | Admitting: General Surgery

## 2015-12-26 DIAGNOSIS — Z853 Personal history of malignant neoplasm of breast: Secondary | ICD-10-CM

## 2015-12-26 DIAGNOSIS — C50212 Malignant neoplasm of upper-inner quadrant of left female breast: Secondary | ICD-10-CM

## 2015-12-26 DIAGNOSIS — Z1231 Encounter for screening mammogram for malignant neoplasm of breast: Secondary | ICD-10-CM

## 2015-12-27 ENCOUNTER — Ambulatory Visit: Payer: Medicare Other | Attending: Physician Assistant

## 2015-12-27 DIAGNOSIS — M25612 Stiffness of left shoulder, not elsewhere classified: Secondary | ICD-10-CM

## 2015-12-27 DIAGNOSIS — M6281 Muscle weakness (generalized): Secondary | ICD-10-CM | POA: Insufficient documentation

## 2015-12-27 DIAGNOSIS — M25512 Pain in left shoulder: Secondary | ICD-10-CM | POA: Insufficient documentation

## 2015-12-27 NOTE — Therapy (Signed)
[facility] [address] [place], [address] Phone: [phone]   Fax:  [phone]  Physical Therapy Evaluation  Patient Details  Name: Robin Ashley MRN: [id number] Date of Birth: May 24, 1932 Referring Provider: [name], PA-C  Encounter Date: 12/27/2015      PT End of Session - 12/27/15 0846    Visit Number 1   Number of Visits 13   Date for PT Re-Evaluation 02/08/16   Authorization Type 1   Authorization Time Period of 10   PT Start Time 0846   PT Stop Time 0956   PT Time Calculation (min) 70 min   Activity Tolerance Patient tolerated treatment well   Behavior During Therapy [facility] for tasks assessed/performed      Past Medical History:  Diagnosis Date  . 174.06 March 2013   Right breast, T1c, N0, 12 mm; ER PR positive, HER-2/neu not over expressed. Not a candidate for adjuvant chemotherapy per [facility] tumor board.  . Diabetes mellitus without complication ([place])   . Foot drop, right October 2014   noted post mastectomy, conservative treatment was instituted with resolution, mild edema.  . Hyperlipidemia   . Hypertension     Past Surgical History:  Procedure Laterality Date  . BREAST BIOPSY Left 02/25/13   positive  . BREAST SURGERY Left 03-15-13   left mastectomywith SN biopsy  . COLON SURGERY  1991   Likely segmental resection for diverticulitis based on patient description.  . COLONOSCOPY    . EYE SURGERY Bilateral 04/2012  . intestinal balloon removed  1991   Likely surgery for diverticulitis.  Marland Kitchen MASTECTOMY Left    positive  . 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    There were no vitals filed for this visit.       Subjective Assessment - 12/27/15 0853    Subjective L shoulder pain. 0/10 at rest, 2/10 when starting to move her L arm. 7/10 L shoulder pain at most for the past month.   Pt adds having carpal tunnel  syndrome in her R hand   Pertinent History L shoulder pain. 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 All of a sudden, she got pain in her L shoulder about a couple of months ago which is getting a little worse.  Had an x-ray for her L shoulder which revealed arthritis.  Pt is R hand dominant.   Pt states that she gets regular checkups for her breast CA (negative)   Patient Stated Goals "No more pain."   Currently in Pain? Yes   Pain Score 2   2/10 when raising her L arm   Pain Location Shoulder   Pain Orientation Left   Pain Descriptors / Indicators Numbness;Tingling;Aching  Tingling and numbness at ther hand   Pain Type Acute pain   Pain Onset More than a month ago   Pain Frequency Occasional   Aggravating Factors  Doing crochet and knitting work for a while, turning onto her L side in bed at night (wakes her up sometimes), raising her L arm, reaching behind her back.    Pain Relieving Factors Letting her L arm down, rest, not doing crochet   Multiple Pain Sites No            [facility] PT Assessment - 12/27/15 0833      Assessment   Medical Diagnosis L shoulder pain, frozen shoulder, numbness in tingling in  L UE   Referring Provider [name], PA-C   Onset Date/Surgical Date 10/02/15   Pain began 2 months ago   Prior Therapy No known physical therapy for current condition.      Precautions   Precaution Comments Hx of breast CA     Restrictions   Other Position/Activity Restrictions No known restrictions     Balance Screen   Has the patient fallen in the past 6 months No   Has the patient had a decrease in activity level because of a fear of falling?  No   Is the patient reluctant to leave their home because of a fear of falling?  No     Home Environment   Additional Comments Pt lives in an apartment, no stairs     Prior Function   Vocation Retired   [employer] PLOF: better able to raise her L arm, and crochet and knit with less pain      Observation/Other Assessments   Observations (+) empty can test. Increased L shoulder pain with flexion with scapular retraction.    Quick DASH  34%     Posture/Postural Control   Posture Comments Bilaterally protracted shoulders and neck, L shoulder slightly lower, L iliac crest and greater trochanter slightly higher     AROM   Right Shoulder Flexion 137 Degrees   Right Shoulder ABduction 109 Degrees   Left Shoulder Flexion 70 Degrees  AAROM 82 degrees with pain and stiffness. Pain eases gradual   Left Shoulder ABduction 55 Degrees  71 degrees AAROM with pain and stiffness. Pain eases gradual   Left Shoulder Internal Rotation 68 Degrees  73 AAROM with pain   Left Shoulder External Rotation 58 Degrees  64 AAROM with pain   Cervical Flexion WFL   Cervical Extension WFL   Cervical - Right Side Bend WFL   Cervical - Left Side Bend WFL   Cervical - Right Rotation WFL   Cervical - Left Rotation [facility]     Strength   Right Shoulder Flexion 4+/5   Right Shoulder ABduction 4+/5   Right Shoulder Internal Rotation 4+/5   Right Shoulder External Rotation 4/5   Left Shoulder Flexion 2+/5  with pain; 4/5 at available range   Left Shoulder ABduction 2+/5  with pain; 4/5 at available range.    Left Shoulder Internal Rotation 4-/5  with slight twinge pain   Left Shoulder External Rotation 4-/5  with slight twinge pain   Right Elbow Flexion 4+/5   Right Elbow Extension 5/5   Left Elbow Flexion 4/5   Left Elbow Extension 4/5     Palpation   Palpation comment Decreased posterior, inferior, posterior inferior L glenohumeral joint capsule mobility      Objectives  There-ex  Directed patient with seated manually resisted L shoulder ER isometrics 2x5 with 5 seconds  Standing L shoulder ER isometrics at door frame 2x5 with 5 second holds.   Reviewed and given as part of her HEP. Pt demonstrated and verbalized understanding.   Mod verbal, visual, tactile cues for pain-free level of  effort and gentle muscle contraction and gentle release of muscle contraction, exercise technique, movement at target joints, use of target muscles                      PT Education - 12/27/15 2003    Education provided Yes   Education Details ther-ex, HEP, plan of care   Person(s) Educated Patient   Methods Explanation;Demonstration;Tactile cues;Verbal  cues;Handout   Comprehension Returned demonstration;Verbalized understanding             PT Long Term Goals - 12/27/15 1011      PT LONG TERM GOAL #1   Title Patient will have a decrease in L shoulder pain to 4/10 or less at worst to promote ability to raise her L arm, reach, and perform activities such as crocheting.    Baseline 7/10 L shoulder pain at worst.   Time 6   Period Weeks   Status New     PT LONG TERM GOAL #2   Title Patient will improve L shoulder ER strength by at least 1/2 MMT grade to promote ability to raise her L arm with less pain.    Baseline 4-/5 L shoulder ER   Time 6   Period Weeks   Status New     PT LONG TERM GOAL #3   Title Patient will improve her Quick Dash Disability/Symptom score by at least 10 % as a demonstration of improved function.    Baseline 34%   Time 6   Period Weeks   Status New     PT LONG TERM GOAL #4   Title Patient will improve L shoulder flexion AROM to at least 100 degrees to promote ability to raise her arm.    Baseline L shoulder flexion AROM 70 degrees with less pain.   Time 6   Period Weeks   Status New               Plan - 12/27/15 1005    Clinical Impression Statement Patient is an 82 year old female who came to physical therapy secondary to L shoulder pain. She also presents with poor posture, limited L shoulder A/AROM, weakness, decreased joint mobility, and difficulty performing functional tasks such as reaching, raising her L arm, and performing activities such as crocheting.  Patient will benefit from skilled physical therapy services to  address the aforementioned deficits.    Rehab Potential Fair   Clinical Impairments Affecting Rehab Potential (-): age, multiple comorbidities, pain, weakness. (+): motivation   PT Frequency 2x / week   PT Duration 6 weeks   PT Treatment/Interventions Therapeutic exercise;Manual techniques;Therapeutic activities;Neuromuscular re-education;Patient/family education;Aquatic Therapy   PT Next Visit Plan ROM, manual therapy, gentle strengthening, posture   Consulted and Agree with Plan of Care Patient      Patient will benefit from skilled therapeutic intervention in order to improve the following deficits and impairments:  Pain, Hypomobility, Decreased strength, Postural dysfunction, Decreased range of motion, Improper body mechanics  Visit Diagnosis: Pain in left shoulder - Plan: PT plan of care cert/re-cert  Muscle weakness (generalized) - Plan: PT plan of care cert/re-cert  Stiffness of left shoulder, not elsewhere classified - Plan: PT plan of care cert/re-cert      G-Codes - 68/03/21 1020    Functional Assessment Tool Used QuickDash Disability Symptom Score, clinical presentation, patient interview   Functional Limitation Carrying, moving and handling objects   Changing and Maintaining Body Position Current Status (Y2482) --   Changing and Maintaining Body Position Goal Status (N0037) --   Carrying, Moving and Handling Objects Current Status (C4888) At least 20 percent but less than 40 percent impaired, limited or restricted   Carrying, Moving and Handling Objects Goal Status (B1694) At least 1 percent but less than 20 percent impaired, limited or restricted       Problem List Patient Active Problem List   Diagnosis Date Noted  .  Osteoarthritis of left shoulder 12/22/2015  . Paresthesia and pain of right extremity 06/19/2015  . Diverticulosis 02/17/2015  . Adaptation reaction 02/16/2015  . At risk for falling 02/16/2015  . Benign hypertension with CKD (chronic kidney  disease) stage III 02/16/2015  . Adult BMI 30+ 02/16/2015  . Chronic kidney disease (CKD), stage III (moderate) 02/16/2015  . CN (constipation) 02/16/2015  . Diabetes ([place]) 02/16/2015  . Accumulation of fluid in tissues 02/16/2015  . H/O gastric ulcer 02/16/2015  . Hypercholesteremia 02/16/2015  . Insomnia 02/16/2015  . Gonalgia 02/16/2015  . Cramps of lower extremity 02/16/2015  . Fungal infection of toenail 02/16/2015  . Pain in shoulder 02/16/2015  . Arthritis 11/22/2014  . Helicobacter pylori gastrointestinal tract infection 05/18/2014  . Anemia, iron deficiency 03/27/2014  . Bilateral lower extremity edema 02/22/2014  . Breast cancer ([place]) 03/04/2013    Thank you for your referral.   Joneen Boers PT, DPT     12/27/2015, 8:14 PM  [facility] [address], [place], [address] Phone: [phone]   Fax:  [phone]  Name: Robin Ashley MRN: [id number] Date of Birth: 02-10-1932

## 2015-12-27 NOTE — Patient Instructions (Signed)
   External Rotation (Isometric)    Place back of left fist against door frame, with elbow bent. Press fist against door frame (pain-free level of effort). Hold ___5_ seconds. Repeat __5__ times. Do ___3_ sessions per day.

## 2016-01-01 ENCOUNTER — Ambulatory Visit: Payer: Medicare Other

## 2016-01-01 DIAGNOSIS — M25612 Stiffness of left shoulder, not elsewhere classified: Secondary | ICD-10-CM

## 2016-01-01 DIAGNOSIS — M6281 Muscle weakness (generalized): Secondary | ICD-10-CM | POA: Diagnosis not present

## 2016-01-01 DIAGNOSIS — M25512 Pain in left shoulder: Secondary | ICD-10-CM | POA: Diagnosis not present

## 2016-01-01 NOTE — Patient Instructions (Signed)
    Scapular Retraction    Please perform in sitting:   Give yourself a "double chin." With arms at sides, pinch shoulder blades together. Hold for 5 seconds. Repeat __10__ times per set. Do __3__ sets per session.     Copyright  VHI. All rights reserved.

## 2016-01-01 NOTE — Therapy (Signed)
[facility] [address] [place], [address] Phone: [phone]   Fax:  [phone]  Physical Therapy Treatment  Patient Details  Name: Robin Ashley MRN: [id number] Date of Birth: 04-20-1932 Referring Provider: [name], PA-C  Encounter Date: 01/01/2016      PT End of Session - 01/01/16 1348    Visit Number 2   Number of Visits 13   Date for PT Re-Evaluation 02/08/16   Authorization Type 2   Authorization Time Period of 10   PT Start Time 1348   PT Stop Time 1431   PT Time Calculation (min) 43 min   Activity Tolerance Patient tolerated treatment well   Behavior During Therapy [facility] for tasks assessed/performed      Past Medical History:  Diagnosis Date  . 174.06 March 2013   Right breast, T1c, N0, 12 mm; ER PR positive, HER-2/neu not over expressed. Not a candidate for adjuvant chemotherapy per [facility] tumor board.  . Diabetes mellitus without complication ([place])   . Foot drop, right October 2014   noted post mastectomy, conservative treatment was instituted with resolution, mild edema.  . Hyperlipidemia   . Hypertension     Past Surgical History:  Procedure Laterality Date  . BREAST BIOPSY Left 02/25/13   positive  . BREAST SURGERY Left 03-15-13   left mastectomywith SN biopsy  . COLON SURGERY  1991   Likely segmental resection for diverticulitis based on patient description.  . COLONOSCOPY    . EYE SURGERY Bilateral 04/2012  . intestinal balloon removed  1991   Likely surgery for diverticulitis.  Marland Kitchen MASTECTOMY Left    positive  . 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    There were no vitals filed for this visit.      Subjective Assessment - 01/01/16 1349    Subjective L shoulder is feeling pretty good. No pain at rest. 4-5/10 L shouder pain when raising her L arm. Pt states that she has an oncologist appointment at the  end of the month.     Pertinent History L shoulder pain. 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 All of a sudden, she got pain in her L shoulder about a couple of months ago which is getting a little worse.  Had an x-ray for her L shoulder which revealed arthritis.  Pt is R hand dominant.    Patient Stated Goals "No more pain."   Currently in Pain? Yes   Pain Onset More than a month ago         Objectives  There-ex   Supine L shoulder abduction PROM to about 90 degrees 10x2 with PT  Supine sustained gentle posterior pressure, then sustained gentle posterior inferior pressure to L shoulder in about 70 degrees abduction to promote shoulder joint mobility. Discomfort which eases when pressure is removed.   supine L shoulder scaption AAROM 10x2 with PT  Supine L shoulder serratus reach with L shoulder in 90 degrees flexion 10x3  Supine L shoulder ER AAROM using UE ranger 10x at about 50 degrees abduction    seated L shoulder AAROM flexion using UE ranger on floor 10x3  Seated L shoulder AAROM scaption using UE ranger on floor 5x3 (weakness observed)   Seated bilateral scapular retraction 10x 5 seconds for 2 sets    Pt states L shoulder feels a little better after session.   Improved exercise technique,  movement at target joints, use of target muscles after mod verbal, visual, tactile cues.     L shoulder discomfort with end range ROM but eases with rest. Slight decrease in L shoulder pain after session.         PT Education - 01/01/16 1412    Education provided Yes   Education Details ther-ex; HEP   Person(s) Educated Patient   Methods Demonstration;Explanation;Tactile cues;Verbal cues;Handout   Comprehension Verbalized understanding;Returned demonstration             PT Long Term Goals - 12/27/15 1011      PT LONG TERM GOAL #1   Title Patient will have a decrease in L shoulder pain to 4/10 or less at worst to promote ability to raise her L arm, reach,  and perform activities such as crocheting.    Baseline 7/10 L shoulder pain at worst.   Time 6   Period Weeks   Status New     PT LONG TERM GOAL #2   Title Patient will improve L shoulder ER strength by at least 1/2 MMT grade to promote ability to raise her L arm with less pain.    Baseline 4-/5 L shoulder ER   Time 6   Period Weeks   Status New     PT LONG TERM GOAL #3   Title Patient will improve her Quick Dash Disability/Symptom score by at least 10 % as a demonstration of improved function.    Baseline 34%   Time 6   Period Weeks   Status New     PT LONG TERM GOAL #4   Title Patient will improve L shoulder flexion AROM to at least 100 degrees to promote ability to raise her arm.    Baseline L shoulder flexion AROM 70 degrees with less pain.   Time 6   Period Weeks   Status New               Plan - 01/01/16 1422    Clinical Impression Statement L shoulder discomfort with end range ROM but eases with rest. Slight decrease in L shoulder pain after session.    Rehab Potential Fair   Clinical Impairments Affecting Rehab Potential (-): age, multiple comorbidities, pain, weakness. (+): motivation   PT Frequency 2x / week   PT Duration 6 weeks   PT Treatment/Interventions Therapeutic exercise;Manual techniques;Therapeutic activities;Neuromuscular re-education;Patient/family education;Aquatic Therapy   PT Next Visit Plan ROM, manual therapy, gentle strengthening, posture   Consulted and Agree with Plan of Care Patient      Patient will benefit from skilled therapeutic intervention in order to improve the following deficits and impairments:  Pain, Hypomobility, Decreased strength, Postural dysfunction, Decreased range of motion, Improper body mechanics  Visit Diagnosis: Pain in left shoulder  Muscle weakness (generalized)  Stiffness of left shoulder, not elsewhere classified     Problem List Patient Active Problem List   Diagnosis Date Noted  . Osteoarthritis  of left shoulder 12/22/2015  . Paresthesia and pain of right extremity 06/19/2015  . Diverticulosis 02/17/2015  . Adaptation reaction 02/16/2015  . At risk for falling 02/16/2015  . Benign hypertension with CKD (chronic kidney disease) stage III 02/16/2015  . Adult BMI 30+ 02/16/2015  . Chronic kidney disease (CKD), stage III (moderate) 02/16/2015  . CN (constipation) 02/16/2015  . Diabetes ([place]) 02/16/2015  . Accumulation of fluid in tissues 02/16/2015  . H/O gastric ulcer 02/16/2015  . Hypercholesteremia 02/16/2015  . Insomnia 02/16/2015  . Gonalgia 02/16/2015  .  Cramps of lower extremity 02/16/2015  . Fungal infection of toenail 02/16/2015  . Pain in shoulder 02/16/2015  . Arthritis 11/22/2014  . Helicobacter pylori gastrointestinal tract infection 05/18/2014  . Anemia, iron deficiency 03/27/2014  . Bilateral lower extremity edema 02/22/2014  . Breast cancer ([place]) 03/04/2013    Joneen Boers PT, DPT   01/01/2016, 9:39 PM  [facility] [address], [place], [address] Phone: [phone]   Fax:  [phone]  Name: Robin Ashley MRN: [id number] Date of Birth: 24-Apr-1932

## 2016-01-04 ENCOUNTER — Ambulatory Visit: Payer: Medicare Other | Attending: Physician Assistant

## 2016-01-04 DIAGNOSIS — M25512 Pain in left shoulder: Secondary | ICD-10-CM | POA: Diagnosis not present

## 2016-01-04 DIAGNOSIS — M25612 Stiffness of left shoulder, not elsewhere classified: Secondary | ICD-10-CM

## 2016-01-04 DIAGNOSIS — M6281 Muscle weakness (generalized): Secondary | ICD-10-CM

## 2016-01-04 NOTE — Therapy (Signed)
[place] [facility] [address] [place], [address] Phone: [phone]   Fax:  [phone]  Physical Therapy Treatment  Patient Details  Name: Robin Ashley MRN: [id number] Date of Birth: [birth year] Referring Provider: [name], PA-C  Encounter Date: 01/04/2016      PT End of Session - 01/04/16 1349    Visit Number 3   Number of Visits 13   Date for PT Re-Evaluation 02/08/16   Authorization Type 3   Authorization Time Period of 10   PT Start Time 1349   PT Stop Time 1441   PT Time Calculation (min) 52 min   Activity Tolerance Patient tolerated treatment well   Behavior During Therapy [facility] for tasks assessed/performed      Past Medical History:  Diagnosis Date  . 174.06 March 2013   Right breast, T1c, N0, 12 mm; ER PR positive, HER-2/neu not over expressed. Not a candidate for adjuvant chemotherapy per [facility] tumor board.  . Diabetes mellitus without complication ([place])   . Foot drop, right October 2014   noted post mastectomy, conservative treatment was instituted with resolution, mild edema.  . Hyperlipidemia   . Hypertension     Past Surgical History:  Procedure Laterality Date  . BREAST BIOPSY Left 02/25/13   positive  . BREAST SURGERY Left 03-15-13   left mastectomywith SN biopsy  . COLON SURGERY  1991   Likely segmental resection for diverticulitis based on patient description.  . COLONOSCOPY    . EYE SURGERY Bilateral 04/2012  . intestinal balloon removed  1991   Likely surgery for diverticulitis.  Marland Kitchen MASTECTOMY Left    positive  . 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    There were no vitals filed for this visit.      Subjective Assessment - 01/04/16 1351    Subjective L shoulder is better but every once in a while when she turns the wrong way, she feels it. Got a good night's sleep yesterday. 2-3/10 when moving her L arm.     Pertinent History L shoulder pain. 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 All of a sudden, she got pain in her L shoulder about a couple of months ago which is getting a little worse.  Had an x-ray for her L shoulder which revealed arthritis.  Pt is R hand dominant.    Patient Stated Goals "No more pain."   Currently in Pain? Yes   Pain Score 3   2-3/10 when moving her L arm   Pain Orientation Left   Pain Onset More than a month ago     Pt adds that according to the x-ray, there is arthritis in her L shoulder.             Objectives  There-ex   Directed patient with seated L shoulder AAROM flexion using UE ranger on floor 10x3  Seated L shoulder AAROM scaption using UE ranger on floor 5x3   Seated L scapular protraction 10x3 with 5 second holds with L shoulder in about 90 degrees flexion using the UE ranger  Seated L shoulder ER 8x (L lateral arm and forearm discomfort around C5/C6 dermatome)  Then 10x2 (no symptoms after manual therapy to R rhomboid muscle area)  Standing L shoulder extension resisting yellow band 10x2.   Reviewed and given as part of her HEP. Pt demonstrated and verbalized understanding.   Improved  exercise technique, movement at target joints, use of target muscles after mod verbal, visual, tactile cues.     Manual therapy  STM R rhomboid   Decreased L rotation around C7 area. Decreased L lateral UE discomfort with L shoulder ER.     Decreased L UE discomfort around the C6/C5 dermatome area with L shoulder ER after manual therapy to the R rhomboid muscle to promote more neutral lower cervical/upper thoracic spine. Slight discomfort with end range L shoulder flexion and scaption AAROM which eases fairly quickly with rest suggesting possible stiffness.                PT Education - 01/04/16 1359    Education provided Yes   Education Details ther-ex, HEP   Person(s) Educated Patient   Methods Explanation;Demonstration;Tactile  cues;Verbal cues;Handout   Comprehension Returned demonstration;Verbalized understanding             PT Long Term Goals - 12/27/15 1011      PT LONG TERM GOAL #1   Title Patient will have a decrease in L shoulder pain to 4/10 or less at worst to promote ability to raise her L arm, reach, and perform activities such as crocheting.    Baseline 7/10 L shoulder pain at worst.   Time 6   Period Weeks   Status New     PT LONG TERM GOAL #2   Title Patient will improve L shoulder ER strength by at least 1/2 MMT grade to promote ability to raise her L arm with less pain.    Baseline 4-/5 L shoulder ER   Time 6   Period Weeks   Status New     PT LONG TERM GOAL #3   Title Patient will improve her Quick Dash Disability/Symptom score by at least 10 % as a demonstration of improved function.    Baseline 34%   Time 6   Period Weeks   Status New     PT LONG TERM GOAL #4   Title Patient will improve L shoulder flexion AROM to at least 100 degrees to promote ability to raise her arm.    Baseline L shoulder flexion AROM 70 degrees with less pain.   Time 6   Period Weeks   Status New               Plan - 01/04/16 1347    Clinical Impression Statement Decreased L UE discomfort around the C6/C5 dermatome area with L shoulder ER after manual therapy to the R rhomboid muscle to promote more neutral lower cervical/upper thoracic spine. Slight discomfort with end range L shoulder flexion and scaption AAROM which eases fairly quickly with rest suggesting possible stiffness.    Rehab Potential Fair   Clinical Impairments Affecting Rehab Potential (-): age, multiple comorbidities, pain, weakness. (+): motivation   PT Frequency 2x / week   PT Duration 6 weeks   PT Treatment/Interventions Therapeutic exercise;Manual techniques;Therapeutic activities;Neuromuscular re-education;Patient/family education;Aquatic Therapy   PT Next Visit Plan ROM, manual therapy, gentle strengthening, posture    Consulted and Agree with Plan of Care Patient      Patient will benefit from skilled therapeutic intervention in order to improve the following deficits and impairments:  Pain, Hypomobility, Decreased strength, Postural dysfunction, Decreased range of motion, Improper body mechanics  Visit Diagnosis: Pain in left shoulder  Muscle weakness (generalized)  Stiffness of left shoulder, not elsewhere classified     Problem List Patient Active Problem List   Diagnosis Date Noted  .  Osteoarthritis of left shoulder 12/22/2015  . Paresthesia and pain of right extremity 06/19/2015  . Diverticulosis 02/17/2015  . Adaptation reaction 02/16/2015  . At risk for falling 02/16/2015  . Benign hypertension with CKD (chronic kidney disease) stage III 02/16/2015  . Adult BMI 30+ 02/16/2015  . Chronic kidney disease (CKD), stage III (moderate) 02/16/2015  . CN (constipation) 02/16/2015  . Diabetes ([place]) 02/16/2015  . Accumulation of fluid in tissues 02/16/2015  . H/O gastric ulcer 02/16/2015  . Hypercholesteremia 02/16/2015  . Insomnia 02/16/2015  . Gonalgia 02/16/2015  . Cramps of lower extremity 02/16/2015  . Fungal infection of toenail 02/16/2015  . Pain in shoulder 02/16/2015  . Arthritis 11/22/2014  . Helicobacter pylori gastrointestinal tract infection 05/18/2014  . Anemia, iron deficiency 03/27/2014  . Bilateral lower extremity edema 02/22/2014  . Breast cancer ([place]) 03/04/2013    Joneen Boers PT, DPT   01/04/2016, 3:56 PM  [place] [facility] [address], [place], [address] Phone: [phone]   Fax:  [phone]  Name: Robin Ashley MRN: [id number] Date of Birth: [birth year]

## 2016-01-04 NOTE — Patient Instructions (Signed)
Strengthening: Resisted Extension    Hold yellow band in left hand, arm forward. Pull arm back, elbow straight.  Repeat ___10_ times per set. Do 2 to_3___ sets per session. Do _1__ sessions per day.  http://orth.exer.us/832   Copyright  VHI. All rights reserved.

## 2016-01-08 ENCOUNTER — Ambulatory Visit: Payer: Medicare Other

## 2016-01-08 DIAGNOSIS — M6281 Muscle weakness (generalized): Secondary | ICD-10-CM

## 2016-01-08 DIAGNOSIS — M25512 Pain in left shoulder: Secondary | ICD-10-CM

## 2016-01-08 DIAGNOSIS — M25612 Stiffness of left shoulder, not elsewhere classified: Secondary | ICD-10-CM

## 2016-01-08 NOTE — Patient Instructions (Signed)
  Gave seated bilateral ER 10x3 daily as part of her HEP. Pt demonstrated and verbalized understanding.

## 2016-01-08 NOTE — Therapy (Signed)
[place] [facility] [address], [place], [address] Phone: [phone]   Fax:  [phone]  Physical Therapy Treatment  Patient Details  Name: Robin Ashley MRN: [id number] Date of Birth: [birth year] Referring Provider: [name], PA-C  Encounter Date: 01/08/2016      PT End of Session - 01/08/16 0809    Visit Number 4   Number of Visits 13   Date for PT Re-Evaluation 02/08/16   Authorization Type 4   Authorization Time Period of 10   PT Start Time 0809   PT Stop Time 0848   PT Time Calculation (min) 39 min   Activity Tolerance Patient tolerated treatment well   Behavior During Therapy [facility] for tasks assessed/performed      Past Medical History:  Diagnosis Date  . 174.06 March 2013   Right breast, T1c, N0, 12 mm; ER PR positive, HER-2/neu not over expressed. Not a candidate for adjuvant chemotherapy per [facility] tumor board.  . Diabetes mellitus without complication ([place])   . Foot drop, right October 2014   noted post mastectomy, conservative treatment was instituted with resolution, mild edema.  . Hyperlipidemia   . Hypertension     Past Surgical History:  Procedure Laterality Date  . BREAST BIOPSY Left 02/25/13   positive  . BREAST SURGERY Left 03-15-13   left mastectomywith SN biopsy  . COLON SURGERY  1991   Likely segmental resection for diverticulitis based on patient description.  . COLONOSCOPY    . EYE SURGERY Bilateral 04/2012  . intestinal balloon removed  1991   Likely surgery for diverticulitis.  Marland Kitchen MASTECTOMY Left    positive  . 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    There were no vitals filed for this visit.      Subjective Assessment - 01/08/16 0811    Subjective L shoulder is better. 3/10 when raising her L arm.   Pertinent History L shoulder pain. 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 All of a sudden, she got  pain in her L shoulder about a couple of months ago which is getting a little worse.  Had an x-ray for her L shoulder which revealed arthritis.  Pt is R hand dominant.    Patient Stated Goals "No more pain."   Currently in Pain? Yes   Pain Score 3   when raising her L arm. 0/10 at rest   Pain Onset More than a month ago                     There-ex   Supine L shoulder abduction AAROM to about 90 degrees 10x2 with PT  Then 10x with gentle inferior pressure to proximal humerus  Supine L shoulder scaption AAROM 10x2  Then 10x with gentle inferior pressure to proximal humerus  Supine gentle L shoulder distraction 5x Supine gentle sustained posterior inferior pressure to L proximal humerus  Supine L scapular protraction with PT assist at 90 degrees flexion 10x5 seconds. L hand tingling which disappears with rest.   Supine L shoulder ER AAROM with UE ranger in scapular plane 10x3  Seated L scapular protraction at around 90 degrees flexion using the UE ranger 10x5 seconds. No L hand tingling.   Seated L shoulder ER AROM 10x2. No R UE symptoms today  Reviewed and given as part of her HEP 10x3 daily. Pt demonstrated and verbalized understanding.  Improved exercise technique, movement at target joints, use of target muscles after mod verbal, visual, tactile cues.            PT Education - 01/08/16 0854    Education provided Yes   Education Details ther-ex, HEP   Person(s) Educated Patient   Methods Explanation;Demonstration;Tactile cues;Verbal cues   Comprehension Verbalized understanding;Returned demonstration             PT Long Term Goals - 12/27/15 1011      PT LONG TERM GOAL #1   Title Patient will have a decrease in L shoulder pain to 4/10 or less at worst to promote ability to raise her L arm, reach, and perform activities such as crocheting.    Baseline 7/10 L shoulder pain at worst.   Time 6   Period Weeks   Status New     PT LONG TERM GOAL  #2   Title Patient will improve L shoulder ER strength by at least 1/2 MMT grade to promote ability to raise her L arm with less pain.    Baseline 4-/5 L shoulder ER   Time 6   Period Weeks   Status New     PT LONG TERM GOAL #3   Title Patient will improve her Quick Dash Disability/Symptom score by at least 10 % as a demonstration of improved function.    Baseline 34%   Time 6   Period Weeks   Status New     PT LONG TERM GOAL #4   Title Patient will improve L shoulder flexion AROM to at least 100 degrees to promote ability to raise her arm.    Baseline L shoulder flexion AROM 70 degrees with less pain.   Time 6   Period Weeks   Status New               Plan - 01/08/16 9163    Clinical Impression Statement No L UE discomfort with L shoulder ER AROM today. Pt tolerated session without aggravation of symptoms. Discomfort at end range all planes (with some stiff end feel) which decreases with rest.    Rehab Potential Fair   Clinical Impairments Affecting Rehab Potential (-): age, multiple comorbidities, pain, weakness. (+): motivation   PT Frequency 2x / week   PT Duration 6 weeks   PT Treatment/Interventions Therapeutic exercise;Manual techniques;Therapeutic activities;Neuromuscular re-education;Patient/family education;Aquatic Therapy   PT Next Visit Plan ROM, manual therapy, gentle strengthening, posture   Consulted and Agree with Plan of Care Patient      Patient will benefit from skilled therapeutic intervention in order to improve the following deficits and impairments:  Pain, Hypomobility, Decreased strength, Postural dysfunction, Decreased range of motion, Improper body mechanics  Visit Diagnosis: Pain in left shoulder  Muscle weakness (generalized)  Stiffness of left shoulder, not elsewhere classified     Problem List Patient Active Problem List   Diagnosis Date Noted  . Osteoarthritis of left shoulder 12/22/2015  . Paresthesia and pain of right  extremity 06/19/2015  . Diverticulosis 02/17/2015  . Adaptation reaction 02/16/2015  . At risk for falling 02/16/2015  . Benign hypertension with CKD (chronic kidney disease) stage III 02/16/2015  . Adult BMI 30+ 02/16/2015  . Chronic kidney disease (CKD), stage III (moderate) 02/16/2015  . CN (constipation) 02/16/2015  . Diabetes ([place]) 02/16/2015  . Accumulation of fluid in tissues 02/16/2015  . H/O gastric ulcer 02/16/2015  . Hypercholesteremia 02/16/2015  . Insomnia 02/16/2015  . Gonalgia 02/16/2015  . Cramps of  lower extremity 02/16/2015  . Fungal infection of toenail 02/16/2015  . Pain in shoulder 02/16/2015  . Arthritis 11/22/2014  . Helicobacter pylori gastrointestinal tract infection 05/18/2014  . Anemia, iron deficiency 03/27/2014  . Bilateral lower extremity edema 02/22/2014  . Breast cancer [facility]) 03/04/2013    Joneen Boers PT, DPT   01/08/2016, 11:07 AM  [place] [facility] [address], [place], [address] Phone: [phone]   Fax:  [phone]  Name: Robin Ashley MRN: [id number] Date of Birth: Oct 29, 1931

## 2016-01-09 ENCOUNTER — Encounter: Payer: Self-pay | Admitting: *Deleted

## 2016-01-10 ENCOUNTER — Ambulatory Visit: Payer: Medicare Other

## 2016-01-10 DIAGNOSIS — M25612 Stiffness of left shoulder, not elsewhere classified: Secondary | ICD-10-CM | POA: Diagnosis not present

## 2016-01-10 DIAGNOSIS — M6281 Muscle weakness (generalized): Secondary | ICD-10-CM | POA: Diagnosis not present

## 2016-01-10 DIAGNOSIS — M25512 Pain in left shoulder: Secondary | ICD-10-CM

## 2016-01-10 NOTE — Therapy (Signed)
[place] [facility] [address] [place], [address] Phone: [phone]   Fax:  [phone]  Physical Therapy Treatment  Patient Details  Name: Robin Ashley MRN: [id number] Date of Birth: November 21, 1931 Referring Provider: [name], PA-C  Encounter Date: 01/10/2016      PT End of Session - 01/10/16 0858    Visit Number 5   Number of Visits 13   Date for PT Re-Evaluation 02/08/16   Authorization Type 5   Authorization Time Period of 10   PT Start Time 0858   PT Stop Time 0947   PT Time Calculation (min) 49 min   Activity Tolerance Patient tolerated treatment well   Behavior During Therapy [facility] for tasks assessed/performed      Past Medical History:  Diagnosis Date  . 174.06 March 2013   Right breast, T1c, N0, 12 mm; ER PR positive, HER-2/neu not over expressed. Not a candidate for adjuvant chemotherapy per [facility] tumor board.  . Diabetes mellitus without complication ([place])   . Foot drop, right October 2014   noted post mastectomy, conservative treatment was instituted with resolution, mild edema.  . Hyperlipidemia   . Hypertension     Past Surgical History:  Procedure Laterality Date  . BREAST BIOPSY Left 02/25/13   positive  . BREAST SURGERY Left 03-15-13   left mastectomywith SN biopsy  . COLON SURGERY  1991   Likely segmental resection for diverticulitis based on patient description.  . COLONOSCOPY    . EYE SURGERY Bilateral 04/2012  . intestinal balloon removed  1991   Likely surgery for diverticulitis.  Marland Kitchen MASTECTOMY Left    positive  . 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    There were no vitals filed for this visit.      Subjective Assessment - 01/10/16 0858    Subjective L shoulder is feeling pretty good. 3-4/10 when raising her L arm. Reaching behind her back is "so-so."   Pertinent History L shoulder pain. Pt states she  does a lot of knitting work. All of a sudden, she got pain in her L shoulder about a couple of months ago which is getting a little worse.  Had an x-ray for her L shoulder which revealed arthritis.  Pt is R hand dominant.    Patient Stated Goals "No more pain."   Currently in Pain? Yes   Pain Score 4   3-4/10 when raising her L arm.    Pain Onset More than a month ago          Objectives  Manual therapy:   L arm propped on table at around 70 degrees scaption: gentle sustained posterior, inferior, posterior/inferior pressure to humeral head at her L shoulder joint.   Slight improved joint mobility palpated afterwards.    There-ex  Sitting with L arm propped in about 70 degrees scaption: ER 10x3  Standing towel slides up the wall for scaption 10x3  L hand push on small physioball at wall 5x.   Yellow T-band resisted L shoulder ER pain free range 10x2  Yellow T-band resisted L shoulder IR 10x2  Standing L shoulder flexion isometrics (neutral position) 2x5 with 5 second holds.  Seated bilateral scapular retraction 10x2 with 5 second holds  Seated L scapular protraction using UE Ranger, with L arm in about 90 degrees flexion 10x2 with 5 second holds.    Improved exercise technique, movement at target  joints, use of target muscles after mod verbal, visual, tactile cues.     Fair tolerance to today's session. Crepitus palpated during exercises. Per pt, TTP superior lateral L shoulder (inferior lateral to acromion). Worked on ROM, scapular and rotator cuff strengthening to promote proper movement at the glenohumeral joint.                  PT Education - 01/10/16 [phone]    Education provided Yes   Education Details ther-ex, HEP   Person(s) Educated Patient   Methods Demonstration;Explanation;Tactile cues;Verbal cues   Comprehension Verbalized understanding;Returned demonstration             PT Long Term Goals - 12/27/15 1011      PT LONG TERM GOAL #1    Title Patient will have a decrease in L shoulder pain to 4/10 or less at worst to promote ability to raise her L arm, reach, and perform activities such as crocheting.    Baseline 7/10 L shoulder pain at worst.   Time 6   Period Weeks   Status New     PT LONG TERM GOAL #2   Title Patient will improve L shoulder ER strength by at least 1/2 MMT grade to promote ability to raise her L arm with less pain.    Baseline 4-/5 L shoulder ER   Time 6   Period Weeks   Status New     PT LONG TERM GOAL #3   Title Patient will improve her Quick Dash Disability/Symptom score by at least 10 % as a demonstration of improved function.    Baseline 34%   Time 6   Period Weeks   Status New     PT LONG TERM GOAL #4   Title Patient will improve L shoulder flexion AROM to at least 100 degrees to promote ability to raise her arm.    Baseline L shoulder flexion AROM 70 degrees with less pain.   Time 6   Period Weeks   Status New               Plan - 01/10/16 0956    Clinical Impression Statement Fair tolerance to today's session. Crepitus palpated during exercises. Per pt, TTP superior lateral L shoulder (inferior lateral to acromion). Worked on ROM, scapular and rotator cuff strengthening to promote proper movement at the glenohumeral joint.    Rehab Potential Fair   Clinical Impairments Affecting Rehab Potential (-): age, multiple comorbidities, pain, weakness. (+): motivation   PT Frequency 2x / week   PT Duration 6 weeks   PT Treatment/Interventions Therapeutic exercise;Manual techniques;Therapeutic activities;Neuromuscular re-education;Patient/family education;Aquatic Therapy   PT Next Visit Plan ROM, manual therapy, gentle strengthening, posture   Consulted and Agree with Plan of Care Patient      Patient will benefit from skilled therapeutic intervention in order to improve the following deficits and impairments:  Pain, Hypomobility, Decreased strength, Postural dysfunction, Decreased  range of motion, Improper body mechanics  Visit Diagnosis: Pain in left shoulder  Muscle weakness (generalized)  Stiffness of left shoulder, not elsewhere classified     Problem List Patient Active Problem List   Diagnosis Date Noted  . Osteoarthritis of left shoulder 12/22/2015  . Paresthesia and pain of right extremity 06/19/2015  . Diverticulosis 02/17/2015  . Adaptation reaction 02/16/2015  . At risk for falling 02/16/2015  . Benign hypertension with CKD (chronic kidney disease) stage III 02/16/2015  . Adult BMI 30+ 02/16/2015  . Chronic kidney disease (CKD),  stage III (moderate) 02/16/2015  . CN (constipation) 02/16/2015  . Diabetes ([place]) 02/16/2015  . Accumulation of fluid in tissues 02/16/2015  . H/O gastric ulcer 02/16/2015  . Hypercholesteremia 02/16/2015  . Insomnia 02/16/2015  . Gonalgia 02/16/2015  . Cramps of lower extremity 02/16/2015  . Fungal infection of toenail 02/16/2015  . Pain in shoulder 02/16/2015  . Arthritis 11/22/2014  . Helicobacter pylori gastrointestinal tract infection 05/18/2014  . Anemia, iron deficiency 03/27/2014  . Bilateral lower extremity edema 02/22/2014  . Breast cancer [facility]) 03/04/2013    Joneen Boers PT, DPT   01/10/2016, 10:04 AM  [place] [facility] [address], [place], [address] Phone: [phone]   Fax:  [phone]  Name: HAMSINI VERRILLI MRN: [id number] Date of Birth: [birth year]

## 2016-01-10 NOTE — Patient Instructions (Addendum)
  Gave towel slides up the wall scaption 10x3 daily for L shoulder as part of her HEP. Pt demonstrated and verbalized understanding.

## 2016-01-17 ENCOUNTER — Ambulatory Visit: Payer: Medicare Other | Admitting: Physical Therapy

## 2016-01-17 DIAGNOSIS — M6281 Muscle weakness (generalized): Secondary | ICD-10-CM | POA: Diagnosis not present

## 2016-01-17 DIAGNOSIS — M25512 Pain in left shoulder: Secondary | ICD-10-CM | POA: Diagnosis not present

## 2016-01-17 DIAGNOSIS — M25612 Stiffness of left shoulder, not elsewhere classified: Secondary | ICD-10-CM

## 2016-01-17 NOTE — Therapy (Signed)
[place] [address] [place], [address] Phone: [phone]   Fax:  [phone]  Physical Therapy Treatment  Patient Details  Name: Robin Ashley MRN: [id number] Date of Birth: 06-24-1931 Referring Provider: [name], PA-C  Encounter Date: 01/17/2016      PT End of Session - 01/17/16 1117    Visit Number 6   Number of Visits 13   Date for PT Re-Evaluation 02/08/16   Authorization Type 5   Authorization Time Period of 10   PT Start Time 1112   PT Stop Time 1158   PT Time Calculation (min) 46 min   Activity Tolerance Patient tolerated treatment well   Behavior During Therapy [facility] for tasks assessed/performed      Past Medical History:  Diagnosis Date  . 174.06 March 2013   Right breast, T1c, N0, 12 mm; ER PR positive, HER-2/neu not over expressed. Not a candidate for adjuvant chemotherapy per [facility] tumor board.  . Diabetes mellitus without complication ([place])   . Foot drop, right October 2014   noted post mastectomy, conservative treatment was instituted with resolution, mild edema.  . Hyperlipidemia   . Hypertension     Past Surgical History:  Procedure Laterality Date  . BREAST BIOPSY Left 02/25/13   positive  . BREAST SURGERY Left 03-15-13   left mastectomywith SN biopsy  . COLON SURGERY  1991   Likely segmental resection for diverticulitis based on patient description.  . COLONOSCOPY    . EYE SURGERY Bilateral 04/2012  . intestinal balloon removed  1991   Likely surgery for diverticulitis.  Marland Kitchen MASTECTOMY Left    positive  . 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    There were no vitals filed for this visit.      Subjective Assessment - 01/17/16 1116    Subjective Pt reports she is doing pretty good. She is having slight pain after performing exercises at home today, but not bad. Some days better than others.   Pertinent History L shoulder pain. 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 All of a sudden, she got pain in her L shoulder about a couple of months ago which is getting a little worse.  Had an x-ray for her L shoulder which revealed arthritis.  Pt is R hand dominant.    Patient Stated Goals "No more pain."   Currently in Pain? Yes   Pain Score 3   L shoulder   Pain Onset More than a month ago         There-ex  UBE L1 x 5 min with towel roll behind back to facilitate proper posture, cues for decreased shoulder shrug, upright posture and gentle scapular retraction  UE Ranger L UE flexion 2x10 Supine AAROM flexion with pipe 2x10 Supine L shoulder abduction AAROM with pipe to tolerance 2x10 Supine L shoulder ER AAROM with pipe to tolerance, towel roll prop, 2x10     Supine L scapular protraction with PT assist at 90 degrees flexion 10x3, 5 seconds. No hand tingling.    Mid rows with YTB 2x10 Low row with YTB 2x10 ER and IR with YTB 2x10  Ball on wall CW, CCW circles 2x10 each  Cues for proper exercise technique to target specific muscles, slow eccentric contractions for most effective strengthening, decreased shoulder shrug, scapular retraction and postural correction for proper GH positioning.  PT Education - 01/17/16 1131    Education provided Yes   Education Details decrease shoulder shrug, scapular retraction, posture correction to improve GH positioning   Person(s) Educated Patient   Methods Explanation;Demonstration   Comprehension Verbalized understanding;Returned demonstration             PT Long Term Goals - 12/27/15 1011      PT LONG TERM GOAL #1   Title Patient will have a decrease in L shoulder pain to 4/10 or less at worst to promote ability to raise her L arm, reach, and perform activities such as crocheting.    Baseline 7/10 L shoulder pain at worst.   Time 6   Period Weeks   Status New     PT LONG TERM  GOAL #2   Title Patient will improve L shoulder ER strength by at least 1/2 MMT grade to promote ability to raise her L arm with less pain.    Baseline 4-/5 L shoulder ER   Time 6   Period Weeks   Status New     PT LONG TERM GOAL #3   Title Patient will improve her Quick Dash Disability/Symptom score by at least 10 % as a demonstration of improved function.    Baseline 34%   Time 6   Period Weeks   Status New     PT LONG TERM GOAL #4   Title Patient will improve L shoulder flexion AROM to at least 100 degrees to promote ability to raise her arm.    Baseline L shoulder flexion AROM 70 degrees with less pain.   Time 6   Period Weeks   Status New               Plan - 01/17/16 1147    Clinical Impression Statement Pt tolerated ROM and increased intensity strengthening for L shoudler well with no increase in pain. Occasional seated rest breaks due to fatigue and knee discomfort. She will benefit from continued UE strengthening and ROM exercises to progress towards functional goals.   Rehab Potential Fair   Clinical Impairments Affecting Rehab Potential (-): age, multiple comorbidities, pain, weakness. (+): motivation   PT Frequency 2x / week   PT Duration 6 weeks   PT Treatment/Interventions Therapeutic exercise;Manual techniques;Therapeutic activities;Neuromuscular re-education;Patient/family education;Aquatic Therapy   PT Next Visit Plan ROM, manual therapy, gentle strengthening, posture   Consulted and Agree with Plan of Care Patient      Patient will benefit from skilled therapeutic intervention in order to improve the following deficits and impairments:  Pain, Hypomobility, Decreased strength, Postural dysfunction, Decreased range of motion, Improper body mechanics  Visit Diagnosis: Pain in left shoulder  Muscle weakness (generalized)  Stiffness of left shoulder, not elsewhere classified     Problem List Patient Active Problem List   Diagnosis Date Noted  .  Osteoarthritis of left shoulder 12/22/2015  . Paresthesia and pain of right extremity 06/19/2015  . Diverticulosis 02/17/2015  . Adaptation reaction 02/16/2015  . At risk for falling 02/16/2015  . Benign hypertension with CKD (chronic kidney disease) stage III 02/16/2015  . Adult BMI 30+ 02/16/2015  . Chronic kidney disease (CKD), stage III (moderate) 02/16/2015  . CN (constipation) 02/16/2015  . Diabetes ([place]) 02/16/2015  . Accumulation of fluid in tissues 02/16/2015  . H/O gastric ulcer 02/16/2015  . Hypercholesteremia 02/16/2015  . Insomnia 02/16/2015  . Gonalgia 02/16/2015  . Cramps of lower extremity 02/16/2015  . Fungal infection of toenail 02/16/2015  . Pain  in shoulder 02/16/2015  . Arthritis 11/22/2014  . Helicobacter pylori gastrointestinal tract infection 05/18/2014  . Anemia, iron deficiency 03/27/2014  . Bilateral lower extremity edema 02/22/2014  . Breast cancer ([place]) 03/04/2013    Keifer Habib Shiela Mayer, PT, DPT  01/17/16, 12:00 PM [place] [facility] [address] [place], [address] Phone: [phone]   Fax:  [phone]  Name: Robin Ashley MRN: [id number] Date of Birth: 26-May-1932

## 2016-01-19 ENCOUNTER — Ambulatory Visit: Payer: Medicare Other | Admitting: Physical Therapy

## 2016-01-19 DIAGNOSIS — M6281 Muscle weakness (generalized): Secondary | ICD-10-CM | POA: Diagnosis not present

## 2016-01-19 DIAGNOSIS — M25512 Pain in left shoulder: Secondary | ICD-10-CM | POA: Diagnosis not present

## 2016-01-19 DIAGNOSIS — M25612 Stiffness of left shoulder, not elsewhere classified: Secondary | ICD-10-CM | POA: Diagnosis not present

## 2016-01-19 NOTE — Therapy (Signed)
[place] [facility] [address] [place], [address] Phone: [phone]   Fax:  [phone]  Physical Therapy Treatment  Patient Details  Name: Robin Ashley MRN: [id number] Date of Birth: 1931/07/10 Referring Provider: [name], PA-C  Encounter Date: 01/19/2016      PT End of Session - 01/19/16 1010    Visit Number 7   Number of Visits 13   Date for PT Re-Evaluation 02/08/16   Authorization Type 7   Authorization Time Period of 10   PT Start Time 0952   PT Stop Time 1035   PT Time Calculation (min) 43 min   Activity Tolerance Patient tolerated treatment well   Behavior During Therapy [facility] for tasks assessed/performed      Past Medical History:  Diagnosis Date  . 174.06 March 2013   Right breast, T1c, N0, 12 mm; ER PR positive, HER-2/neu not over expressed. Not a candidate for adjuvant chemotherapy per [facility] tumor board.  . Diabetes mellitus without complication ([place])   . Foot drop, right October 2014   noted post mastectomy, conservative treatment was instituted with resolution, mild edema.  . Hyperlipidemia   . Hypertension     Past Surgical History:  Procedure Laterality Date  . BREAST BIOPSY Left 02/25/13   positive  . BREAST SURGERY Left 03-15-13   left mastectomywith SN biopsy  . COLON SURGERY  1991   Likely segmental resection for diverticulitis based on patient description.  . COLONOSCOPY    . EYE SURGERY Bilateral 04/2012  . intestinal balloon removed  1991   Likely surgery for diverticulitis.  Marland Kitchen MASTECTOMY Left    positive  . 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    There were no vitals filed for this visit.      Subjective Assessment - 01/19/16 0954    Subjective Pt reports she is doing okay. No new complaints.   Pertinent History L shoulder pain. 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 All of a sudden, she got  pain in her L shoulder about a couple of months ago which is getting a little worse.  Had an x-ray for her L shoulder which revealed arthritis.  Pt is R hand dominant.    Patient Stated Goals "No more pain."   Currently in Pain? No/denies   Pain Onset More than a month ago        There-ex   UBE L1 x 5 min with towel roll behind back to facilitate proper posture, cues for decreased shoulder shrug, upright posture and gentle scapular retraction   UE Ranger L UE flexion 2x10 Supine AAROM flexion with pipe 2x10 Supine L shoulder abduction AAROM with pipe to tolerance 2x10, limited to ~90 degrees Supine L shoulder ER AAROM with pipe to tolerance, towel roll prop, 2x10    Supine L scapular protraction 3x10, no assistance needed, just tactile cues for technique Mid rows with YTB 2x10 Low row with YTB 2x10 ER and IR with YTB 2x10, most difficulty with ER Supine L UE flexion with YTB 2x10 Standing L UE IR stretch behind back with strap 10 x5 sec, 2 sets, decreased L shoulder stiffness     Cues for proper exercise technique to target specific muscles, slow eccentric contractions for most effective strengthening, decreased shoulder shrug, scapular retraction and postural correction for proper GH positioning.  Manual: Gentle inferior mobs grade I and II 3x30 to  L shoulder with UE in ~70 degrees abduction Gentle distraction mobs grade I and II 3x30 to L shoulder with UE in ~70 degrees abduction Pt continues to have end range stiffness and discomfort at ~90 to 100 degrees                           PT Education - 01/19/16 1036    Education provided Yes   Education Details postural correction   Person(s) Educated Patient   Methods Explanation;Demonstration   Comprehension Verbalized understanding;Returned demonstration             PT Long Term Goals - 12/27/15 1011      PT LONG TERM GOAL #1   Title Patient will have a decrease in L shoulder pain to 4/10 or less  at worst to promote ability to raise her L arm, reach, and perform activities such as crocheting.    Baseline 7/10 L shoulder pain at worst.   Time 6   Period Weeks   Status New     PT LONG TERM GOAL #2   Title Patient will improve L shoulder ER strength by at least 1/2 MMT grade to promote ability to raise her L arm with less pain.    Baseline 4-/5 L shoulder ER   Time 6   Period Weeks   Status New     PT LONG TERM GOAL #3   Title Patient will improve her Quick Dash Disability/Symptom score by at least 10 % as a demonstration of improved function.    Baseline 34%   Time 6   Period Weeks   Status New     PT LONG TERM GOAL #4   Title Patient will improve L shoulder flexion AROM to at least 100 degrees to promote ability to raise her arm.    Baseline L shoulder flexion AROM 70 degrees with less pain.   Time 6   Period Weeks   Status New               Plan - 01/19/16 1024    Clinical Impression Statement Pt performed L UE ROM strengthening well this session with minimal increase in discomfort at end range. She remains most limited in abduction at ~90 degrees before joint stiffness and discomfort. Pt will benefit from continued scapular and shoulder strengthening and ROM to progress towards funcitonal goals.   Rehab Potential Fair   Clinical Impairments Affecting Rehab Potential (-): age, multiple comorbidities, pain, weakness. (+): motivation   PT Frequency 2x / week   PT Duration 6 weeks   PT Treatment/Interventions Therapeutic exercise;Manual techniques;Therapeutic activities;Neuromuscular re-education;Patient/family education;Aquatic Therapy   PT Next Visit Plan ROM, manual therapy, gentle strengthening, posture   Consulted and Agree with Plan of Care Patient      Patient will benefit from skilled therapeutic intervention in order to improve the following deficits and impairments:  Pain, Hypomobility, Decreased strength, Postural dysfunction, Decreased range of motion,  Improper body mechanics  Visit Diagnosis: Pain in left shoulder  Muscle weakness (generalized)  Stiffness of left shoulder, not elsewhere classified     Problem List Patient Active Problem List   Diagnosis Date Noted  . Osteoarthritis of left shoulder 12/22/2015  . Paresthesia and pain of right extremity 06/19/2015  . Diverticulosis 02/17/2015  . Adaptation reaction 02/16/2015  . At risk for falling 02/16/2015  . Benign hypertension with CKD (chronic kidney disease) stage III 02/16/2015  . Adult BMI 30+ 02/16/2015  .  Chronic kidney disease (CKD), stage III (moderate) 02/16/2015  . CN (constipation) 02/16/2015  . Diabetes ([place]) 02/16/2015  . Accumulation of fluid in tissues 02/16/2015  . H/O gastric ulcer 02/16/2015  . Hypercholesteremia 02/16/2015  . Insomnia 02/16/2015  . Gonalgia 02/16/2015  . Cramps of lower extremity 02/16/2015  . Fungal infection of toenail 02/16/2015  . Pain in shoulder 02/16/2015  . Arthritis 11/22/2014  . Helicobacter pylori gastrointestinal tract infection 05/18/2014  . Anemia, iron deficiency 03/27/2014  . Bilateral lower extremity edema 02/22/2014  . Breast cancer ([place]) 03/04/2013    Shubh Chiara Shiela Mayer, PT, DPT  01/19/16, 10:38 AM [place] [address] [place], [address] Phone: [phone]   Fax:  [phone]  Name: SHRUTI ARREY MRN: [id number] Date of Birth: August 18, 1931

## 2016-01-22 ENCOUNTER — Ambulatory Visit: Payer: Medicare Other

## 2016-01-22 DIAGNOSIS — M25512 Pain in left shoulder: Secondary | ICD-10-CM | POA: Diagnosis not present

## 2016-01-22 DIAGNOSIS — M25612 Stiffness of left shoulder, not elsewhere classified: Secondary | ICD-10-CM | POA: Diagnosis not present

## 2016-01-22 DIAGNOSIS — M6281 Muscle weakness (generalized): Secondary | ICD-10-CM | POA: Diagnosis not present

## 2016-01-22 NOTE — Therapy (Signed)
[place] [facility] [address], [place], [address] Phone: [phone]   Fax:  [phone]  Physical Therapy Treatment  Patient Details  Name: Robin Ashley MRN: [id number] Date of Birth: 1932/04/24 Referring Provider: [name], PA-C  Encounter Date: 01/22/2016      PT End of Session - 01/22/16 0939    Visit Number 8   Number of Visits 13   Date for PT Re-Evaluation 02/08/16   Authorization Type 8   Authorization Time Period of 10   PT Start Time 0939   PT Stop Time 1026   PT Time Calculation (min) 47 min   Activity Tolerance Patient tolerated treatment well   Behavior During Therapy [facility] for tasks assessed/performed      Past Medical History:  Diagnosis Date  . 174.06 March 2013   Right breast, T1c, N0, 12 mm; ER PR positive, HER-2/neu not over expressed. Not a candidate for adjuvant chemotherapy per [facility] tumor board.  . Diabetes mellitus without complication ([place])   . Foot drop, right October 2014   noted post mastectomy, conservative treatment was instituted with resolution, mild edema.  . Hyperlipidemia   . Hypertension     Past Surgical History:  Procedure Laterality Date  . BREAST BIOPSY Left 02/25/13   positive  . BREAST SURGERY Left 03-15-13   left mastectomywith SN biopsy  . COLON SURGERY  1991   Likely segmental resection for diverticulitis based on patient description.  . COLONOSCOPY    . EYE SURGERY Bilateral 04/2012  . intestinal balloon removed  1991   Likely surgery for diverticulitis.  Marland Kitchen MASTECTOMY Left    positive  . 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    There were no vitals filed for this visit.      Subjective Assessment - 01/22/16 0941    Subjective Pt states that her L shoulder is better. Some pain or discomfort when she moves her L arm all of a sudden without thinking. No pain at rest. 3-4/10 L  shoulder pain when raising her arm.  3-4/10 at worst for the past 5 days.    Pertinent History L shoulder pain. 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 All of a sudden, she got pain in her L shoulder about a couple of months ago which is getting a little worse.  Had an x-ray for her L shoulder which revealed arthritis.  Pt is R hand dominant.    Patient Stated Goals "No more pain."   Currently in Pain? Yes   Pain Score 4   3-4/10 when raing her L arm.   Pain Onset More than a month ago                      Objectives  Manual therapy:   L arm propped on table at around 70 degrees scaption: gentle sustained posterior, inferior, posterior/inferior pressure to humeral head at her L shoulder joint.   Improved mobility palpated.     There-ex seated L shoulder ER with pipe assist 10x3 Seated L shoulder ER resisting yellow band 10x2   standing L UE IR stretch behind back with towel 10x 3 seconds for 2 sets  Standing L shoulder IR resisting yellow band 10x2  Mid rows with YTB 2x10 Low row with YTB 2x10  L shoulder UE ranger: 10x 2 for flexion,     7x, then 5x  for scaption   Improved exercise technique, movement at target joints, use of target muscles after mod verbal, visual, tactile cues.    Improved L shoulder joint mobility with gentle sustained posterior, posterior/inferior, inferior pressure to L shoulder joint. Minimal to no crepitus palpated or heard today. Difficulty with L shoulder scaption AAROM secondary to weakness and pain (eases quickly with rest).            PT Education - 01/22/16 1015    Education provided Yes   Education Details ther-ex   [employer]) Educated Patient   Methods Explanation;Demonstration;Tactile cues;Verbal cues   Comprehension Returned demonstration;Verbalized understanding             PT Long Term Goals - 12/27/15 1011      PT LONG TERM GOAL #1   Title Patient will have a decrease in L shoulder pain to 4/10 or  less at worst to promote ability to raise her L arm, reach, and perform activities such as crocheting.    Baseline 7/10 L shoulder pain at worst.   Time 6   Period Weeks   Status New     PT LONG TERM GOAL #2   Title Patient will improve L shoulder ER strength by at least 1/2 MMT grade to promote ability to raise her L arm with less pain.    Baseline 4-/5 L shoulder ER   Time 6   Period Weeks   Status New     PT LONG TERM GOAL #3   Title Patient will improve her Quick Dash Disability/Symptom score by at least 10 % as a demonstration of improved function.    Baseline 34%   Time 6   Period Weeks   Status New     PT LONG TERM GOAL #4   Title Patient will improve L shoulder flexion AROM to at least 100 degrees to promote ability to raise her arm.    Baseline L shoulder flexion AROM 70 degrees with less pain.   Time 6   Period Weeks   Status New               Plan - 01/22/16 0935    Clinical Impression Statement Improved L shoulder joint mobility with gentle sustained posterior, posterior/inferior, inferior pressure to L shoulder joint. Minimal to no crepitus palpated or heard today. Difficulty with L shoulder scaption AAROM secondary to weakness and pain (eases quickly with rest).   Rehab Potential Fair   Clinical Impairments Affecting Rehab Potential (-): age, multiple comorbidities, pain, weakness. (+): motivation   PT Frequency 2x / week   PT Duration 6 weeks   PT Treatment/Interventions Therapeutic exercise;Manual techniques;Therapeutic activities;Neuromuscular re-education;Patient/family education;Aquatic Therapy   PT Next Visit Plan ROM, manual therapy, gentle strengthening, posture   Consulted and Agree with Plan of Care Patient      Patient will benefit from skilled therapeutic intervention in order to improve the following deficits and impairments:  Pain, Hypomobility, Decreased strength, Postural dysfunction, Decreased range of motion, Improper body  mechanics  Visit Diagnosis: Pain in left shoulder  Muscle weakness (generalized)  Stiffness of left shoulder, not elsewhere classified     Problem List Patient Active Problem List   Diagnosis Date Noted  . Osteoarthritis of left shoulder 12/22/2015  . Paresthesia and pain of right extremity 06/19/2015  . Diverticulosis 02/17/2015  . Adaptation reaction 02/16/2015  . At risk for falling 02/16/2015  . Benign hypertension with CKD (chronic kidney disease) stage III 02/16/2015  . Adult BMI 30+  02/16/2015  . Chronic kidney disease (CKD), stage III (moderate) 02/16/2015  . CN (constipation) 02/16/2015  . Diabetes ([place]) 02/16/2015  . Accumulation of fluid in tissues 02/16/2015  . H/O gastric ulcer 02/16/2015  . Hypercholesteremia 02/16/2015  . Insomnia 02/16/2015  . Gonalgia 02/16/2015  . Cramps of lower extremity 02/16/2015  . Fungal infection of toenail 02/16/2015  . Pain in shoulder 02/16/2015  . Arthritis 11/22/2014  . Helicobacter pylori gastrointestinal tract infection 05/18/2014  . Anemia, iron deficiency 03/27/2014  . Bilateral lower extremity edema 02/22/2014  . Breast cancer [facility]) 03/04/2013   Joneen Boers PT, DPT   01/22/2016, 12:34 PM  [place] [facility] [address] [place], [address] Phone: [phone]   Fax:  [phone]  Name: BRIEANA SHIMMIN MRN: [id number] Date of Birth: 06/11/1931

## 2016-01-24 ENCOUNTER — Encounter: Payer: Self-pay | Admitting: Physician Assistant

## 2016-01-24 ENCOUNTER — Ambulatory Visit (INDEPENDENT_AMBULATORY_CARE_PROVIDER_SITE_OTHER): Payer: Medicare Other | Admitting: Physician Assistant

## 2016-01-24 VITALS — BP 160/72 | HR 84 | Temp 98.0°F | Resp 16 | Wt 227.2 lb

## 2016-01-24 DIAGNOSIS — M19012 Primary osteoarthritis, left shoulder: Secondary | ICD-10-CM

## 2016-01-24 NOTE — Patient Instructions (Signed)
Adhesive Capsulitis Adhesive capsulitis is inflammation of the tendons and ligaments that surround the shoulder joint (shoulder capsule). This condition causes the shoulder to become stiff and painful to move. Adhesive capsulitis is also called frozen shoulder. CAUSES This condition may be caused by:  An injury to the shoulder joint.  Straining the shoulder.  Not moving the shoulder for a period of time. This can happen if your arm was injured or in a sling.  Long-standing health problems, such as:  Diabetes.  Thyroid problems.  Heart disease.  Stroke.  Rheumatoid arthritis.  Lung disease. In some cases, the cause may not be known. RISK FACTORS This condition is more likely to develop in:  Women.  People who are older than 80 years of age. SYMPTOMS Symptoms of this condition include:  Pain in the shoulder when moving the arm. There may also be pain when parts of the shoulder are touched. The pain is worse at night or when at rest.  Soreness or aching in the shoulder.  Inability to move the shoulder normally.  Muscle spasms. DIAGNOSIS This condition is diagnosed with a physical exam and imaging tests, such as an X-ray or MRI. TREATMENT This condition may be treated with:  Treatment of the underlying cause or condition.  Physical therapy. This involves performing exercises to get the shoulder moving again.  Medicine. Medicine may be given to relieve pain, inflammation, or muscle spasms.  Steroid injections into the shoulder joint.  Shoulder manipulation. This is a procedure to move the shoulder into another position. It is done after you are given a medicine to make you fall asleep (general anesthetic). The joint may also be injected with salt water at high pressure to break down scarring.  Surgery. This may be done in severe cases when other treatments have failed. Although most people recover completely from adhesive capsulitis, some may not regain the full  movement of the shoulder. HOME CARE INSTRUCTIONS  Take over-the-counter and prescription medicines only as told by your health care provider.  If you are being treated with physical therapy, follow instructions from your physical therapist.  Avoid exercises that put a lot of demand on your shoulder, such as throwing. These exercises can make pain worse.  If directed, apply ice to the injured area:  Put ice in a plastic bag.  Place a towel between your skin and the bag.  Leave the ice on for 20 minutes, 2-3 times per day. SEEK MEDICAL CARE IF:  You develop new symptoms.  Your symptoms get worse.   This information is not intended to replace advice given to you by your health care provider. Make sure you discuss any questions you have with your health care provider.   Document Released: 03/17/2009 Document Revised: 02/08/2015 Document Reviewed: 09/12/2014 Elsevier Interactive Patient Education [employer].

## 2016-01-24 NOTE — Progress Notes (Signed)
Patient: Robin Ashley Female    DOB: 1932/01/09   82 y.o.   MRN: [id number] Visit Date: 01/24/2016  Today's Provider: Mar Daring, PA-C   Chief Complaint  Patient presents with  . Follow-up    Left Shoulder   Subjective:    HPI Patient is here for her 4 week follow up left shoulder pain. Left Shoulder was Xray and showed Arthritis. Patient was referred to PT. Treatment: on Meloxicam.Patient reports that she going to physical therapy at [facility]. Patient reports that feels the therapy is helping. She reports noticing about 40-50% improvement thus far.    No Known Allergies Current Meds  Medication Sig  . amLODipine (NORVASC) 5 MG tablet   . aspirin 81 MG tablet Take 1 tablet (81 mg total) by mouth daily.  Marland Kitchen atorvastatin (LIPITOR) 80 MG tablet TAKE 1 TABLET DAILY  . ferrous sulfate ([employer] IRON) 325 (65 FE) MG tablet Take 1 tablet (325 mg total) by mouth 2 (two) times daily.  . furosemide (LASIX) 20 MG tablet TAKE 1 TABLET DAILY  . glucose blood test strip   . letrozole (FEMARA) 2.5 MG tablet Take 1 tablet (2.5 mg total) by mouth daily.  Marland Kitchen lisinopril (PRINIVIL,ZESTRIL) 20 MG tablet   . meloxicam (MOBIC) 15 MG tablet TAKE 1 TABLET DAILY  . metFORMIN (GLUCOPHAGE) 500 MG tablet TAKE 1 TABLET TWICE A DAY  . Multiple Vitamins-Minerals (CENTRUM SILVER PO) Take 1 tablet by mouth daily.  Marland Kitchen omeprazole (PRILOSEC) 20 MG capsule Take 1 capsule (20 mg total) by mouth daily.  . polyethylene glycol powder (GLYCOLAX/MIRALAX) powder   . traZODone (DESYREL) 50 MG tablet TAKE 1 TABLET EVERY EVENING    Review of Systems  Constitutional: Negative.   Respiratory: Negative for cough, chest tightness and shortness of breath.   Cardiovascular: Negative for chest pain, palpitations and leg swelling.  Gastrointestinal: Negative for abdominal pain.  Musculoskeletal: Positive for arthralgias. Negative for joint swelling, myalgias, neck pain and neck stiffness.    Social History  Substance  Use Topics  . Smoking status: Never Smoker  . Smokeless tobacco: Never Used  . Alcohol use No   Objective:   BP (!) 160/72 (BP Location: Right Arm, Patient Position: Sitting, Cuff Size: Large)   Pulse 84   Temp 98 F (36.7 C) (Oral)   Resp 16   Wt 227 lb 3.2 oz (103.1 kg)   BMI 35.58 kg/m   Physical Exam  Constitutional: She appears well-developed and well-nourished. No distress.  Neck: Normal range of motion. Neck supple.  Cardiovascular: Normal rate, regular rhythm and normal heart sounds.  Exam reveals no gallop and no friction rub.   No murmur heard. Pulmonary/Chest: Effort normal and breath sounds normal. No respiratory distress. She has no wheezes. She has no rales.  Musculoskeletal:       Right shoulder: Normal. She exhibits normal range of motion, no tenderness and no bony tenderness.       Left shoulder: She exhibits decreased range of motion and tenderness. She exhibits no bony tenderness.  Skin: She is not diaphoretic.  Vitals reviewed.     Assessment & Plan:     1. Osteoarthritis of left shoulder, unspecified osteoarthritis type Improving. Continue PT and meloxicam with food. She is to call if any changes or worsening symptoms. I will see her back in 6 weeks to recheck her shoulder, recheck her HgBA1c and get her flu vaccine.       Mar Daring,  PA-C  [facility]

## 2016-01-25 ENCOUNTER — Ambulatory Visit: Payer: Medicare Other

## 2016-01-25 DIAGNOSIS — M25612 Stiffness of left shoulder, not elsewhere classified: Secondary | ICD-10-CM | POA: Diagnosis not present

## 2016-01-25 DIAGNOSIS — M25512 Pain in left shoulder: Secondary | ICD-10-CM

## 2016-01-25 DIAGNOSIS — M6281 Muscle weakness (generalized): Secondary | ICD-10-CM

## 2016-01-25 NOTE — Therapy (Signed)
[facility] [address] [place], [address] Phone: [phone]   Fax:  [phone]  Physical Therapy Treatment And Progress Report   Patient Details  Name: Robin Ashley MRN: [id number] Date of Birth: 10-07-1931 Referring Provider: [name], PA-C  Encounter Date: 01/25/2016      PT End of Session - 01/25/16 0950    Visit Number 9   Number of Visits 13   Date for PT Re-Evaluation 02/08/16   Authorization Type 1   Authorization Time Period of 10   PT Start Time 0950   PT Stop Time 1038   PT Time Calculation (min) 48 min   Activity Tolerance Patient tolerated treatment well   Behavior During Therapy [facility] for tasks assessed/performed      Past Medical History:  Diagnosis Date  . 174.06 March 2013   Right breast, T1c, N0, 12 mm; ER PR positive, HER-2/neu not over expressed. Not a candidate for adjuvant chemotherapy per [facility] tumor board.  . Diabetes mellitus without complication ([place])   . Foot drop, right October 2014   noted post mastectomy, conservative treatment was instituted with resolution, mild edema.  . Hyperlipidemia   . Hypertension     Past Surgical History:  Procedure Laterality Date  . BREAST BIOPSY Left 02/25/13   positive  . BREAST SURGERY Left 03-15-13   left mastectomywith SN biopsy  . COLON SURGERY  1991   Likely segmental resection for diverticulitis based on patient description.  . COLONOSCOPY    . EYE SURGERY Bilateral 04/2012  . intestinal balloon removed  1991   Likely surgery for diverticulitis.  Marland Kitchen MASTECTOMY Left    positive  . 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    There were no vitals filed for this visit.      Subjective Assessment - 01/25/16 0952    Subjective Pt states that her L shoulder bothered her last night in bed. Tossed and turned last night. 4/10 L shoulder pain at most for the  past 7 days.    Pertinent History L shoulder pain. 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 All of a sudden, she got pain in her L shoulder about a couple of months ago which is getting a little worse.  Had an x-ray for her L shoulder which revealed arthritis.  Pt is R hand dominant.    Patient Stated Goals "No more pain."   Currently in Pain? Yes   Pain Score 3   0/10 at rest, 3/10 when raising her L arm.   Pain Onset More than a month ago            [facility] PT Assessment - 01/25/16 0955      Observation/Other Assessments   Quick DASH  34%     AROM   Left Shoulder Flexion 84 Degrees  no pain     Strength   Left Shoulder External Rotation 4/5  no pain                    Objectives  Manual therapy:   L arm propped on table at around 70 degrees scaption: gentle sustained posterior, inferior, posterior/inferior pressure to humeral head at her L shoulder joint.                        Improved mobility palpated.     There-ex  Directed patient with manually resisted L shoulder ER 1x Standing L shoulder flexion AROM 1x  Reviewed progress/current status with L shoulder ER strength and L shoulder flexion AROM with pt.   Seated L shoulder flexion AAROM using SPC on floor 10x5 seconds for 2 sets.    Seated L shoulder scaption AAROM using SPC on floor 10x5 seconds for 2 sets  Reviewed HEP. Please see patient instructions.    Mid rows with YTB 2x10 Low row with YTB 2x10   seated L shoulder ER with SPC assist 10x3 Seated L shoulder ER resisting yellow band 10x2 (loop around R wrist secondary to carpal tunnel)   Improved exercise technique, movement at target joints, use of target muscles after min to mod verbal, visual, tactile cues.       Pt has demonstrated improved L shoulder ER strength, AROM (by 14 degrees), and overall decreased L shoulder pain since initial evaluation. She still demonstrates pain in her L shoulder, difficulty raising her L arm  due to her symptoms and weakness, and difficulty performing functional tasks and would benefit from continued skilled physical therapy intervention to address the aforementioned deficits.                 PT Education - 01/25/16 1009    Education provided Yes   Education Details ther-ex, HEP   Person(s) Educated Patient   Methods Explanation;Demonstration;Tactile cues;Verbal cues;Handout   Comprehension Verbalized understanding;Returned demonstration             PT Long Term Goals - 01/25/16 1049      PT LONG TERM GOAL #1   Title Patient will have a decrease in L shoulder pain to 4/10 or less at worst to promote ability to raise her L arm, reach, and perform activities such as crocheting.    Baseline 7/10 L shoulder pain at worst. 4/10 L shoulder pain at most for the past 7 days (01/25/2016)   Time 6   Period Weeks   Status Achieved     PT LONG TERM GOAL #2   Title Patient will improve L shoulder ER strength by at least 1/2 MMT grade to promote ability to raise her L arm with less pain.    Baseline 4-/5 L shoulder ER; 4/5 L shoulder ER (01/25/2016)   Time 6   Period Weeks   Status Achieved     PT LONG TERM GOAL #3   Title Patient will improve her Quick Dash Disability/Symptom score by at least 10 % as a demonstration of improved function.    Baseline 34% initial and current score (01/25/2016)   Time 6   Period Weeks   Status On-going     PT LONG TERM GOAL #4   Title Patient will improve L shoulder flexion AROM to at least 100 degrees to promote ability to raise her arm.    Baseline L shoulder flexion AROM 70 degrees; 84 degrees L shoulder flexion AROM (01/25/2016)   Time 6   Period Weeks   Status On-going     PT LONG TERM GOAL #5   Title Patient will improve L shoulder ER strength to at least 4+/5 to promote ability to raise her L arm with less pain.    Baseline current ER strength: 4/5   Time 2   Period Weeks   Status New               Plan -  01/25/16 1017    Clinical Impression Statement  Pt has demonstrated  improved L shoulder ER strength, AROM (by 14 degrees), and overall decreased L shoulder pain since initial evaluation. She still demonstrates pain in her L shoulder, difficulty raising her L arm due to her symptoms and weakness, and difficulty performing functional tasks and would benefit from continued skilled physical therapy intervention to address the aforementioned deficits.    Rehab Potential Fair   Clinical Impairments Affecting Rehab Potential (-): age, multiple comorbidities, pain, weakness. (+): motivation   PT Frequency 2x / week   PT Duration 6 weeks   PT Treatment/Interventions Therapeutic exercise;Manual techniques;Therapeutic activities;Neuromuscular re-education;Patient/family education;Aquatic Therapy   PT Next Visit Plan ROM, manual therapy, gentle strengthening, posture   Consulted and Agree with Plan of Care Patient      Patient will benefit from skilled therapeutic intervention in order to improve the following deficits and impairments:  Pain, Hypomobility, Decreased strength, Postural dysfunction, Decreased range of motion, Improper body mechanics  Visit Diagnosis: Pain in left shoulder  Muscle weakness (generalized)  Stiffness of left shoulder, not elsewhere classified       G-Codes - 2016/02/20 1057    Functional Assessment Tool Used QuickDash Disability Symptom Score, clinical presentation, patient interview   Functional Limitation Carrying, moving and handling objects   Carrying, Moving and Handling Objects Current Status (Y6378) At least 20 percent but less than 40 percent impaired, limited or restricted   Carrying, Moving and Handling Objects Goal Status (H8850) At least 1 percent but less than 20 percent impaired, limited or restricted      Problem List Patient Active Problem List   Diagnosis Date Noted  . Osteoarthritis of left shoulder 12/22/2015  . Paresthesia and pain of right  extremity 06/19/2015  . Diverticulosis 02/17/2015  . Adaptation reaction 02/16/2015  . At risk for falling 02/16/2015  . Benign hypertension with CKD (chronic kidney disease) stage III 02/16/2015  . Adult BMI 30+ 02/16/2015  . Chronic kidney disease (CKD), stage III (moderate) 02/16/2015  . CN (constipation) 02/16/2015  . Diabetes ([place]) 02/16/2015  . Accumulation of fluid in tissues 02/16/2015  . H/O gastric ulcer 02/16/2015  . Hypercholesteremia 02/16/2015  . Insomnia 02/16/2015  . Gonalgia 02/16/2015  . Cramps of lower extremity 02/16/2015  . Fungal infection of toenail 02/16/2015  . Pain in shoulder 02/16/2015  . Arthritis 11/22/2014  . Helicobacter pylori gastrointestinal tract infection 05/18/2014  . Anemia, iron deficiency 03/27/2014  . Bilateral lower extremity edema 02/22/2014  . Breast cancer ([place]) 03/04/2013    Thank you for your referral.   Joneen Boers PT, DPT   2016-02-20, 11:04 AM  [facility] [address] [place], [address] Phone: [phone]   Fax:  [phone]  Name: SOMALIA SEGLER MRN: [id number] Date of Birth: December 14, 1931

## 2016-01-25 NOTE — Patient Instructions (Addendum)
   Seated L shoulder flexion and scaption AAROM using SPC (SPC tip on floor) 10x3 with 5 seconds for each as part of her HEP. Handout provided. Pt demonstrated and verbalized understanding.

## 2016-01-29 ENCOUNTER — Ambulatory Visit: Payer: Medicare Other

## 2016-01-29 DIAGNOSIS — M25612 Stiffness of left shoulder, not elsewhere classified: Secondary | ICD-10-CM

## 2016-01-29 DIAGNOSIS — M25512 Pain in left shoulder: Secondary | ICD-10-CM | POA: Diagnosis not present

## 2016-01-29 DIAGNOSIS — M6281 Muscle weakness (generalized): Secondary | ICD-10-CM | POA: Diagnosis not present

## 2016-01-29 NOTE — Therapy (Signed)
[facility] [address] [place], [address] Phone: [phone]   Fax:  [phone]  Physical Therapy Treatment  Patient Details  Name: Robin Ashley MRN: [id number] Date of Birth: [birth year] Referring Provider: [name], PA-C  Encounter Date: 01/29/2016      PT End of Session - 01/29/16 0950    Visit Number 10   Number of Visits 13   Date for PT Re-Evaluation 02/08/16   Authorization Type 2   Authorization Time Period of 10   PT Start Time 0950   PT Stop Time 1043   PT Time Calculation (min) 53 min   Activity Tolerance Patient tolerated treatment well   Behavior During Therapy [facility] for tasks assessed/performed      Past Medical History:  Diagnosis Date  . 174.06 March 2013   Right breast, T1c, N0, 12 mm; ER PR positive, HER-2/neu not over expressed. Not a candidate for adjuvant chemotherapy per [facility] tumor board.  . Diabetes mellitus without complication ([place])   . Foot drop, right October 2014   noted post mastectomy, conservative treatment was instituted with resolution, mild edema.  . Hyperlipidemia   . Hypertension     Past Surgical History:  Procedure Laterality Date  . BREAST BIOPSY Left 02/25/13   positive  . BREAST SURGERY Left 03-15-13   left mastectomywith SN biopsy  . COLON SURGERY  1991   Likely segmental resection for diverticulitis based on patient description.  . COLONOSCOPY    . EYE SURGERY Bilateral 04/2012  . intestinal balloon removed  1991   Likely surgery for diverticulitis.  Marland Kitchen MASTECTOMY Left    positive  . 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    There were no vitals filed for this visit.      Subjective Assessment - 01/29/16 0952    Subjective L shoulder feel a lot better. 1-2/10 L shoulder pain when raising her L arm up.    Pertinent History L shoulder pain. Pt states she does a lot of knitting  work. All of a sudden, she got pain in her L shoulder about a couple of months ago which is getting a little worse.  Had an x-ray for her L shoulder which revealed arthritis.  Pt is R hand dominant.    Patient Stated Goals "No more pain."   Currently in Pain? Yes   Pain Score 2   1-2/10 when raising her L arm up. No pain at rest.    Pain Onset More than a month ago                            Objectives  Manual therapy:   L arm propped on table at around 70 degrees scaption: gentle sustained posterior, inferior, posterior/inferior pressure to humeral head at her L shoulder joint.  Improved mobility palpated.     There-ex  Directed patient withL shoulder UE ranger: flexion 10x2  scaption 5x3  Seated L shoulder ER resisting yellow band 10x3 (upgraded set number; loop around R wrist secondary to carpal tunnel). Good infraspinatus muscle use palpated.   Mid rows with YTB 2x10 Low row with YTB 10x. L lateral arm discomfort. Decreased with rest  L shoulder extension resisting yellow band 10x2  Wall push-ups using the treadmill bars 5x3  Ball rolls up the wall 5x. L shoulder crepitus felt by pt. Decreased with  rest.   L shoulder flexion isometrics against the wall 2x5 with 5 second holds.     Improved exercise technique, movement at target joints, use of target muscles after min to mod verbal, visual, tactile cues.     Good infraspinatus muscle use felt with T-band ER exercise. Pt tolerated session without aggravation of symptoms. Pt making progress with decreased L shoulder pain when raising her L arm.            PT Education - 01/29/16 1004    Education provided Yes   Education Details ther-ex   [employer]) Educated Patient   Methods Explanation;Demonstration;Tactile cues;Verbal cues   Comprehension Returned demonstration;Verbalized understanding             PT Long Term Goals - 01/25/16 1049      PT LONG TERM  GOAL #1   Title Patient will have a decrease in L shoulder pain to 4/10 or less at worst to promote ability to raise her L arm, reach, and perform activities such as crocheting.    Baseline 7/10 L shoulder pain at worst. 4/10 L shoulder pain at most for the past 7 days (01/25/2016)   Time 6   Period Weeks   Status Achieved     PT LONG TERM GOAL #2   Title Patient will improve L shoulder ER strength by at least 1/2 MMT grade to promote ability to raise her L arm with less pain.    Baseline 4-/5 L shoulder ER; 4/5 L shoulder ER (01/25/2016)   Time 6   Period Weeks   Status Achieved     PT LONG TERM GOAL #3   Title Patient will improve her Quick Dash Disability/Symptom score by at least 10 % as a demonstration of improved function.    Baseline 34% initial and current score (01/25/2016)   Time 6   Period Weeks   Status On-going     PT LONG TERM GOAL #4   Title Patient will improve L shoulder flexion AROM to at least 100 degrees to promote ability to raise her arm.    Baseline L shoulder flexion AROM 70 degrees; 84 degrees L shoulder flexion AROM (01/25/2016)   Time 6   Period Weeks   Status On-going     PT LONG TERM GOAL #5   Title Patient will improve L shoulder ER strength to at least 4+/5 to promote ability to raise her L arm with less pain.    Baseline current ER strength: 4/5   Time 2   Period Weeks   Status New               Plan - 01/29/16 1011    Clinical Impression Statement Good infraspinatus muscle use felt with T-band ER exercise. Pt tolerated session without aggravation of symptoms. Pt making progress with decreased L shoulder pain when raising her L arm.   Rehab Potential Fair   Clinical Impairments Affecting Rehab Potential (-): age, multiple comorbidities, pain, weakness. (+): motivation   PT Frequency 2x / week   PT Duration 6 weeks   PT Treatment/Interventions Therapeutic exercise;Manual techniques;Therapeutic activities;Neuromuscular  re-education;Patient/family education;Aquatic Therapy   PT Next Visit Plan ROM, manual therapy, gentle strengthening, posture   Consulted and Agree with Plan of Care Patient      Patient will benefit from skilled therapeutic intervention in order to improve the following deficits and impairments:  Pain, Hypomobility, Decreased strength, Postural dysfunction, Decreased range of motion, Improper body mechanics  Visit Diagnosis: Pain in  left shoulder  Muscle weakness (generalized)  Stiffness of left shoulder, not elsewhere classified     Problem List Patient Active Problem List   Diagnosis Date Noted  . Osteoarthritis of left shoulder 12/22/2015  . Paresthesia and pain of right extremity 06/19/2015  . Diverticulosis 02/17/2015  . Adaptation reaction 02/16/2015  . At risk for falling 02/16/2015  . Benign hypertension with CKD (chronic kidney disease) stage III 02/16/2015  . Adult BMI 30+ 02/16/2015  . Chronic kidney disease (CKD), stage III (moderate) 02/16/2015  . CN (constipation) 02/16/2015  . Diabetes ([place]) 02/16/2015  . Accumulation of fluid in tissues 02/16/2015  . H/O gastric ulcer 02/16/2015  . Hypercholesteremia 02/16/2015  . Insomnia 02/16/2015  . Gonalgia 02/16/2015  . Cramps of lower extremity 02/16/2015  . Fungal infection of toenail 02/16/2015  . Pain in shoulder 02/16/2015  . Arthritis 11/22/2014  . Helicobacter pylori gastrointestinal tract infection 05/18/2014  . Anemia, iron deficiency 03/27/2014  . Bilateral lower extremity edema 02/22/2014  . Breast cancer ([place]) 03/04/2013   Joneen Boers PT, DPT   01/29/2016, 10:53 AM  [facility] [address] [place], [address] Phone: [phone]   Fax:  [phone]  Name: MELYNA HURON MRN: [id number] Date of Birth: March 29, 1932

## 2016-01-30 ENCOUNTER — Other Ambulatory Visit: Payer: Self-pay | Admitting: General Surgery

## 2016-02-01 ENCOUNTER — Ambulatory Visit: Payer: Medicare Other

## 2016-02-01 DIAGNOSIS — M25512 Pain in left shoulder: Secondary | ICD-10-CM

## 2016-02-01 DIAGNOSIS — M6281 Muscle weakness (generalized): Secondary | ICD-10-CM

## 2016-02-01 DIAGNOSIS — M25612 Stiffness of left shoulder, not elsewhere classified: Secondary | ICD-10-CM

## 2016-02-01 NOTE — Therapy (Signed)
[facility] [address], [place], [address] Phone: [phone]   Fax:  [phone]  Physical Therapy Treatment  Patient Details  Name: Robin Ashley MRN: [id number] Date of Birth: May 02, 1932 Referring Provider: [name], PA-C  Encounter Date: 02/01/2016      PT End of Session - 02/01/16 1036    Visit Number 11   Number of Visits 13   Date for PT Re-Evaluation 02/08/16   Authorization Type 3   Authorization Time Period of 10   PT Start Time 1037   PT Stop Time 1122   PT Time Calculation (min) 45 min   Activity Tolerance Patient tolerated treatment well   Behavior During Therapy [facility] for tasks assessed/performed      Past Medical History:  Diagnosis Date  . 174.06 March 2013   Right breast, T1c, N0, 12 mm; ER PR positive, HER-2/neu not over expressed. Not a candidate for adjuvant chemotherapy per [facility] tumor board.  . Diabetes mellitus without complication ([place])   . Foot drop, right October 2014   noted post mastectomy, conservative treatment was instituted with resolution, mild edema.  . Hyperlipidemia   . Hypertension     Past Surgical History:  Procedure Laterality Date  . BREAST BIOPSY Left 02/25/13   positive  . BREAST SURGERY Left 03-15-13   left mastectomywith SN biopsy  . COLON SURGERY  1991   Likely segmental resection for diverticulitis based on patient description.  . COLONOSCOPY    . EYE SURGERY Bilateral 04/2012  . intestinal balloon removed  1991   Likely surgery for diverticulitis.  Marland Kitchen MASTECTOMY Left    positive  . 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    There were no vitals filed for this visit.      Subjective Assessment - 02/01/16 1037    Subjective 2/10 L shoulder pain at rest currently. Pt has been doing a lot of crocheting because she had to get a project done. Pt states that the ER rotation motion  of the knitting and tatting bothers her L shoulder    Pertinent History L shoulder pain. 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 All of a sudden, she got pain in her L shoulder about a couple of months ago which is getting a little worse.  Had an x-ray for her L shoulder which revealed arthritis.  Pt is R hand dominant.    Patient Stated Goals "No more pain."   Currently in Pain? Yes   Pain Onset More than a month ago               Objectives  Manual therapy:   L arm propped on table at around 70 degrees scaption: gentle sustained posterior, inferior, posterior/inferior pressure to humeral head at her L shoulder joint.  l    There-ex  Directed patient withseated L shoulder ER resisting yellow band 10x3   L shoulder UE ranger: flexion 5x4                       scaption 5x3  Seated L shoulder ER using SPC 10x3 with 5 second holds  Reviewed HEP. Please see pt instructions. Pt demonstrated and verbalized understanding.   L shoulder flexion with SPC on ground with PT manual resistance 10x2  L shoulder extension (from flexion position)  with SPC on ground with PT manual resistance 10x2  Seated bilateral scapular retraction 10x 5 seconds  Seated chin tucks 10x5 seconds to promote upper thoracic extension and posterior scapular tipping.    Improved exercise technique, movement at target joints, use of target muscles after min to mod verbal, visual, tactile cues.     Worked on ER ROM and strength secondary to ER movements with knitting and crocheting bothers her L shoulder. Also worked on scapular retraction and chin tucks to promote upper thoracic extension to help promote posterior tipping of her L scapula to help decrease pain with her daily tasks.                  PT Education - 02/01/16 1052    Education provided Yes   Education Details ther-ex   [employer]) Educated Patient   Methods Explanation;Demonstration;Tactile  cues;Verbal cues   Comprehension Returned demonstration;Verbalized understanding             PT Long Term Goals - 01/25/16 1049      PT LONG TERM GOAL #1   Title Patient will have a decrease in L shoulder pain to 4/10 or less at worst to promote ability to raise her L arm, reach, and perform activities such as crocheting.    Baseline 7/10 L shoulder pain at worst. 4/10 L shoulder pain at most for the past 7 days (01/25/2016)   Time 6   Period Weeks   Status Achieved     PT LONG TERM GOAL #2   Title Patient will improve L shoulder ER strength by at least 1/2 MMT grade to promote ability to raise her L arm with less pain.    Baseline 4-/5 L shoulder ER; 4/5 L shoulder ER (01/25/2016)   Time 6   Period Weeks   Status Achieved     PT LONG TERM GOAL #3   Title Patient will improve her Quick Dash Disability/Symptom score by at least 10 % as a demonstration of improved function.    Baseline 34% initial and current score (01/25/2016)   Time 6   Period Weeks   Status On-going     PT LONG TERM GOAL #4   Title Patient will improve L shoulder flexion AROM to at least 100 degrees to promote ability to raise her arm.    Baseline L shoulder flexion AROM 70 degrees; 84 degrees L shoulder flexion AROM (01/25/2016)   Time 6   Period Weeks   Status On-going     PT LONG TERM GOAL #5   Title Patient will improve L shoulder ER strength to at least 4+/5 to promote ability to raise her L arm with less pain.    Baseline current ER strength: 4/5   Time 2   Period Weeks   Status New               Plan - 02/01/16 1056    Clinical Impression Statement Worked on ER ROM and strength secondary to ER movements with knitting and crocheting bothers her L shoulder. Also worked on scapular retraction and chin tucks to promote upper thoracic extension to help promote posterior tipping of her L scapula to help decrease pain with her daily tasks.    Rehab Potential Fair   Clinical Impairments  Affecting Rehab Potential (-): age, multiple comorbidities, pain, weakness. (+): motivation   PT Frequency 2x / week   PT Duration 6 weeks   PT Treatment/Interventions Therapeutic exercise;Manual techniques;Therapeutic activities;Neuromuscular re-education;Patient/family education;Aquatic Therapy   PT Next Visit Plan ROM, manual therapy, gentle strengthening,  posture   Consulted and Agree with Plan of Care Patient      Patient will benefit from skilled therapeutic intervention in order to improve the following deficits and impairments:  Pain, Hypomobility, Decreased strength, Postural dysfunction, Decreased range of motion, Improper body mechanics  Visit Diagnosis: Pain in left shoulder  Muscle weakness (generalized)  Stiffness of left shoulder, not elsewhere classified     Problem List Patient Active Problem List   Diagnosis Date Noted  . Osteoarthritis of left shoulder 12/22/2015  . Paresthesia and pain of right extremity 06/19/2015  . Diverticulosis 02/17/2015  . Adaptation reaction 02/16/2015  . At risk for falling 02/16/2015  . Benign hypertension with CKD (chronic kidney disease) stage III 02/16/2015  . Adult BMI 30+ 02/16/2015  . Chronic kidney disease (CKD), stage III (moderate) 02/16/2015  . CN (constipation) 02/16/2015  . Diabetes ([place]) 02/16/2015  . Accumulation of fluid in tissues 02/16/2015  . H/O gastric ulcer 02/16/2015  . Hypercholesteremia 02/16/2015  . Insomnia 02/16/2015  . Gonalgia 02/16/2015  . Cramps of lower extremity 02/16/2015  . Fungal infection of toenail 02/16/2015  . Pain in shoulder 02/16/2015  . Arthritis 11/22/2014  . Helicobacter pylori gastrointestinal tract infection 05/18/2014  . Anemia, iron deficiency 03/27/2014  . Bilateral lower extremity edema 02/22/2014  . Breast cancer ([place]) 03/04/2013    Joneen Boers PT, DPT   02/01/2016, 12:30 PM  [facility] [address], [place], [address] Phone: [phone]   Fax:  [phone]  Name: MARYCARMEN HAGEY MRN: [id number] Date of Birth: 23-Oct-1931

## 2016-02-01 NOTE — Patient Instructions (Addendum)
     Upper Cervical Flexion / Extension  Do not follow first picture.  Gently flex and extend upper neck by nodding head (give yourself a double chin). Try to make a "long neck". Hold ___5_ seconds. Repeat _10___ times per set. Do 3____ sets per session. Do ___1_ sessions per day.  http://orth.exer.us/351   Copyright  VHI. All rights reserved.       Pt was recommended to perform her seated L shoulder ER with SPC exercise 10x3 with 5 seconds prior to performing her knitting or tatting or her seated ER with yellow band exercise. Pt demonstrated and verbalized understanding.

## 2016-02-06 ENCOUNTER — Ambulatory Visit: Payer: Medicare Other | Attending: Physician Assistant

## 2016-02-06 DIAGNOSIS — M6281 Muscle weakness (generalized): Secondary | ICD-10-CM | POA: Diagnosis not present

## 2016-02-06 DIAGNOSIS — M25512 Pain in left shoulder: Secondary | ICD-10-CM | POA: Insufficient documentation

## 2016-02-06 DIAGNOSIS — M25612 Stiffness of left shoulder, not elsewhere classified: Secondary | ICD-10-CM | POA: Diagnosis not present

## 2016-02-06 NOTE — Therapy (Signed)
[place] [facility] [address] [place], [address] Phone: [phone]   Fax:  [phone]  Physical Therapy Treatment  Patient Details  Name: Robin Ashley MRN: [id number] Date of Birth: [birth year] Referring Provider: [name], PA-C  Encounter Date: 02/06/2016      PT End of Session - 02/06/16 1032    Visit Number 12   Number of Visits 13   Date for PT Re-Evaluation 02/08/16   Authorization Type 4   Authorization Time Period of 10   PT Start Time 1032   PT Stop Time 1116   PT Time Calculation (min) 44 min   Activity Tolerance Patient tolerated treatment well   Behavior During Therapy [facility] for tasks assessed/performed      Past Medical History:  Diagnosis Date  . 174.06 March 2013   Right breast, T1c, N0, 12 mm; ER PR positive, HER-2/neu not over expressed. Not a candidate for adjuvant chemotherapy per [facility] tumor board.  . Diabetes mellitus without complication ([place])   . Foot drop, right October 2014   noted post mastectomy, conservative treatment was instituted with resolution, mild edema.  . Hyperlipidemia   . Hypertension     Past Surgical History:  Procedure Laterality Date  . BREAST BIOPSY Left 02/25/13   positive  . BREAST SURGERY Left 03-15-13   left mastectomywith SN biopsy  . COLON SURGERY  1991   Likely segmental resection for diverticulitis based on patient description.  . COLONOSCOPY    . EYE SURGERY Bilateral 04/2012  . intestinal balloon removed  1991   Likely surgery for diverticulitis.  Marland Kitchen MASTECTOMY Left    positive  . 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    There were no vitals filed for this visit.      Subjective Assessment - 02/06/16 1034    Subjective 1/10 L shoulder pain when raising her L arm. No pain at rest. Have not yet done knitting or crocheting since last time because of being on the go.  Pt  states that she thinks she is progressing pretty good towards her goals for her L shoulder.  Feels good to go with  her exercises at home after 02/08/16.   Pertinent History L shoulder pain. 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 All of a sudden, she got pain in her L shoulder about a couple of months ago which is getting a little worse.  Had an x-ray for her L shoulder which revealed arthritis.  Pt is R hand dominant.    Patient Stated Goals "No more pain."   Currently in Pain? Yes   Pain Score 1   with L shoulder flexion   Pain Onset More than a month ago            [facility] PT Assessment - 02/06/16 1039      Observation/Other Assessments   Quick DASH  11.4%     AROM   Left Shoulder Flexion 86 Degrees     Strength   Left Shoulder External Rotation 4/5                             PT Education - 02/06/16 1111    Education provided Yes   Education Details ther-ex, plan of care   Person(s) Educated Patient   Methods Explanation;Demonstration;Tactile cues;Verbal cues   Comprehension Returned demonstration;Verbalized understanding  Objectives  Manual therapy:   L arm propped on table at around 70 degrees scaption: gentle sustained posterior, inferior, posterior/inferior pressure to humeral head at her L shoulder joint.   STM L anterior shoulder   STM L teres major muscle area    There-ex  Directed patient withstanding L shoulder flexion AROM multiple times  Standing manually resisted L shoulder ER 2x   Reviewed progress with L shoulder flexion AROM and ER strength  Reviewed plan of care: planned graduation date 02/08/16. Per pt, she feels like she can continue her progress with exercises at home after that date.   Standing L shoulder UE ranger: flexion 5x2     Then with 5 second holds at end range (around 90 degrees) 2x5 scaption 5x2 with 5 second holds  seated L shoulder ER resisting  yellow band 10x3  Seated chin tucks 10x5 seconds for 2 sets to promote upper thoracic extension and posterior scapular tipping.      Improved exercise technique, movement at target joints, use of target muscles after min to mod verbal, visual, tactile cues.     Pt demonstrates overall improved function based on her Katina Dung compared to initial evaluation. Pt tolerated session well without aggravation of symptoms.                  PT Long Term Goals - 02/06/16 1234      PT LONG TERM GOAL #1   Title Patient will have a decrease in L shoulder pain to 4/10 or less at worst to promote ability to raise her L arm, reach, and perform activities such as crocheting.    Baseline 7/10 L shoulder pain at worst. 4/10 L shoulder pain at most for the past 7 days (01/25/2016)   Time 6   Period Weeks   Status Achieved     PT LONG TERM GOAL #2   Title Patient will improve L shoulder ER strength by at least 1/2 MMT grade to promote ability to raise her L arm with less pain.    Baseline 4-/5 L shoulder ER; 4/5 L shoulder ER (01/25/2016)   Time 6   Period Weeks   Status Achieved     PT LONG TERM GOAL #3   Title Patient will improve her Quick Dash Disability/Symptom score by at least 10 % as a demonstration of improved function.    Baseline 34% initial and current score (01/25/2016); current score: 11.4% (02/06/2016)   Time 6   Period Weeks   Status Achieved     PT LONG TERM GOAL #4   Title Patient will improve L shoulder flexion AROM to at least 100 degrees to promote ability to raise her arm.    Baseline L shoulder flexion AROM 70 degrees; 84 degrees L shoulder flexion AROM (01/25/2016); 86 degrees L shoulder flexion AROM (02/06/2016)   Time 6   Period Weeks   Status On-going     PT LONG TERM GOAL #5   Title Patient will improve L shoulder ER strength to at least 4+/5 to promote ability to raise her L arm with less pain.    Baseline current ER strength: 4/5   Time 2   Period Weeks    Status On-going               Plan - 02/06/16 1112    Clinical Impression Statement Pt demonstrates overall improved function based on her Quick Dash compared to initial evaluation. Pt tolerated session well without aggravation of symptoms.  Rehab Potential Fair   Clinical Impairments Affecting Rehab Potential (-): age, multiple comorbidities, pain, weakness. (+): motivation   PT Frequency 2x / week   PT Duration 6 weeks   PT Treatment/Interventions Therapeutic exercise;Manual techniques;Therapeutic activities;Neuromuscular re-education;Patient/family education;Aquatic Therapy   PT Next Visit Plan ROM, manual therapy, gentle strengthening, posture   Consulted and Agree with Plan of Care Patient      Patient will benefit from skilled therapeutic intervention in order to improve the following deficits and impairments:  Pain, Hypomobility, Decreased strength, Postural dysfunction, Decreased range of motion, Improper body mechanics  Visit Diagnosis: Pain in left shoulder  Muscle weakness (generalized)  Stiffness of left shoulder, not elsewhere classified     Problem List Patient Active Problem List   Diagnosis Date Noted  . Osteoarthritis of left shoulder 12/22/2015  . Paresthesia and pain of right extremity 06/19/2015  . Diverticulosis 02/17/2015  . Adaptation reaction 02/16/2015  . At risk for falling 02/16/2015  . Benign hypertension with CKD (chronic kidney disease) stage III 02/16/2015  . Adult BMI 30+ 02/16/2015  . Chronic kidney disease (CKD), stage III (moderate) 02/16/2015  . CN (constipation) 02/16/2015  . Diabetes ([place]) 02/16/2015  . Accumulation of fluid in tissues 02/16/2015  . H/O gastric ulcer 02/16/2015  . Hypercholesteremia 02/16/2015  . Insomnia 02/16/2015  . Gonalgia 02/16/2015  . Cramps of lower extremity 02/16/2015  . Fungal infection of toenail 02/16/2015  . Pain in shoulder 02/16/2015  . Arthritis 11/22/2014  . Helicobacter pylori  gastrointestinal tract infection 05/18/2014  . Anemia, iron deficiency 03/27/2014  . Bilateral lower extremity edema 02/22/2014  . Breast cancer ([place]) 03/04/2013    Joneen Boers PT, DPT   02/06/2016, 12:49 PM  [place] [facility] [address] [place], [address] Phone: [phone]   Fax:  [phone]  Name: Robin Ashley MRN: [id number] Date of Birth: [birth year]

## 2016-02-08 ENCOUNTER — Ambulatory Visit: Payer: Medicare Other

## 2016-02-08 DIAGNOSIS — M25612 Stiffness of left shoulder, not elsewhere classified: Secondary | ICD-10-CM

## 2016-02-08 DIAGNOSIS — M6281 Muscle weakness (generalized): Secondary | ICD-10-CM | POA: Diagnosis not present

## 2016-02-08 DIAGNOSIS — M25512 Pain in left shoulder: Secondary | ICD-10-CM

## 2016-02-08 NOTE — Therapy (Signed)
[facility] [address] [place], [address] Phone: [phone]   Fax:  [phone]  Physical Therapy Treatment And Discharge Summary  Patient Details  Name: Robin Ashley MRN: [id number] Date of Birth: [birth year] Referring Provider: [name], PA-C  Encounter Date: 02/08/2016      PT End of Session - 02/08/16 1035    Visit Number 13   Number of Visits 13   Date for PT Re-Evaluation 02/08/16   Authorization Type 5   Authorization Time Period of 10   PT Start Time 1035   PT Stop Time 1104   PT Time Calculation (min) 29 min   Activity Tolerance Patient tolerated treatment well   Behavior During Therapy [facility] for tasks assessed/performed      Past Medical History:  Diagnosis Date  . 174.06 March 2013   Right breast, T1c, N0, 12 mm; ER PR positive, HER-2/neu not over expressed. Not a candidate for adjuvant chemotherapy per [facility] tumor board.  . Diabetes mellitus without complication ([place])   . Foot drop, right October 2014   noted post mastectomy, conservative treatment was instituted with resolution, mild edema.  . Hyperlipidemia   . Hypertension     Past Surgical History:  Procedure Laterality Date  . BREAST BIOPSY Left 02/25/13   positive  . BREAST SURGERY Left 03-15-13   left mastectomywith SN biopsy  . COLON SURGERY  1991   Likely segmental resection for diverticulitis based on patient description.  . COLONOSCOPY    . EYE SURGERY Bilateral 04/2012  . intestinal balloon removed  1991   Likely surgery for diverticulitis.  Marland Kitchen MASTECTOMY Left    positive  . 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    There were no vitals filed for this visit.      Subjective Assessment - 02/08/16 1037    Subjective L shoulder is pretty good. No pain at rest. No L shoulder pain when raising her L arm up currently. Just tingling in her fingers  (not the thumb).  L shoulder did not bother her when she crocheted or tatted.  Feels like today is a good day for her graduation from PT.    Pertinent History L shoulder pain. 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 All of a sudden, she got pain in her L shoulder about a couple of months ago which is getting a little worse.  Had an x-ray for her L shoulder which revealed arthritis.  Pt is R hand dominant.    Patient Stated Goals "No more pain."   Currently in Pain? No/denies   Pain Onset More than a month ago                            Objectives   There-ex  Directed patient with standing L shoulder extension resisting yellow theraband 10x3. Good activation of L rhomboid muscles to decrease L rotation of upper thoracic spine. No L hand tingling with shoulder flexion afterwards.  Bilateral shoulder ER resisting yellow band 10x3. Reviewed and given as part of her HEP. Pt demonstrated and verbalized understanding.   Seated L shoulder flexion AAROM using PVC pipe 6x5 seconds, for 4 sets  Then L shoulder scaption AAROM using PVC pipe 5 x 5 seconds for 3 sets    Reviewed and given as part of her HEP. Pt demonstrated and verbalized  understanding.   Reviewed progress/current status with L shoulder function, flexion AROM, and ER strength with pt.   Seated chin tucks 10x5 seconds for 2 sets to promote upper thoracic extension and posterior scapular tipping.     Improved exercise technique, movement at target joints, use of target muscles after min to mod verbal, visual, tactile cues.       Pt has demonstrated improved L shoulder function, ER strength, and flexion AROM with decreased pain since initial evaluation. Pt has progressed very well with physical therapy towards goals. Skilled physical therapy services discharged with patient continuing progress with her HEP.           PT Education - 02/08/16 1043    Education provided Yes   Education Details  ther-ex, HEP   Person(s) Educated Patient   Methods Explanation;Demonstration;Tactile cues;Verbal cues   Comprehension Returned demonstration;Verbalized understanding             PT Long Term Goals - 02/06/16 1234      PT LONG TERM GOAL #1   Title Patient will have a decrease in L shoulder pain to 4/10 or less at worst to promote ability to raise her L arm, reach, and perform activities such as crocheting.    Baseline 7/10 L shoulder pain at worst. 4/10 L shoulder pain at most for the past 7 days (01/25/2016)   Time 6   Period Weeks   Status Achieved     PT LONG TERM GOAL #2   Title Patient will improve L shoulder ER strength by at least 1/2 MMT grade to promote ability to raise her L arm with less pain.    Baseline 4-/5 L shoulder ER; 4/5 L shoulder ER (01/25/2016)   Time 6   Period Weeks   Status Achieved     PT LONG TERM GOAL #3   Title Patient will improve her Quick Dash Disability/Symptom score by at least 10 % as a demonstration of improved function.    Baseline 34% initial and current score (01/25/2016); current score: 11.4% (02/06/2016)   Time 6   Period Weeks   Status Achieved     PT LONG TERM GOAL #4   Title Patient will improve L shoulder flexion AROM to at least 100 degrees to promote ability to raise her arm.    Baseline L shoulder flexion AROM 70 degrees; 84 degrees L shoulder flexion AROM (01/25/2016); 86 degrees L shoulder flexion AROM (02/06/2016)   Time 6   Period Weeks   Status On-going     PT LONG TERM GOAL #5   Title Patient will improve L shoulder ER strength to at least 4+/5 to promote ability to raise her L arm with less pain.    Baseline current ER strength: 4/5   Time 2   Period Weeks   Status On-going               Plan - 02/08/16 1043    Clinical Impression Statement Pt has demonstrated improved L shoulder function, ER strength, and flexion AROM with decreased pain since initial evaluation. Pt has progressed very well with physical  therapy towards goals. Skilled physical therapy services discharged with patient continuing progress with her HEP.    Rehab Potential Fair   Clinical Impairments Affecting Rehab Potential (-): age, multiple comorbidities, pain, weakness. (+): motivation   PT Frequency --   PT Duration --   PT Treatment/Interventions Therapeutic exercise;Manual techniques;Therapeutic activities;Neuromuscular re-education;Patient/family education   PT Next Visit Plan Continue progress with  her HEP   Consulted and Agree with Plan of Care Patient      Patient will benefit from skilled therapeutic intervention in order to improve the following deficits and impairments:  Pain, Hypomobility, Decreased strength, Postural dysfunction, Decreased range of motion, Improper body mechanics  Visit Diagnosis: Pain in left shoulder  Muscle weakness (generalized)  Stiffness of left shoulder, not elsewhere classified       G-Codes - 02/23/16 1132    Functional Assessment Tool Used QuickDash Disability Symptom Score, clinical presentation, patient interview   Functional Limitation Carrying, moving and handling objects   Carrying, Moving and Handling Objects Goal Status (Q6761) At least 1 percent but less than 20 percent impaired, limited or restricted   Carrying, Moving and Handling Objects Discharge Status [phone]) At least 1 percent but less than 20 percent impaired, limited or restricted      Problem List Patient Active Problem List   Diagnosis Date Noted  . Osteoarthritis of left shoulder 12/22/2015  . Paresthesia and pain of right extremity 06/19/2015  . Diverticulosis 02/17/2015  . Adaptation reaction 02/16/2015  . At risk for falling 02/16/2015  . Benign hypertension with CKD (chronic kidney disease) stage III 02/16/2015  . Adult BMI 30+ 02/16/2015  . Chronic kidney disease (CKD), stage III (moderate) 02/16/2015  . CN (constipation) 02/16/2015  . Diabetes ([place]) 02/16/2015  . Accumulation of fluid in  tissues 02/16/2015  . H/O gastric ulcer 02/16/2015  . Hypercholesteremia 02/16/2015  . Insomnia 02/16/2015  . Gonalgia 02/16/2015  . Cramps of lower extremity 02/16/2015  . Fungal infection of toenail 02/16/2015  . Pain in shoulder 02/16/2015  . Arthritis 11/22/2014  . Helicobacter pylori gastrointestinal tract infection 05/18/2014  . Anemia, iron deficiency 03/27/2014  . Bilateral lower extremity edema 02/22/2014  . Breast cancer ([place]) 03/04/2013   Thank you for your referral.   Joneen Boers PT, DPT   23-Feb-2016, 11:34 AM  [facility] [address], [place], [address] Phone: [phone]   Fax:  [phone]  Name: Robin Ashley MRN: [id number] Date of Birth: 1932/02/08

## 2016-02-12 ENCOUNTER — Other Ambulatory Visit: Payer: Self-pay | Admitting: General Surgery

## 2016-02-12 ENCOUNTER — Ambulatory Visit
Admission: RE | Admit: 2016-02-12 | Discharge: 2016-02-12 | Disposition: A | Discharge status: Home / Self Care | Payer: Medicare Other | Source: Ambulatory Visit | Attending: General Surgery | Admitting: General Surgery

## 2016-02-12 DIAGNOSIS — Z1231 Encounter for screening mammogram for malignant neoplasm of breast: Secondary | ICD-10-CM | POA: Insufficient documentation

## 2016-02-13 ENCOUNTER — Encounter: Payer: Self-pay | Admitting: *Deleted

## 2016-02-16 DIAGNOSIS — E782 Mixed hyperlipidemia: Secondary | ICD-10-CM | POA: Diagnosis not present

## 2016-02-16 DIAGNOSIS — G4733 Obstructive sleep apnea (adult) (pediatric): Secondary | ICD-10-CM | POA: Diagnosis not present

## 2016-02-16 DIAGNOSIS — I10 Essential (primary) hypertension: Secondary | ICD-10-CM | POA: Diagnosis not present

## 2016-02-19 ENCOUNTER — Ambulatory Visit (INDEPENDENT_AMBULATORY_CARE_PROVIDER_SITE_OTHER): Payer: Medicare Other | Admitting: General Surgery

## 2016-02-19 ENCOUNTER — Encounter: Payer: Self-pay | Admitting: General Surgery

## 2016-02-19 VITALS — BP 150/80 | HR 92 | Resp 15 | Ht 67.0 in | Wt 225.0 lb

## 2016-02-19 DIAGNOSIS — C50012 Malignant neoplasm of nipple and areola, left female breast: Secondary | ICD-10-CM | POA: Diagnosis not present

## 2016-02-19 NOTE — Progress Notes (Deleted)
Patient ID: Robin Ashley, female   DOB: 10/16/1931, 82 y.o.   MRN: [id number]  Chief Complaint  Patient presents with  . Follow-up    mammogram    HPI Robin Ashley is a 82 y.o. female who presents for a breast evaluation. The most recent mammogram was done on 02/12/16. Patient does perform regular self breast checks and gets regular mammograms done.     HPI  Past Medical History:  Diagnosis Date  . 174.06 March 2013   Right breast, T1c, N0, 12 mm; ER PR positive, HER-2/neu not over expressed. Not a candidate for adjuvant chemotherapy per [facility] tumor board.  . Diabetes mellitus without complication ([employer])   . Foot drop, right October 2014   noted post mastectomy, conservative treatment was instituted with resolution, mild edema.  . Hyperlipidemia   . Hypertension     Past Surgical History:  Procedure Laterality Date  . BREAST BIOPSY Left 02/25/13   positive  . BREAST SURGERY Left 03-15-13   left mastectomywith SN biopsy  . COLON SURGERY  1991   Likely segmental resection for diverticulitis based on patient description.  . COLONOSCOPY    . EYE SURGERY Bilateral 04/2012  . intestinal balloon removed  1991   Likely surgery for diverticulitis.  Marland Kitchen MASTECTOMY Left 2014   positive  . 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    Family History  Problem Relation Age of Onset  . Cancer Brother     colon  . Diabetes Brother   . Heart disease Brother   . Diabetes Brother   . Breast cancer Neg Hx     Social History Social History  Substance Use Topics  . Smoking status: Never Smoker  . Smokeless tobacco: Never Used  . Alcohol use No    No Known Allergies  Current Outpatient Prescriptions  Medication Sig Dispense Refill  . amLODipine (NORVASC) 5 MG tablet     . aspirin 81 MG tablet Take 1 tablet (81 mg total) by mouth daily. 30 tablet 0  . atorvastatin (LIPITOR) 80 MG tablet TAKE 1 TABLET DAILY 90 tablet 3  .  ferrous sulfate ([employer] IRON) 325 (65 FE) MG tablet Take 1 tablet (325 mg total) by mouth 2 (two) times daily. 180 tablet 1  . furosemide (LASIX) 20 MG tablet TAKE 1 TABLET DAILY 90 tablet 1  . letrozole (FEMARA) 2.5 MG tablet Take 1 tablet (2.5 mg total) by mouth daily. 90 tablet 4  . lisinopril (PRINIVIL,ZESTRIL) 20 MG tablet     . meloxicam (MOBIC) 15 MG tablet TAKE 1 TABLET DAILY 90 tablet 1  . metFORMIN (GLUCOPHAGE) 500 MG tablet TAKE 1 TABLET TWICE A DAY 180 tablet 3  . Multiple Vitamins-Minerals (CENTRUM SILVER PO) Take 1 tablet by mouth daily.    Marland Kitchen omeprazole (PRILOSEC) 20 MG capsule TAKE 1 CAPSULE DAILY 90 capsule 4  . traZODone (DESYREL) 50 MG tablet TAKE 1 TABLET EVERY EVENING 90 tablet 2  . polyethylene glycol powder (GLYCOLAX/MIRALAX) powder   0   No current facility-administered medications for this visit.     Review of Systems Review of Systems  Constitutional: Negative.   Respiratory: Negative.   Cardiovascular: Negative.     Blood pressure (!) 150/80, pulse 92, resp. rate 15, height 5' 7" (1.702 m), weight 225 lb (102.1 kg).  Physical Exam Physical Exam  Constitutional: She is oriented to person, place, and time. She appears well-developed and well-nourished.  Eyes:  Conjunctivae are normal. No scleral icterus.  Neck: Neck supple.  Cardiovascular: Normal rate, regular rhythm and normal heart sounds.   Pulmonary/Chest: Effort normal and breath sounds normal. Right breast exhibits no inverted nipple, no mass, no nipple discharge, no skin change and no tenderness.  Lymphadenopathy:    She has no cervical adenopathy.  Neurological: She is alert and oriented to person, place, and time.  Skin: Skin is warm and dry.  Psychiatric: She has a normal mood and affect.    Data Reviewed ***  Assessment    ***    Plan    ***    This has been scribed by [place] LPN    Merrilyn Puma, [place] 02/19/2016, 10:24 AM

## 2016-02-19 NOTE — Patient Instructions (Addendum)
  Return in six months office visit.

## 2016-02-19 NOTE — Progress Notes (Signed)
Patient ID: Robin Ashley, female   DOB: [birth year], 82 y.o.   MRN: [id number]  Chief Complaint  Patient presents with  . Follow-up    mammogram    HPI Robin Ashley is a 82 y.o. female who presents for a breast evaluation. The most recent mammogram was done on 02/12/16.  Patient does perform regular self breast checks and gets regular mammograms done.    HPI  Past Medical History:  Diagnosis Date  . 174.06 March 2013   Right breast, T1c, N0, 12 mm; ER PR positive, HER-2/neu not over expressed. Not a candidate for adjuvant chemotherapy per [facility] tumor board.  . Diabetes mellitus without complication ([place])   . Foot drop, right October 2014   noted post mastectomy, conservative treatment was instituted with resolution, mild edema.  . Hyperlipidemia   . Hypertension     Past Surgical History:  Procedure Laterality Date  . BREAST BIOPSY Left 02/25/13   positive  . BREAST SURGERY Left 03-15-13   left mastectomywith SN biopsy  . COLON SURGERY  1991   Likely segmental resection for diverticulitis based on patient description.  . COLONOSCOPY    . EYE SURGERY Bilateral 04/2012  . intestinal balloon removed  1991   Likely surgery for diverticulitis.  Marland Kitchen MASTECTOMY Left 2014   positive  . 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    Family History  Problem Relation Age of Onset  . Cancer Brother     colon  . Diabetes Brother   . Heart disease Brother   . Diabetes Brother   . Breast cancer Neg Hx     Social History Social History  Substance Use Topics  . Smoking status: Never Smoker  . Smokeless tobacco: Never Used  . Alcohol use No    No Known Allergies  Current Outpatient Prescriptions  Medication Sig Dispense Refill  . amLODipine (NORVASC) 5 MG tablet     . aspirin 81 MG tablet Take 1 tablet (81 mg total) by mouth daily. 30 tablet 0  . atorvastatin (LIPITOR) 80 MG tablet TAKE 1 TABLET DAILY 90 tablet 3  .  ferrous sulfate ([employer]S IRON) 325 (65 FE) MG tablet Take 1 tablet (325 mg total) by mouth 2 (two) times daily. 180 tablet 1  . furosemide (LASIX) 20 MG tablet TAKE 1 TABLET DAILY 90 tablet 1  . letrozole (FEMARA) 2.5 MG tablet Take 1 tablet (2.5 mg total) by mouth daily. 90 tablet 4  . lisinopril (PRINIVIL,ZESTRIL) 20 MG tablet     . meloxicam (MOBIC) 15 MG tablet TAKE 1 TABLET DAILY 90 tablet 1  . metFORMIN (GLUCOPHAGE) 500 MG tablet TAKE 1 TABLET TWICE A DAY 180 tablet 3  . Multiple Vitamins-Minerals (CENTRUM SILVER PO) Take 1 tablet by mouth daily.    Marland Kitchen omeprazole (PRILOSEC) 20 MG capsule TAKE 1 CAPSULE DAILY 90 capsule 4  . traZODone (DESYREL) 50 MG tablet TAKE 1 TABLET EVERY EVENING 90 tablet 2  . polyethylene glycol powder (GLYCOLAX/MIRALAX) powder   0   No current facility-administered medications for this visit.     Review of Systems Review of Systems  Constitutional: Negative.   Respiratory: Negative.   Cardiovascular: Negative.     Blood pressure (!) 150/80, pulse 92, resp. rate 15, height 5' 7"  (1.702 m), weight 225 lb (102.1 kg).  Physical Exam Physical Exam  Constitutional: She is oriented to person, place, and time. She appears well-developed and well-nourished.  Eyes:  Conjunctivae are normal. No scleral icterus.  Neck: Neck supple.  Cardiovascular: Normal rate, regular rhythm and normal heart sounds.   Pulmonary/Chest: Effort normal and breath sounds normal. Right breast exhibits no inverted nipple, no mass, no nipple discharge, no skin change and no tenderness. Left breast exhibits no inverted nipple, no mass, no nipple discharge, no skin change and no tenderness.    Abdominal: Soft. Bowel sounds are normal. There is no tenderness.  Musculoskeletal:       Left shoulder: She exhibits decreased range of motion and swelling.       Arms: Lymphadenopathy:    She has no cervical adenopathy.    She has no axillary adenopathy.  Neurological: She is alert and oriented to  person, place, and time.  Skin: Skin is warm and dry.    Data Reviewed The femoral 11, 2011 right breast mammogram reviewed. No interval change. BI-RADS 1.  Assessment    Benign breast exam.  Decreased shoulder range of motion, new.  Modest left upper extremity edema, likely related to disuse more so than lymphedema.    Plan    The patient has been followed by physical therapy dating back to late July with minimal improvement by her report.  She's been encouraged to have orthopedic assessment before she loses any more mobility.  She reports having seen Thornton Park, M.D. from [facility] orthopedics. Referral initiated for evaluation of her left shoulder.  Return in six months for office visit to reassess upper extremity swelling and range of motion..    This information has been scribed by Gaspar Cola CMA.'    Robert Bellow 02/19/2016, 9:51 PM

## 2016-02-20 ENCOUNTER — Other Ambulatory Visit: Payer: Self-pay | Admitting: Family Medicine

## 2016-02-20 DIAGNOSIS — R600 Localized edema: Secondary | ICD-10-CM

## 2016-02-27 ENCOUNTER — Telehealth: Payer: Self-pay

## 2016-02-27 NOTE — Telephone Encounter (Signed)
Notified patient of referral to Dr Thornton Park at [facility] on 03/07/16 at 1:45 pm. She is aware of date and time.

## 2016-03-06 ENCOUNTER — Ambulatory Visit (INDEPENDENT_AMBULATORY_CARE_PROVIDER_SITE_OTHER): Payer: Medicare Other | Admitting: Physician Assistant

## 2016-03-06 ENCOUNTER — Encounter: Payer: Self-pay | Admitting: Physician Assistant

## 2016-03-06 VITALS — BP 138/60 | HR 80 | Temp 98.0°F | Resp 16 | Wt 226.4 lb

## 2016-03-06 DIAGNOSIS — N183 Chronic kidney disease, stage 3 (moderate): Secondary | ICD-10-CM

## 2016-03-06 DIAGNOSIS — M19012 Primary osteoarthritis, left shoulder: Secondary | ICD-10-CM | POA: Diagnosis not present

## 2016-03-06 DIAGNOSIS — E1122 Type 2 diabetes mellitus with diabetic chronic kidney disease: Secondary | ICD-10-CM | POA: Diagnosis not present

## 2016-03-06 DIAGNOSIS — Z23 Encounter for immunization: Secondary | ICD-10-CM | POA: Diagnosis not present

## 2016-03-06 DIAGNOSIS — E7800 Pure hypercholesterolemia, unspecified: Secondary | ICD-10-CM

## 2016-03-06 DIAGNOSIS — I129 Hypertensive chronic kidney disease with stage 1 through stage 4 chronic kidney disease, or unspecified chronic kidney disease: Secondary | ICD-10-CM | POA: Diagnosis not present

## 2016-03-06 NOTE — Patient Instructions (Signed)
DASH Eating Plan  DASH stands for "Dietary Approaches to Stop Hypertension." The DASH eating plan is a healthy eating plan that has been shown to reduce high blood pressure (hypertension). Additional health benefits may include reducing the risk of type 2 diabetes mellitus, heart disease, and stroke. The DASH eating plan may also help with weight loss.  WHAT DO I NEED TO KNOW ABOUT THE DASH EATING PLAN?  For the DASH eating plan, you will follow these general guidelines:  · Choose foods with a percent daily value for sodium of less than 5% (as listed on the food label).  · Use salt-free seasonings or herbs instead of table salt or sea salt.  · Check with your health care provider or pharmacist before using salt substitutes.  · Eat lower-sodium products, often labeled as "lower sodium" or "no salt added."  · Eat fresh foods.  · Eat more vegetables, fruits, and low-fat dairy products.  · Choose whole grains. Look for the word "whole" as the first word in the ingredient list.  · Choose fish and skinless chicken or turkey more often than red meat. Limit fish, poultry, and meat to 6 oz (170 g) each day.  · Limit sweets, desserts, sugars, and sugary drinks.  · Choose heart-healthy fats.  · Limit cheese to 1 oz (28 g) per day.  · Eat more home-cooked food and less restaurant, buffet, and fast food.  · Limit fried foods.  · Cook foods using methods other than frying.  · Limit canned vegetables. If you do use them, rinse them well to decrease the sodium.  · When eating at a restaurant, ask that your food be prepared with less salt, or no salt if possible.  WHAT FOODS CAN I EAT?  Seek help from a dietitian for individual calorie needs.  Grains  Whole grain or whole wheat bread. Brown rice. Whole grain or whole wheat pasta. Quinoa, bulgur, and whole grain cereals. Low-sodium cereals. Corn or whole wheat flour tortillas. Whole grain cornbread. Whole grain crackers. Low-sodium crackers.  Vegetables  Fresh or frozen vegetables  (raw, steamed, roasted, or grilled). Low-sodium or reduced-sodium tomato and vegetable juices. Low-sodium or reduced-sodium tomato sauce and paste. Low-sodium or reduced-sodium canned vegetables.   Fruits  All fresh, canned (in natural juice), or frozen fruits.  Meat and Other Protein Products  Ground beef (85% or leaner), grass-fed beef, or beef trimmed of fat. Skinless chicken or turkey. Ground chicken or turkey. Pork trimmed of fat. All fish and seafood. Eggs. Dried beans, peas, or lentils. Unsalted nuts and seeds. Unsalted canned beans.  Dairy  Low-fat dairy products, such as skim or 1% milk, 2% or reduced-fat cheeses, low-fat ricotta or cottage cheese, or plain low-fat yogurt. Low-sodium or reduced-sodium cheeses.  Fats and Oils  Tub margarines without trans fats. Light or reduced-fat mayonnaise and salad dressings (reduced sodium). Avocado. Safflower, olive, or canola oils. Natural peanut or almond butter.  Other  Unsalted popcorn and pretzels.  The items listed above may not be a complete list of recommended foods or beverages. Contact your dietitian for more options.  WHAT FOODS ARE NOT RECOMMENDED?  Grains  White bread. White pasta. White rice. Refined cornbread. Bagels and croissants. Crackers that contain trans fat.  Vegetables  Creamed or fried vegetables. Vegetables in a cheese sauce. Regular canned vegetables. Regular canned tomato sauce and paste. Regular tomato and vegetable juices.  Fruits  Dried fruits. Canned fruit in light or heavy syrup. Fruit juice.  Meat and Other Protein   Products  Fatty cuts of meat. Ribs, chicken wings, bacon, sausage, bologna, salami, chitterlings, fatback, hot dogs, bratwurst, and packaged luncheon meats. Salted nuts and seeds. Canned beans with salt.  Dairy  Whole or 2% milk, cream, half-and-half, and cream cheese. Whole-fat or sweetened yogurt. Full-fat cheeses or blue cheese. Nondairy creamers and whipped toppings. Processed cheese, cheese spreads, or cheese  curds.  Condiments  Onion and garlic salt, seasoned salt, table salt, and sea salt. Canned and packaged gravies. Worcestershire sauce. Tartar sauce. Barbecue sauce. Teriyaki sauce. Soy sauce, including reduced sodium. Steak sauce. Fish sauce. Oyster sauce. Cocktail sauce. Horseradish. Ketchup and mustard. Meat flavorings and tenderizers. Bouillon cubes. Hot sauce. Tabasco sauce. Marinades. Taco seasonings. Relishes.  Fats and Oils  Butter, stick margarine, lard, shortening, ghee, and bacon fat. Coconut, palm kernel, or palm oils. Regular salad dressings.  Other  Pickles and olives. Salted popcorn and pretzels.  The items listed above may not be a complete list of foods and beverages to avoid. Contact your dietitian for more information.  WHERE CAN I FIND MORE INFORMATION?  National Heart, Lung, and Blood Institute: www.nhlbi.nih.gov/health/health-topics/topics/dash/     This information is not intended to replace advice given to you by your health care provider. Make sure you discuss any questions you have with your health care provider.     Document Released: 05/09/2011 Document Revised: 06/10/2014 Document Reviewed: 03/24/2013  Elsevier Interactive Patient Education ©2016 [employer]

## 2016-03-06 NOTE — Progress Notes (Signed)
Patient: Robin Ashley Female    DOB: 1932-01-30   82 y.o.   MRN: [id number] Visit Date: 03/06/2016  Today's Provider: Mar Daring, PA-C   Chief Complaint  Patient presents with  . Follow-up    Diabetes,Hypercholesteremia,Hypertension, Osteoarthritis   Subjective:    HPI  Diabetes Mellitus Type II, Follow-up:   Lab Results  Component Value Date   HGBA1C 6.4 10/16/2015   HGBA1C 6.2 06/19/2015   HGBA1C 6.2 02/17/2015   Last seen for diabetes 5 months ago.  Management since then includes none. She reports excellent compliance with treatment. She is not having side effects.  Current symptoms include none and have been stable. Weight trend: stable Current diet: in general, a "healthy" diet   Current exercise: none  ------------------------------------------------------------------------   Hypertension, follow-up:  BP Readings from Last 3 Encounters:  03/06/16 138/60  02/19/16 (!) 150/80  01/24/16 (!) 160/72    She was last seen for hypertension 5 months ago.  BP at that visit was 130/80. Management since that visit includes none.She reports excellent compliance with treatment. She is not having side effects.  She is adherent to low salt diet.   Outside blood pressures are n/a. She is experiencing none.  Patient denies chest pain, chest pressure/discomfort, exertional chest pressure/discomfort, fatigue, irregular heart beat, lower extremity edema and palpitations.   Cardiovascular risk factors include advanced age (older than 74 for men, 20 for women), diabetes mellitus, dyslipidemia and hypertension.   ------------------------------------------------------------------------    Lipid/Cholesterol, Follow-up:   Last seen for this 5 months ago.  Management since that visit includes none.  Last Lipid Panel:    Component Value Date/Time   CHOL 138 10/17/2015 0807   TRIG 154 (H) 10/17/2015 0807   HDL 43 10/17/2015 0807   CHOLHDL 3.2  10/17/2015 0807   LDLCALC 64 10/17/2015 0807    She reports excellent compliance with treatment. She is not having side effects.   Wt Readings from Last 3 Encounters:  03/06/16 226 lb 6.4 oz (102.7 kg)  02/19/16 225 lb (102.1 kg)  01/24/16 227 lb 3.2 oz (103.1 kg)    ------------------------------------------------------------------------ Ostearthritis of left shoulder: Patient is here for her 6 weeks follow-up. She was advised to continue with PT, and continue Meloxicam with food.She reports that she is following all the exercises and how to stretch that was given by the PT. She thinks she has one more appointment with the PT. She has noticed some improvement but still gets occasional pain ("feels like a charlie horse") at deltoid insertion of left arm. She does see Dr. Tanja Port on Thursday, 03/07/16.    No Known Allergies   Current Outpatient Prescriptions:  .  amLODipine (NORVASC) 5 MG tablet, , Disp: , Rfl:  .  aspirin 81 MG tablet, Take 1 tablet (81 mg total) by mouth daily., Disp: 30 tablet, Rfl: 0 .  atorvastatin (LIPITOR) 80 MG tablet, TAKE 1 TABLET DAILY, Disp: 90 tablet, Rfl: 3 .  ferrous sulfate ([employer] IRON) 325 (65 FE) MG tablet, Take 1 tablet (325 mg total) by mouth 2 (two) times daily., Disp: 180 tablet, Rfl: 1 .  furosemide (LASIX) 20 MG tablet, TAKE 1 TABLET DAILY, Disp: 90 tablet, Rfl: 1 .  letrozole (FEMARA) 2.5 MG tablet, Take 1 tablet (2.5 mg total) by mouth daily., Disp: 90 tablet, Rfl: 4 .  lisinopril (PRINIVIL,ZESTRIL) 20 MG tablet, , Disp: , Rfl:  .  meloxicam (MOBIC) 15 MG tablet, TAKE 1 TABLET DAILY, Disp:  90 tablet, Rfl: 1 .  metFORMIN (GLUCOPHAGE) 500 MG tablet, TAKE 1 TABLET TWICE A DAY, Disp: 180 tablet, Rfl: 3 .  Multiple Vitamins-Minerals (CENTRUM SILVER PO), Take 1 tablet by mouth daily., Disp: , Rfl:  .  omeprazole (PRILOSEC) 20 MG capsule, TAKE 1 CAPSULE DAILY, Disp: 90 capsule, Rfl: 4 .  polyethylene glycol powder (GLYCOLAX/MIRALAX) powder, , Disp: ,  Rfl: 0 .  traZODone (DESYREL) 50 MG tablet, TAKE 1 TABLET EVERY EVENING, Disp: 90 tablet, Rfl: 2  Review of Systems  Constitutional: Negative for fatigue.  HENT: Negative.   Respiratory: Negative.   Cardiovascular: Negative for chest pain, palpitations and leg swelling.  Gastrointestinal: Negative.   Genitourinary: Negative.   Neurological: Positive for light-headedness (sometimes). Negative for dizziness and headaches.    Social History  Substance Use Topics  . Smoking status: Never Smoker  . Smokeless tobacco: Never Used  . Alcohol use No   Objective:   BP 138/60 (BP Location: Right Arm, Patient Position: Sitting, Cuff Size: Normal)   Pulse 80   Temp 98 F (36.7 C) (Oral)   Resp 16   Wt 226 lb 6.4 oz (102.7 kg)   BMI 35.46 kg/m   Physical Exam  Constitutional: She appears well-developed and well-nourished. No distress.  Neck: Normal range of motion. Neck supple. No JVD present. No tracheal deviation present. No thyromegaly present.  Cardiovascular: Normal rate, regular rhythm and normal heart sounds.  Exam reveals no gallop and no friction rub.   No murmur heard. Pulmonary/Chest: Effort normal and breath sounds normal. No respiratory distress. She has no wheezes. She has no rales.  Musculoskeletal: She exhibits no edema.  Lymphadenopathy:    She has no cervical adenopathy.  Skin: She is not diaphoretic.  Vitals reviewed.     Assessment & Plan:     1. Benign hypertension with CKD (chronic kidney disease) stage III Stable. Continue current medical treatment plan with amlodipine 5mg , lisinopril 20mg . Will check labs as below and f/u pending results. - CBC with Differential/Platelet - Comprehensive metabolic panel - Lipid panel  2. Type 2 diabetes mellitus with diabetic chronic kidney disease, unspecified CKD stage, unspecified long term insulin use status (HCC) Stable. Continue current medical treatment plan with metformin 500mg  BID. Will check labs as below and f/u  pending results. - Lipid panel - Hemoglobin A1c  3. Hypercholesteremia Stable. Continue atorvastatin 80mg . Will check labs as below and f/u pending results. - Comprehensive metabolic panel - Lipid panel  4. Osteoarthritis of left shoulder, unspecified osteoarthritis type Completed PT and can now raise shoulder to 90 degree abduction without issue. Will see Dr. Mack Guise on Thursday, 03/07/16.  5. Influenza vaccine needed Flu vaccine given today without complication. Patient sat upright for 15 minutes to check for adverse reaction before being released. - Flu vaccine HIGH DOSE PF       Mar Daring, PA-C  [facility]

## 2016-03-07 ENCOUNTER — Telehealth: Payer: Self-pay

## 2016-03-07 DIAGNOSIS — M19019 Primary osteoarthritis, unspecified shoulder: Secondary | ICD-10-CM | POA: Diagnosis not present

## 2016-03-07 LAB — CBC WITH DIFFERENTIAL/PLATELET
BASOS ABS: 0.1 10*3/uL (ref 0.0–0.2)
BASOS: 0 %
EOS (ABSOLUTE): 0.4 10*3/uL (ref 0.0–0.4)
EOS: 3 %
HEMOGLOBIN: 11.8 g/dL (ref 11.1–15.9)
Hematocrit: 37 % (ref 34.0–46.6)
Immature Grans (Abs): 0 10*3/uL (ref 0.0–0.1)
Immature Granulocytes: 0 %
LYMPHS: 26 %
Lymphocytes Absolute: 3.1 10*3/uL (ref 0.7–3.1)
MCH: 30.3 pg (ref 26.6–33.0)
MCHC: 31.9 g/dL (ref 31.5–35.7)
MCV: 95 fL (ref 79–97)
MONOCYTES: 9 %
MONOS ABS: 1.1 10*3/uL — AB (ref 0.1–0.9)
NEUTROS ABS: 7.2 10*3/uL — AB (ref 1.4–7.0)
Neutrophils: 62 %
Platelets: 320 10*3/uL (ref 150–379)
RBC: 3.89 x10E6/uL (ref 3.77–5.28)
RDW: 14.3 % (ref 12.3–15.4)
WBC: 11.8 10*3/uL — ABNORMAL HIGH (ref 3.4–10.8)

## 2016-03-07 LAB — COMPREHENSIVE METABOLIC PANEL
ALBUMIN: 4.2 g/dL (ref 3.5–4.7)
ALK PHOS: 125 IU/L — AB (ref 39–117)
ALT: 15 IU/L (ref 0–32)
AST: 21 IU/L (ref 0–40)
Albumin/Globulin Ratio: 1.5 (ref 1.2–2.2)
BILIRUBIN TOTAL: 0.3 mg/dL (ref 0.0–1.2)
BUN / CREAT RATIO: 8 — AB (ref 12–28)
BUN: 10 mg/dL (ref 8–27)
CHLORIDE: 103 mmol/L (ref 96–106)
CO2: 20 mmol/L (ref 18–29)
Calcium: 9.2 mg/dL (ref 8.7–10.3)
Creatinine, Ser: 1.22 mg/dL — ABNORMAL HIGH (ref 0.57–1.00)
GFR calc Af Amer: 48 mL/min/{1.73_m2} — ABNORMAL LOW (ref >59)
GFR calc non Af Amer: 41 mL/min/{1.73_m2} — ABNORMAL LOW (ref >59)
GLOBULIN, TOTAL: 2.8 g/dL (ref 1.5–4.5)
Glucose: 169 mg/dL — ABNORMAL HIGH (ref 65–99)
POTASSIUM: 4 mmol/L (ref 3.5–5.2)
SODIUM: 143 mmol/L (ref 134–144)
Total Protein: 7 g/dL (ref 6.0–8.5)

## 2016-03-07 LAB — LIPID PANEL
CHOLESTEROL TOTAL: 158 mg/dL (ref 100–199)
Chol/HDL Ratio: 3.4 ratio units (ref 0.0–4.4)
HDL: 47 mg/dL (ref >39)
LDL Calculated: 77 mg/dL (ref 0–99)
TRIGLYCERIDES: 170 mg/dL — AB (ref 0–149)
VLDL Cholesterol Cal: 34 mg/dL (ref 5–40)

## 2016-03-07 LAB — HEMOGLOBIN A1C
ESTIMATED AVERAGE GLUCOSE: 140 mg/dL
Hgb A1c MFr Bld: 6.5 % — ABNORMAL HIGH (ref 4.8–5.6)

## 2016-03-07 NOTE — Telephone Encounter (Signed)
-----   Message from Mar Daring, PA-C sent at 03/07/2016  9:05 AM EDT ----- Labs are all fairly stable. WBC count is slightly elevated as is kidney function. Make sure to stay well hydrated. We can recheck in 2-4 weeks. Cholesterol is stable. HgBA1c increased slightly to 6.5 from 6.4. Continue medications and keep up with healthy lifestyle modifications including limiting carbohydrates and sugars.

## 2016-03-07 NOTE — Telephone Encounter (Signed)
Advised pt of lab results. Pt verbally acknowledges understanding. Emily Drozdowski, CMA   

## 2016-03-07 NOTE — Telephone Encounter (Signed)
Pt is returning call.  CB#[phone]/MW

## 2016-03-07 NOTE — Telephone Encounter (Signed)
[email or web address] Haizel Gatchell Drozdowski, CMA  

## 2016-03-18 ENCOUNTER — Ambulatory Visit: Payer: Medicare Other | Admitting: Physician Assistant

## 2016-04-02 ENCOUNTER — Other Ambulatory Visit: Payer: Self-pay | Admitting: Physician Assistant

## 2016-04-02 DIAGNOSIS — Z1231 Encounter for screening mammogram for malignant neoplasm of breast: Secondary | ICD-10-CM

## 2016-04-23 ENCOUNTER — Other Ambulatory Visit: Payer: Self-pay | Admitting: Family Medicine

## 2016-04-23 DIAGNOSIS — G4700 Insomnia, unspecified: Secondary | ICD-10-CM

## 2016-04-23 NOTE — Telephone Encounter (Signed)
[  please review-aa 

## 2016-04-29 ENCOUNTER — Other Ambulatory Visit: Payer: Self-pay | Admitting: Family Medicine

## 2016-04-29 DIAGNOSIS — C50912 Malignant neoplasm of unspecified site of left female breast: Secondary | ICD-10-CM

## 2016-05-08 ENCOUNTER — Ambulatory Visit
Admission: RE | Admit: 2016-05-08 | Discharge: 2016-05-08 | Disposition: A | Discharge status: Home / Self Care | Payer: Medicare Other | Source: Ambulatory Visit | Attending: Physician Assistant | Admitting: Physician Assistant

## 2016-05-08 DIAGNOSIS — Z1231 Encounter for screening mammogram for malignant neoplasm of breast: Secondary | ICD-10-CM

## 2016-06-18 ENCOUNTER — Encounter: Payer: Self-pay | Admitting: Physician Assistant

## 2016-06-18 ENCOUNTER — Ambulatory Visit (INDEPENDENT_AMBULATORY_CARE_PROVIDER_SITE_OTHER): Payer: Medicare Other | Admitting: Physician Assistant

## 2016-06-18 VITALS — BP 142/80 | HR 94 | Temp 97.4°F | Resp 16

## 2016-06-18 DIAGNOSIS — N183 Chronic kidney disease, stage 3 unspecified: Secondary | ICD-10-CM

## 2016-06-18 DIAGNOSIS — E1122 Type 2 diabetes mellitus with diabetic chronic kidney disease: Secondary | ICD-10-CM

## 2016-06-18 DIAGNOSIS — E7800 Pure hypercholesterolemia, unspecified: Secondary | ICD-10-CM

## 2016-06-18 DIAGNOSIS — I129 Hypertensive chronic kidney disease with stage 1 through stage 4 chronic kidney disease, or unspecified chronic kidney disease: Secondary | ICD-10-CM

## 2016-06-18 LAB — POCT GLYCOSYLATED HEMOGLOBIN (HGB A1C)
Est. average glucose Bld gHb Est-mCnc: 148
Hemoglobin A1C: 6.8

## 2016-06-18 NOTE — Patient Instructions (Signed)

## 2016-06-18 NOTE — Progress Notes (Signed)
Patient: Robin Ashley Female    DOB: [birth year]   83 y.o.   MRN: [id number] Visit Date: 06/18/2016  Today's Provider: Mar Daring, PA-C   Chief Complaint  Patient presents with  . Follow-up    HTN,Diabetes, Hypercholesteremia   Subjective:    HPI  Diabetes Mellitus Type II, Follow-up:   Lab Results  Component Value Date   HGBA1C 6.8 06/18/2016   HGBA1C 6.5 (H) 03/06/2016   HGBA1C 6.4 10/16/2015   Last seen for diabetes 3 months ago.  Management since then includes none. She reports excellent compliance with treatment. She is not having side effects.  Current symptoms include none and have been stable.     ------------------------------------------------------------------------   Hypertension with CKD, follow-up:  BP Readings from Last 3 Encounters:  06/18/16 (!) 142/80  03/06/16 138/60  02/19/16 (!) 150/80    She was last seen for hypertension 3 months ago.  BP at that visit was 138/60. Management since that visit includes none.She reports excellent compliance with treatment. She is not having side effects.  She is not exercising. She is adherent to low salt diet.   She is experiencing none.  Patient denies chest pain, chest pressure/discomfort, exertional chest pressure/discomfort, fatigue, irregular heart beat, lower extremity edema and palpitations.   Cardiovascular risk factors include advanced age (older than 39 for men, 106 for women), diabetes mellitus, dyslipidemia and hypertension.   ------------------------------------------------------------------------    Lipid/Cholesterol, Follow-up:   Last seen for this 3 months ago.  Management since that visit includes none.  Last Lipid Panel:    Component Value Date/Time   CHOL 158 03/06/2016 0925   TRIG 170 (H) 03/06/2016 0925   HDL 47 03/06/2016 0925   CHOLHDL 3.4 03/06/2016 0925   LDLCALC 77 03/06/2016 0925    She reports excellent compliance with treatment. She is not  having side effects.   Wt Readings from Last 3 Encounters:  03/06/16 226 lb 6.4 oz (102.7 kg)  02/19/16 225 lb (102.1 kg)  01/24/16 227 lb 3.2 oz (103.1 kg)    ------------------------------------------------------------------------     No Known Allergies   Current Outpatient Prescriptions:  .  amLODipine (NORVASC) 5 MG tablet, , Disp: , Rfl:  .  aspirin 81 MG tablet, Take 1 tablet (81 mg total) by mouth daily., Disp: 30 tablet, Rfl: 0 .  atorvastatin (LIPITOR) 80 MG tablet, TAKE 1 TABLET DAILY, Disp: 90 tablet, Rfl: 3 .  ferrous sulfate ([employer] IRON) 325 (65 FE) MG tablet, Take 1 tablet (325 mg total) by mouth 2 (two) times daily., Disp: 180 tablet, Rfl: 1 .  furosemide (LASIX) 20 MG tablet, TAKE 1 TABLET DAILY, Disp: 90 tablet, Rfl: 1 .  letrozole (FEMARA) 2.5 MG tablet, TAKE 1 TABLET DAILY, Disp: 90 tablet, Rfl: 1 .  lisinopril (PRINIVIL,ZESTRIL) 20 MG tablet, , Disp: , Rfl:  .  meloxicam (MOBIC) 15 MG tablet, TAKE 1 TABLET DAILY, Disp: 90 tablet, Rfl: 1 .  metFORMIN (GLUCOPHAGE) 500 MG tablet, TAKE 1 TABLET TWICE A DAY, Disp: 180 tablet, Rfl: 3 .  Multiple Vitamins-Minerals (CENTRUM SILVER PO), Take 1 tablet by mouth daily., Disp: , Rfl:  .  omeprazole (PRILOSEC) 20 MG capsule, TAKE 1 CAPSULE DAILY, Disp: 90 capsule, Rfl: 4 .  polyethylene glycol powder (GLYCOLAX/MIRALAX) powder, , Disp: , Rfl: 0 .  traZODone (DESYREL) 50 MG tablet, TAKE 1 TABLET EVERY EVENING, Disp: 90 tablet, Rfl: 2  Review of Systems  Constitutional: Negative.   Respiratory: Negative.  Cardiovascular: Negative.   Gastrointestinal: Negative.   Neurological: Negative.   Psychiatric/Behavioral: Negative.     Social History  Substance Use Topics  . Smoking status: Never Smoker  . Smokeless tobacco: Never Used  . Alcohol use No   Objective:   BP (!) 142/80 (BP Location: Right Arm, Patient Position: Sitting, Cuff Size: Large)   Pulse 94   Temp 97.4 F (36.3 C) (Oral)   Resp 16   Physical Exam    Constitutional: She appears well-developed and well-nourished. No distress.  Neck: Normal range of motion. Neck supple. No tracheal deviation present. No thyromegaly present.  Cardiovascular: Normal rate, regular rhythm and normal heart sounds.  Exam reveals no gallop and no friction rub.   No murmur heard. Pulmonary/Chest: Effort normal and breath sounds normal. No respiratory distress. She has no wheezes. She has no rales.  Lymphadenopathy:    She has no cervical adenopathy.  Skin: She is not diaphoretic.  Vitals reviewed.  Diabetic Foot Exam - Simple   Simple Foot Form Diabetic Foot exam was performed with the following findings:  Yes 06/18/2016  8:55 AM  Visual Inspection No deformities, no ulcerations, no other skin breakdown bilaterally:  Yes Sensation Testing Intact to touch and monofilament testing bilaterally:  Yes Pulse Check Posterior Tibialis and Dorsalis pulse intact bilaterally:  Yes Comments       Assessment & Plan:     1. Type 2 diabetes mellitus with diabetic chronic kidney disease, unspecified CKD stage, unspecified long term insulin use status (HCC) A1c increased again to 6.8. Will continue metformin 500mg  BID. Work on lifestyle modifications. I will see her back in 3 months for AWV and recheck of labs. - POCT glycosylated hemoglobin (Hb A1C)  2. Benign hypertension with CKD (chronic kidney disease) stage II Stable. Continue amlodipine 5mg  and lisinopril 20mg .   3. Hypercholesteremia Stable. Continue atorvastatin 80mg .       Mar Daring, PA-C  [facility]

## 2016-07-09 DIAGNOSIS — H43813 Vitreous degeneration, bilateral: Secondary | ICD-10-CM | POA: Diagnosis not present

## 2016-07-09 DIAGNOSIS — H524 Presbyopia: Secondary | ICD-10-CM | POA: Diagnosis not present

## 2016-07-09 DIAGNOSIS — I10 Essential (primary) hypertension: Secondary | ICD-10-CM | POA: Diagnosis not present

## 2016-07-09 DIAGNOSIS — E119 Type 2 diabetes mellitus without complications: Secondary | ICD-10-CM | POA: Diagnosis not present

## 2016-07-09 DIAGNOSIS — Z961 Presence of intraocular lens: Secondary | ICD-10-CM | POA: Diagnosis not present

## 2016-07-09 DIAGNOSIS — H52223 Regular astigmatism, bilateral: Secondary | ICD-10-CM | POA: Diagnosis not present

## 2016-07-09 DIAGNOSIS — H5203 Hypermetropia, bilateral: Secondary | ICD-10-CM | POA: Diagnosis not present

## 2016-07-09 LAB — HM DIABETES EYE EXAM

## 2016-07-30 ENCOUNTER — Encounter: Payer: Self-pay | Admitting: Physician Assistant

## 2016-08-06 ENCOUNTER — Encounter: Payer: Self-pay | Admitting: Physician Assistant

## 2016-08-14 DIAGNOSIS — I10 Essential (primary) hypertension: Secondary | ICD-10-CM | POA: Diagnosis not present

## 2016-08-14 DIAGNOSIS — E782 Mixed hyperlipidemia: Secondary | ICD-10-CM | POA: Diagnosis not present

## 2016-08-14 DIAGNOSIS — E119 Type 2 diabetes mellitus without complications: Secondary | ICD-10-CM | POA: Diagnosis not present

## 2016-08-14 DIAGNOSIS — G4733 Obstructive sleep apnea (adult) (pediatric): Secondary | ICD-10-CM | POA: Diagnosis not present

## 2016-08-18 ENCOUNTER — Other Ambulatory Visit: Payer: Self-pay | Admitting: Physician Assistant

## 2016-08-18 DIAGNOSIS — R600 Localized edema: Secondary | ICD-10-CM

## 2016-08-19 NOTE — Telephone Encounter (Signed)
Last ov  06/18/16 Last filled 02/20/16 Please review. Thank you. sd

## 2016-08-20 ENCOUNTER — Encounter: Payer: Self-pay | Admitting: General Surgery

## 2016-08-20 ENCOUNTER — Ambulatory Visit (INDEPENDENT_AMBULATORY_CARE_PROVIDER_SITE_OTHER): Payer: Medicare Other | Admitting: General Surgery

## 2016-08-20 VITALS — BP 152/80 | HR 92 | Resp 16 | Ht 67.0 in | Wt 221.0 lb

## 2016-08-20 DIAGNOSIS — C50012 Malignant neoplasm of nipple and areola, left female breast: Secondary | ICD-10-CM

## 2016-08-20 DIAGNOSIS — I890 Lymphedema, not elsewhere classified: Secondary | ICD-10-CM | POA: Diagnosis not present

## 2016-08-20 DIAGNOSIS — Z170 Estrogen receptor positive status [ER+]: Secondary | ICD-10-CM | POA: Diagnosis not present

## 2016-08-20 NOTE — Patient Instructions (Addendum)
The patient is aware to call back for any questions or concerns. Patient to return in six months right diagnotic mammogram.

## 2016-08-20 NOTE — Progress Notes (Signed)
Patient ID: Robin Ashley, female   DOB: 09-27-1931, 83 y.o.   MRN: [id number]  Chief Complaint  Patient presents with  . Follow-up    HPI Robin Ashley is a 83 y.o. female. Here today for her follow up breast cancer. Denies any breast issues.   HPI  Past Medical History:  Diagnosis Date  . 174.06 March 2013   Right breast, T1c, N0, 12 mm; ER PR positive, HER-2/neu not over expressed. Not a candidate for adjuvant chemotherapy per [facility] tumor board.  . Diabetes mellitus without complication ([place])   . Foot drop, right October 2014   noted post mastectomy, conservative treatment was instituted with resolution, mild edema.  . Hyperlipidemia   . Hypertension     Past Surgical History:  Procedure Laterality Date  . BREAST BIOPSY Left 02/25/13   positive  . BREAST SURGERY Left 03-15-13   left mastectomywith SN biopsy  . COLON SURGERY  1991   Likely segmental resection for diverticulitis based on patient description.  . COLONOSCOPY    . EYE SURGERY Bilateral 04/2012  . intestinal balloon removed  1991   Likely surgery for diverticulitis.  Marland Kitchen MASTECTOMY Left 2014   positive  . 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    Family History  Problem Relation Age of Onset  . Cancer Brother     colon  . Diabetes Brother   . Heart disease Brother   . Diabetes Brother   . Breast cancer Neg Hx     Social History Social History  Substance Use Topics  . Smoking status: Never Smoker  . Smokeless tobacco: Never Used  . Alcohol use No    No Known Allergies  Current Outpatient Prescriptions  Medication Sig Dispense Refill  . amLODipine (NORVASC) 5 MG tablet     . aspirin 81 MG tablet Take 1 tablet (81 mg total) by mouth daily. 30 tablet 0  . atorvastatin (LIPITOR) 80 MG tablet TAKE 1 TABLET DAILY 90 tablet 3  . ferrous sulfate ([employer]S IRON) 325 (65 FE) MG tablet Take 1 tablet (325 mg total) by mouth 2 (two) times daily. 180  tablet 1  . furosemide (LASIX) 20 MG tablet TAKE 1 TABLET DAILY 90 tablet 1  . letrozole (FEMARA) 2.5 MG tablet TAKE 1 TABLET DAILY 90 tablet 1  . lisinopril (PRINIVIL,ZESTRIL) 20 MG tablet     . meloxicam (MOBIC) 15 MG tablet TAKE 1 TABLET DAILY 90 tablet 1  . metFORMIN (GLUCOPHAGE) 500 MG tablet TAKE 1 TABLET TWICE A DAY 180 tablet 3  . Multiple Vitamins-Minerals (CENTRUM SILVER PO) Take 1 tablet by mouth daily.    Marland Kitchen omeprazole (PRILOSEC) 20 MG capsule TAKE 1 CAPSULE DAILY 90 capsule 4  . polyethylene glycol powder (GLYCOLAX/MIRALAX) powder   0  . traZODone (DESYREL) 50 MG tablet TAKE 1 TABLET EVERY EVENING 90 tablet 2   No current facility-administered medications for this visit.     Review of Systems Review of Systems  Constitutional: Negative.   Respiratory: Negative.   Cardiovascular: Negative.     Blood pressure (!) 152/80, pulse 92, resp. rate 16, height _0  (1.702 m), weight 221 lb (100.2 kg).  Physical Exam Physical Exam  Constitutional: She is oriented to person, place, and time. She appears well-developed and well-nourished.  HENT:  Mouth/Throat: Oropharynx is clear and moist.  Eyes: Conjunctivae are normal. No scleral icterus.  Neck: Neck supple.  Cardiovascular: Normal rate and regular  rhythm.   Murmur heard.  Systolic murmur is present with a grade of 1/6  Pulmonary/Chest: Effort normal and breath sounds normal. Right breast exhibits no inverted nipple, no mass, no nipple discharge, no skin change and no tenderness.  Left mastectomy site is clean and well healed.   Musculoskeletal:       Arms: Lymphadenopathy:    She has no cervical adenopathy.  Neurological: She is alert and oriented to person, place, and time.  Skin: Skin is warm and dry.  Psychiatric: Her behavior is normal.      Assessment    Left upper extremity lymphedema without interval change.       Plan     Patient to return in six months right diagnotic mammogram.   This  information has been scribed by Gaspar Cola CMA.     Robert Bellow 08/21/2016, 8:11 PM

## 2016-08-21 ENCOUNTER — Encounter: Payer: Self-pay | Admitting: General Surgery

## 2016-08-21 DIAGNOSIS — I890 Lymphedema, not elsewhere classified: Secondary | ICD-10-CM | POA: Insufficient documentation

## 2016-09-19 ENCOUNTER — Encounter: Payer: Medicare Other | Admitting: Physician Assistant

## 2016-09-26 ENCOUNTER — Ambulatory Visit (INDEPENDENT_AMBULATORY_CARE_PROVIDER_SITE_OTHER): Payer: Medicare Other

## 2016-09-26 ENCOUNTER — Ambulatory Visit (INDEPENDENT_AMBULATORY_CARE_PROVIDER_SITE_OTHER): Payer: Medicare Other | Admitting: Physician Assistant

## 2016-09-26 VITALS — BP 156/76 | HR 84 | Temp 98.6°F | Ht 67.0 in

## 2016-09-26 DIAGNOSIS — Z0000 Encounter for general adult medical examination without abnormal findings: Secondary | ICD-10-CM | POA: Diagnosis not present

## 2016-09-26 DIAGNOSIS — I10 Essential (primary) hypertension: Secondary | ICD-10-CM | POA: Diagnosis not present

## 2016-09-26 DIAGNOSIS — E119 Type 2 diabetes mellitus without complications: Secondary | ICD-10-CM

## 2016-09-26 NOTE — Patient Instructions (Signed)
Health Maintenance for Postmenopausal Women Menopause is a normal process in which your reproductive ability comes to an end. This process happens gradually over a span of months to years, usually between the ages of 33 and 38. Menopause is complete when you have missed 12 consecutive menstrual periods. It is important to talk with your health care provider about some of the most common conditions that affect postmenopausal women, such as heart disease, cancer, and bone loss (osteoporosis). Adopting a healthy lifestyle and getting preventive care can help to promote your health and wellness. Those actions can also lower your chances of developing some of these common conditions. What should I know about menopause? During menopause, you may experience a number of symptoms, such as:  Moderate-to-severe hot flashes.  Night sweats.  Decrease in sex drive.  Mood swings.  Headaches.  Tiredness.  Irritability.  Memory problems.  Insomnia. Choosing to treat or not to treat menopausal changes is an individual decision that you make with your health care provider. What should I know about hormone replacement therapy and supplements? Hormone therapy products are effective for treating symptoms that are associated with menopause, such as hot flashes and night sweats. Hormone replacement carries certain risks, especially as you become older. If you are thinking about using estrogen or estrogen with progestin treatments, discuss the benefits and risks with your health care provider. What should I know about heart disease and stroke? Heart disease, heart attack, and stroke become more likely as you age. This may be due, in part, to the hormonal changes that your body experiences during menopause. These can affect how your body processes dietary fats, triglycerides, and cholesterol. Heart attack and stroke are both medical emergencies. There are many things that you can do to help prevent heart disease  and stroke:  Have your blood pressure checked at least every 1-2 years. High blood pressure causes heart disease and increases the risk of stroke.  If you are 48-61 years old, ask your health care provider if you should take aspirin to prevent a heart attack or a stroke.  Do not use any tobacco products, including cigarettes, chewing tobacco, or electronic cigarettes. If you need help quitting, ask your health care provider.  It is important to eat a healthy diet and maintain a healthy weight.  Be sure to include plenty of vegetables, fruits, low-fat dairy products, and lean protein.  Avoid eating foods that are high in solid fats, added sugars, or salt (sodium).  Get regular exercise. This is one of the most important things that you can do for your health.  Try to exercise for at least 150 minutes each week. The type of exercise that you do should increase your heart rate and make you sweat. This is known as moderate-intensity exercise.  Try to do strengthening exercises at least twice each week. Do these in addition to the moderate-intensity exercise.  Know your numbers.Ask your health care provider to check your cholesterol and your blood glucose. Continue to have your blood tested as directed by your health care provider. What should I know about cancer screening? There are several types of cancer. Take the following steps to reduce your risk and to catch any cancer development as early as possible. Breast Cancer  Practice breast self-awareness.  This means understanding how your breasts normally appear and feel.  It also means doing regular breast self-exams. Let your health care provider know about any changes, no matter how small.  If you are 40 or older,  have a clinician do a breast exam (clinical breast exam or CBE) every year. Depending on your age, family history, and medical history, it may be recommended that you also have a yearly breast X-ray (mammogram).  If you  have a family history of breast cancer, talk with your health care provider about genetic screening.  If you are at high risk for breast cancer, talk with your health care provider about having an MRI and a mammogram every year.  Breast cancer (BRCA) gene test is recommended for women who have family members with BRCA-related cancers. Results of the assessment will determine the need for genetic counseling and BRCA1 and for BRCA2 testing. BRCA-related cancers include these types:  Breast. This occurs in males or females.  Ovarian.  Tubal. This may also be called fallopian tube cancer.  Cancer of the abdominal or pelvic lining (peritoneal cancer).  Prostate.  Pancreatic. Cervical, Uterine, and Ovarian Cancer  Your health care provider may recommend that you be screened regularly for cancer of the pelvic organs. These include your ovaries, uterus, and vagina. This screening involves a pelvic exam, which includes checking for microscopic changes to the surface of your cervix (Pap test).  For women ages 21-65, health care providers may recommend a pelvic exam and a Pap test every three years. For women ages 23-65, they may recommend the Pap test and pelvic exam, combined with testing for human papilloma virus (HPV), every five years. Some types of HPV increase your risk of cervical cancer. Testing for HPV may also be done on women of any age who have unclear Pap test results.  Other health care providers may not recommend any screening for nonpregnant women who are considered low risk for pelvic cancer and have no symptoms. Ask your health care provider if a screening pelvic exam is right for you.  If you have had past treatment for cervical cancer or a condition that could lead to cancer, you need Pap tests and screening for cancer for at least 20 years after your treatment. If Pap tests have been discontinued for you, your risk factors (such as having a new sexual partner) need to be reassessed  to determine if you should start having screenings again. Some women have medical problems that increase the chance of getting cervical cancer. In these cases, your health care provider may recommend that you have screening and Pap tests more often.  If you have a family history of uterine cancer or ovarian cancer, talk with your health care provider about genetic screening.  If you have vaginal bleeding after reaching menopause, tell your health care provider.  There are currently no reliable tests available to screen for ovarian cancer. Lung Cancer  Lung cancer screening is recommended for adults 99-83 years old who are at high risk for lung cancer because of a history of smoking. A yearly low-dose CT scan of the lungs is recommended if you:  Currently smoke.  Have a history of at least 30 pack-years of smoking and you currently smoke or have quit within the past 15 years. A pack-year is smoking an average of one pack of cigarettes per day for one year. Yearly screening should:  Continue until it has been 15 years since you quit.  Stop if you develop a health problem that would prevent you from having lung cancer treatment. Colorectal Cancer  This type of cancer can be detected and can often be prevented.  Routine colorectal cancer screening usually begins at age 72 and continues  through age 75.  If you have risk factors for colon cancer, your health care provider may recommend that you be screened at an earlier age.  If you have a family history of colorectal cancer, talk with your health care provider about genetic screening.  Your health care provider may also recommend using home test kits to check for hidden blood in your stool.  A small camera at the end of a tube can be used to examine your colon directly (sigmoidoscopy or colonoscopy). This is done to check for the earliest forms of colorectal cancer.  Direct examination of the colon should be repeated every 5-10 years until  age 75. However, if early forms of precancerous polyps or small growths are found or if you have a family history or genetic risk for colorectal cancer, you may need to be screened more often. Skin Cancer  Check your skin from head to toe regularly.  Monitor any moles. Be sure to tell your health care provider:  About any new moles or changes in moles, especially if there is a change in a mole's shape or color.  If you have a mole that is larger than the size of a pencil eraser.  If any of your family members has a history of skin cancer, especially at a young age, talk with your health care provider about genetic screening.  Always use sunscreen. Apply sunscreen liberally and repeatedly throughout the day.  Whenever you are outside, protect yourself by wearing long sleeves, pants, a wide-brimmed hat, and sunglasses. What should I know about osteoporosis? Osteoporosis is a condition in which bone destruction happens more quickly than new bone creation. After menopause, you may be at an increased risk for osteoporosis. To help prevent osteoporosis or the bone fractures that can happen because of osteoporosis, the following is recommended:  If you are 19-50 years old, get at least 1,000 mg of calcium and at least 600 mg of vitamin D per day.  If you are older than age 50 but younger than age 70, get at least 1,200 mg of calcium and at least 600 mg of vitamin D per day.  If you are older than age 70, get at least 1,200 mg of calcium and at least 800 mg of vitamin D per day. Smoking and excessive alcohol intake increase the risk of osteoporosis. Eat foods that are rich in calcium and vitamin D, and do weight-bearing exercises several times each week as directed by your health care provider. What should I know about how menopause affects my mental health? Depression may occur at any age, but it is more common as you become older. Common symptoms of depression include:  Low or sad  mood.  Changes in sleep patterns.  Changes in appetite or eating patterns.  Feeling an overall lack of motivation or enjoyment of activities that you previously enjoyed.  Frequent crying spells. Talk with your health care provider if you think that you are experiencing depression. What should I know about immunizations? It is important that you get and maintain your immunizations. These include:  Tetanus, diphtheria, and pertussis (Tdap) booster vaccine.  Influenza every year before the flu season begins.  Pneumonia vaccine.  Shingles vaccine. Your health care provider may also recommend other immunizations. This information is not intended to replace advice given to you by your health care provider. Make sure you discuss any questions you have with your health care provider. Document Released: 07/12/2005 Document Revised: 12/08/2015 Document Reviewed: 02/21/2015 Elsevier Interactive Patient   Education  2017 [employer]  

## 2016-09-26 NOTE — Progress Notes (Signed)
Patient: Robin Ashley Female    DOB: 19-May-1932   83 y.o.   MRN: [id number] Visit Date: 09/26/2016  Today's Provider: Mar Daring, PA-C   Chief Complaint  Patient presents with  . Diabetes  . Hypertension   Subjective:    HPI  Diabetes Mellitus Type II, Follow-up:   Lab Results  Component Value Date   HGBA1C 6.8 06/18/2016   HGBA1C 6.5 (H) 03/06/2016   HGBA1C 6.4 10/16/2015    Last seen for diabetes 6 months ago.  Management since then includes no changes. She reports good compliance with treatment. She is not having side effects.  Current symptoms include none and have been stable. Home blood sugar records: trend: stable and checked occasionally  Episodes of hypoglycemia? no   Current Insulin Regimen: none Most Recent Eye Exam: due Weight trend: stable Prior visit with dietician: no Current diet: well balanced Current exercise: none  Pertinent Labs:    Component Value Date/Time   CHOL 158 03/06/2016 0925   TRIG 170 (H) 03/06/2016 0925   HDL 47 03/06/2016 0925   LDLCALC 77 03/06/2016 0925   CREATININE 1.22 (H) 03/06/2016 0925   CREATININE 1.13 03/09/2013 1247    Wt Readings from Last 3 Encounters:  08/20/16 221 lb (100.2 kg)  03/06/16 226 lb 6.4 oz (102.7 kg)  02/19/16 225 lb (102.1 kg)       Hypertension, follow-up:  BP Readings from Last 3 Encounters:  09/26/16 (!) 156/76  08/20/16 (!) 152/80  06/18/16 (!) 142/80    She was last seen for hypertension 6 months ago.  BP at that visit was 152/80. Management since that visit includes no changes. She reports good compliance with treatment. She is not having side effects.  She is not exercising. She is adherent to low salt diet.   Outside blood pressures are not being checked. She is experiencing none.  Patient denies exertional chest pressure/discomfort, lower extremity edema and palpitations.   Cardiovascular risk factors include diabetes mellitus.   Weight trend:  stable Wt Readings from Last 3 Encounters:  08/20/16 221 lb (100.2 kg)  03/06/16 226 lb 6.4 oz (102.7 kg)  02/19/16 225 lb (102.1 kg)    Current diet: well balanced    No Known Allergies   Current Outpatient Prescriptions:  .  amLODipine (NORVASC) 5 MG tablet, , Disp: , Rfl:  .  aspirin 81 MG tablet, Take 1 tablet (81 mg total) by mouth daily., Disp: 30 tablet, Rfl: 0 .  atorvastatin (LIPITOR) 80 MG tablet, TAKE 1 TABLET DAILY, Disp: 90 tablet, Rfl: 3 .  ferrous sulfate ([employer] IRON) 325 (65 FE) MG tablet, Take 1 tablet (325 mg total) by mouth 2 (two) times daily., Disp: 180 tablet, Rfl: 1 .  furosemide (LASIX) 20 MG tablet, TAKE 1 TABLET DAILY, Disp: 90 tablet, Rfl: 1 .  letrozole (FEMARA) 2.5 MG tablet, TAKE 1 TABLET DAILY, Disp: 90 tablet, Rfl: 1 .  lisinopril (PRINIVIL,ZESTRIL) 20 MG tablet, , Disp: , Rfl:  .  meloxicam (MOBIC) 15 MG tablet, TAKE 1 TABLET DAILY, Disp: 90 tablet, Rfl: 1 .  metFORMIN (GLUCOPHAGE) 500 MG tablet, TAKE 1 TABLET TWICE A DAY, Disp: 180 tablet, Rfl: 3 .  Multiple Vitamins-Minerals (CENTRUM SILVER PO), Take 1 tablet by mouth daily., Disp: , Rfl:  .  omeprazole (PRILOSEC) 20 MG capsule, TAKE 1 CAPSULE DAILY, Disp: 90 capsule, Rfl: 4 .  polyethylene glycol powder (GLYCOLAX/MIRALAX) powder, , Disp: , Rfl: 0 .  traZODone (  DESYREL) 50 MG tablet, TAKE 1 TABLET EVERY EVENING, Disp: 90 tablet, Rfl: 2  Review of Systems  Constitutional: Negative.   Respiratory: Negative.   Cardiovascular: Negative.   Endocrine: Negative.   Musculoskeletal: Negative.   Neurological: Negative.   Psychiatric/Behavioral: Negative.     Social History  Substance Use Topics  . Smoking status: Never Smoker  . Smokeless tobacco: Never Used  . Alcohol use No   Objective:   BP    156/76 (BP Location: Right Arm)   Pulse  84   Temp  98.6 F (37 C) (Oral)   Ht  5\' 7"  (1.702 m)          Physical Exam  Constitutional: She appears well-developed and well-nourished. No  distress.  Neck: Normal range of motion. Neck supple. No JVD present. No tracheal deviation present. No thyromegaly present.  Cardiovascular: Normal rate, regular rhythm and normal heart sounds.  Exam reveals no gallop and no friction rub.   No murmur heard. Pulmonary/Chest: Effort normal and breath sounds normal. No respiratory distress. She has no wheezes. She has no rales.  Musculoskeletal: She exhibits no edema (compression stocking on right lower extremity).  Lymphadenopathy:    She has no cervical adenopathy.  Skin: She is not diaphoretic.  Vitals reviewed.       Assessment & Plan:     1. Essential hypertension Stable. Continue amlodipine 5mg , lisinopril 20mg . Will check labs as below and f/u pending results. I will see her back in 6 months for f/u and recheck all labs.  - CBC w/Diff/Platelet - Comprehensive Metabolic Panel (CMET) - HgB A1c  2. Type 2 diabetes mellitus without complication, without long-term current use of insulin (HCC) Stable. Continue metformin 500mg  BID. Will check labs as below and f/u pending results. I will see her back in 6 months to recheck all labs.  - CBC w/Diff/Platelet - Comprehensive Metabolic Panel (CMET) - HgB A1c       Mar Daring, PA-C  [facility]

## 2016-09-26 NOTE — Patient Instructions (Signed)
Robin Ashley , Thank you for taking time to come for your Medicare Wellness Visit. I appreciate your ongoing commitment to your health goals. Please review the following plan we discussed and let me know if I can assist you in the future.   Screening recommendations/referrals: Colonoscopy: N/A Mammogram: N/A Bone Density: completed 04/14/14, next due 04/2024 Recommended yearly ophthalmology/optometry visit for glaucoma screening and checkup Recommended yearly dental visit for hygiene and checkup  Vaccinations: Influenza vaccine: up to date, due 02/2017 Pneumococcal vaccine: completed series Tdap vaccine: completed 02/28/11, due 02/2021 Shingles vaccine: completed    Advanced directives: Please bring a copy of your POA (Power of [place]) and/or Living Will to your next appointment.   Conditions/risks identified: Recommend increasing water intake.  Next appointment: None, need to schedule 1 year AWV.    Preventive Care 38 Years and Older, Female Preventive care refers to lifestyle choices and visits with your health care provider that can promote health and wellness. What does preventive care include?  A yearly physical exam. This is also called an annual well check.  Dental exams once or twice a year.  Routine eye exams. Ask your health care provider how often you should have your eyes checked.  Personal lifestyle choices, including:  Daily care of your teeth and gums.  Regular physical activity.  Eating a healthy diet.  Avoiding tobacco and drug use.  Limiting alcohol use.  Practicing safe sex.  Taking low-dose aspirin every day.  Taking vitamin and mineral supplements as recommended by your health care provider. What happens during an annual well check? The services and screenings done by your health care provider during your annual well check will depend on your age, overall health, lifestyle risk factors, and family history of disease. Counseling  Your health  care provider may ask you questions about your:  Alcohol use.  Tobacco use.  Drug use.  Emotional well-being.  Home and relationship well-being.  Sexual activity.  Eating habits.  History of falls.  Memory and ability to understand (cognition).  Work and work Statistician.  Reproductive health. Screening  You may have the following tests or measurements:  Height, weight, and BMI.  Blood pressure.  Lipid and cholesterol levels. These may be checked every 5 years, or more frequently if you are over 35 years old.  Skin check.  Lung cancer screening. You may have this screening every year starting at age 79 if you have a 30-pack-year history of smoking and currently smoke or have quit within the past 15 years.  Fecal occult blood test (FOBT) of the stool. You may have this test every year starting at age 103.  Flexible sigmoidoscopy or colonoscopy. You may have a sigmoidoscopy every 5 years or a colonoscopy every 10 years starting at age 31.  Hepatitis C blood test.  Hepatitis B blood test.  Sexually transmitted disease (STD) testing.  Diabetes screening. This is done by checking your blood sugar (glucose) after you have not eaten for a while (fasting). You may have this done every 1-3 years.  Bone density scan. This is done to screen for osteoporosis. You may have this done starting at age 7.  Mammogram. This may be done every 1-2 years. Talk to your health care provider about how often you should have regular mammograms. Talk with your health care provider about your test results, treatment options, and if necessary, the need for more tests. Vaccines  Your health care provider may recommend certain vaccines, such as:  Influenza vaccine. This  is recommended every year.  Tetanus, diphtheria, and acellular pertussis (Tdap, Td) vaccine. You may need a Td booster every 10 years.  Zoster vaccine. You may need this after age 37.  Pneumococcal 13-valent conjugate  (PCV13) vaccine. One dose is recommended after age 64.  Pneumococcal polysaccharide (PPSV23) vaccine. One dose is recommended after age 32. Talk to your health care provider about which screenings and vaccines you need and how often you need them. This information is not intended to replace advice given to you by your health care provider. Make sure you discuss any questions you have with your health care provider. Document Released: 06/16/2015 Document Revised: 02/07/2016 Document Reviewed: 03/21/2015 Elsevier Interactive Patient Education  2017 [employer] Prevention in the Home Falls can cause injuries. They can happen to people of all ages. There are many things you can do to make your home safe and to help prevent falls. What can I do on the outside of my home?  Regularly fix the edges of walkways and driveways and fix any cracks.  Remove anything that might make you trip as you walk through a door, such as a raised step or threshold.  Trim any bushes or trees on the path to your home.  Use bright outdoor lighting.  Clear any walking paths of anything that might make someone trip, such as rocks or tools.  Regularly check to see if handrails are loose or broken. Make sure that both sides of any steps have handrails.  Any raised decks and porches should have guardrails on the edges.  Have any leaves, snow, or ice cleared regularly.  Use sand or salt on walking paths during winter.  Clean up any spills in your garage right away. This includes oil or grease spills. What can I do in the bathroom?  Use night lights.  Install grab bars by the toilet and in the tub and shower. Do not use towel bars as grab bars.  Use non-skid mats or decals in the tub or shower.  If you need to sit down in the shower, use a plastic, non-slip stool.  Keep the floor dry. Clean up any water that spills on the floor as soon as it happens.  Remove soap buildup in the tub or shower  regularly.  Attach bath mats securely with double-sided non-slip rug tape.  Do not have throw rugs and other things on the floor that can make you trip. What can I do in the bedroom?  Use night lights.  Make sure that you have a light by your bed that is easy to reach.  Do not use any sheets or blankets that are too big for your bed. They should not hang down onto the floor.  Have a firm chair that has side arms. You can use this for support while you get dressed.  Do not have throw rugs and other things on the floor that can make you trip. What can I do in the kitchen?  Clean up any spills right away.  Avoid walking on wet floors.  Keep items that you use a lot in easy-to-reach places.  If you need to reach something above you, use a strong step stool that has a grab bar.  Keep electrical cords out of the way.  Do not use floor polish or wax that makes floors slippery. If you must use wax, use non-skid floor wax.  Do not have throw rugs and other things on the floor that can make you  trip. What can I do with my stairs?  Do not leave any items on the stairs.  Make sure that there are handrails on both sides of the stairs and use them. Fix handrails that are broken or loose. Make sure that handrails are as long as the stairways.  Check any carpeting to make sure that it is firmly attached to the stairs. Fix any carpet that is loose or worn.  Avoid having throw rugs at the top or bottom of the stairs. If you do have throw rugs, attach them to the floor with carpet tape.  Make sure that you have a light switch at the top of the stairs and the bottom of the stairs. If you do not have them, ask someone to add them for you. What else can I do to help prevent falls?  Wear shoes that:  Do not have high heels.  Have rubber bottoms.  Are comfortable and fit you well.  Are closed at the toe. Do not wear sandals.  If you use a stepladder:  Make sure that it is fully  opened. Do not climb a closed stepladder.  Make sure that both sides of the stepladder are locked into place.  Ask someone to hold it for you, if possible.  Clearly mark and make sure that you can see:  Any grab bars or handrails.  First and last steps.  Where the edge of each step is.  Use tools that help you move around (mobility aids) if they are needed. These include:  Canes.  Walkers.  Scooters.  Crutches.  Turn on the lights when you go into a dark area. Replace any light bulbs as soon as they burn out.  Set up your furniture so you have a clear path. Avoid moving your furniture around.  If any of your floors are uneven, fix them.  If there are any pets around you, be aware of where they are.  Review your medicines with your doctor. Some medicines can make you feel dizzy. This can increase your chance of falling. Ask your doctor what other things that you can do to help prevent falls. This information is not intended to replace advice given to you by your health care provider. Make sure you discuss any questions you have with your health care provider. Document Released: 03/16/2009 Document Revised: 10/26/2015 Document Reviewed: 06/24/2014 Elsevier Interactive Patient Education  2017 [employer].

## 2016-09-26 NOTE — Progress Notes (Signed)
Subjective:   Robin Ashley is a 83 y.o. female who presents for Medicare Annual (Subsequent) preventive examination.  Review of Systems:  N/A  Cardiac Risk Factors include: advanced age (>64mn, >>59women);diabetes mellitus;dyslipidemia;hypertension;obesity (BMI >30kg/m2)     Objective:     Vitals: BP (!) 156/76 (BP Location: Right Arm)   Pulse 84   Temp 98.6 F (37 C) (Oral)   Ht 5' 7"  (1.702 m)   There is no height or weight on file to calculate BMI.   Tobacco History  Smoking Status  . Never Smoker  Smokeless Tobacco  . Never Used     Counseling given: Not Answered   Past Medical History:  Diagnosis Date  . 174.4 03/2013   Left breast, T1c, N0, 12 mm; ER PR positive, HER-2/neu not over expressed. Not a candidate for adjuvant chemotherapy per [facility].  . Diabetes mellitus without complication ([place]   . Foot drop, right October 2014   noted post mastectomy, conservative treatment was instituted with resolution, mild edema.  . Hyperlipidemia   . Hypertension    Past Surgical History:  Procedure Laterality Date  . BREAST BIOPSY Left 02/25/13   positive  . BREAST SURGERY Left 03-15-13   left mastectomywith SN biopsy  . COLON SURGERY  1991   Likely segmental resection for diverticulitis based on patient description.  . COLONOSCOPY    . EYE SURGERY Bilateral 04/2012  . intestinal balloon removed  1991   Likely surgery for diverticulitis.  .Marland KitchenMASTECTOMY Left 2014   positive  . 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   Family History  Problem Relation Age of Onset  . Cancer Brother     colon  . Diabetes Brother   . Heart disease Brother   . Diabetes Brother   . Breast cancer Neg Hx    History  Sexual Activity  . Sexual activity: Not on file    Outpatient Encounter Prescriptions as of 09/26/2016  Medication Sig  . amLODipine (NORVASC) 5 MG tablet   . aspirin 81 MG tablet Take 1 tablet  (81 mg total) by mouth daily.  .Marland Kitchenatorvastatin (LIPITOR) 80 MG tablet TAKE 1 TABLET DAILY  . ferrous sulfate ([facility] IRON) 325 (65 FE) MG tablet Take 1 tablet (325 mg total) by mouth 2 (two) times daily.  . furosemide (LASIX) 20 MG tablet TAKE 1 TABLET DAILY  . letrozole (FEMARA) 2.5 MG tablet TAKE 1 TABLET DAILY  . lisinopril (PRINIVIL,ZESTRIL) 20 MG tablet   . meloxicam (MOBIC) 15 MG tablet TAKE 1 TABLET DAILY  . metFORMIN (GLUCOPHAGE) 500 MG tablet TAKE 1 TABLET TWICE A DAY  . Multiple Vitamins-Minerals (CENTRUM SILVER PO) Take 1 tablet by mouth daily.  .Marland Kitchenomeprazole (PRILOSEC) 20 MG capsule TAKE 1 CAPSULE DAILY  . polyethylene glycol powder (GLYCOLAX/MIRALAX) powder   . traZODone (DESYREL) 50 MG tablet TAKE 1 TABLET EVERY EVENING   No facility-administered encounter medications on file as of 09/26/2016.     Activities of Daily Living In your present state of health, do you have any difficulty performing the following activities: 09/26/2016  Hearing? N  Vision? N  Difficulty concentrating or making decisions? N  Walking or climbing stairs? Y  Dressing or bathing? N  Doing errands, shopping? N  Preparing Food and eating ? N  Using the Toilet? N  In the past six months, have you accidently leaked urine? N  Do you have problems with  loss of bowel control? N  Managing your Medications? N  Managing your Finances? N  Housekeeping or managing your Housekeeping? N  Some recent data might be hidden    Patient Care Team: Mar Daring, PA-C as PCP - General (Family Medicine) Robert Bellow, MD (General Surgery) Idelle Leech, OD as Consulting Physician (Optometry)    Assessment:     Exercise Activities and Dietary recommendations Current Exercise Habits: The patient does not participate in regular exercise at present, Exercise limited by: None identified  Goals    . Increase water intake          Recommend increasing water intake to at least 2 glasses a day. Pt declines  anymore.      Fall Risk Fall Risk  09/26/2016 02/17/2015  Falls in the past year? No No   Depression Screen PHQ 2/9 Scores 09/26/2016 09/26/2016 02/17/2015  PHQ - 2 Score 0 0 0  PHQ- 9 Score 4 - -     Cognitive Function     6CIT Screen 09/26/2016  What Year? 0 points  What month? 0 points  What time? 0 points  Count back from 20 0 points  Months in reverse 0 points  Repeat phrase 4 points  Total Score 4    Immunization History  Administered Date(s) Administered  . Influenza, High Dose Seasonal PF 02/17/2015, 03/06/2016  . Pneumococcal Conjugate-13 03/07/2014  . Pneumococcal Polysaccharide-23 06/19/2015  . Tdap 02/28/2011  . Zoster 05/11/2012   Screening Tests Health Maintenance  Topic Date Due  . HEMOGLOBIN A1C  12/16/2016  . INFLUENZA VACCINE  01/01/2017  . FOOT EXAM  06/18/2017  . OPHTHALMOLOGY EXAM  07/09/2017  . TETANUS/TDAP  02/27/2021  . DEXA SCAN  Completed  . PNA vac Low Risk Adult  Completed      Plan:  I have personally reviewed and addressed the Medicare Annual Wellness questionnaire and have noted the following in the patient's chart:  A. Medical and social history B. Use of alcohol, tobacco or illicit drugs  C. Current medications and supplements D. Functional ability and status E.  Nutritional status F.  Physical activity G. Advance directives H. List of other physicians I.  Hospitalizations, surgeries, and ER visits in previous 12 months J.  [place] such as hearing and vision if needed, cognitive and depression L. Referrals and appointments - none  In addition, I have reviewed and discussed with patient certain preventive protocols, quality metrics, and best practice recommendations. A written personalized care plan for preventive services as well as general preventive health recommendations were provided to patient.  See attached scanned questionnaire for additional information.   Signed,  Fabio Neighbors, LPN Nurse Health  Advisor   MD Recommendations: None.  I have reviewed the documentation and information obtained by Fabio Neighbors, LPN in the above chart and agree as above. I was available for consultation if any questions or issues arose.  [name], PA-C

## 2016-09-27 ENCOUNTER — Emergency Department: Payer: Medicare Other

## 2016-09-27 ENCOUNTER — Encounter: Payer: Self-pay | Admitting: Emergency Medicine

## 2016-09-27 ENCOUNTER — Observation Stay
Admission: EM | Admit: 2016-09-27 | Discharge: 2016-09-29 | Disposition: A | Discharge status: Home / Self Care | Payer: Medicare Other | Attending: Specialist | Admitting: Specialist

## 2016-09-27 DIAGNOSIS — M25551 Pain in right hip: Secondary | ICD-10-CM | POA: Diagnosis not present

## 2016-09-27 DIAGNOSIS — Z833 Family history of diabetes mellitus: Secondary | ICD-10-CM | POA: Diagnosis not present

## 2016-09-27 DIAGNOSIS — Z7984 Long term (current) use of oral hypoglycemic drugs: Secondary | ICD-10-CM | POA: Diagnosis not present

## 2016-09-27 DIAGNOSIS — Z7982 Long term (current) use of aspirin: Secondary | ICD-10-CM | POA: Diagnosis not present

## 2016-09-27 DIAGNOSIS — Z853 Personal history of malignant neoplasm of breast: Secondary | ICD-10-CM | POA: Insufficient documentation

## 2016-09-27 DIAGNOSIS — E785 Hyperlipidemia, unspecified: Secondary | ICD-10-CM | POA: Insufficient documentation

## 2016-09-27 DIAGNOSIS — S329XXA Fracture of unspecified parts of lumbosacral spine and pelvis, initial encounter for closed fracture: Secondary | ICD-10-CM

## 2016-09-27 DIAGNOSIS — S32591A Other specified fracture of right pubis, initial encounter for closed fracture: Secondary | ICD-10-CM | POA: Insufficient documentation

## 2016-09-27 DIAGNOSIS — I129 Hypertensive chronic kidney disease with stage 1 through stage 4 chronic kidney disease, or unspecified chronic kidney disease: Secondary | ICD-10-CM | POA: Diagnosis not present

## 2016-09-27 DIAGNOSIS — S32599A Other specified fracture of unspecified pubis, initial encounter for closed fracture: Secondary | ICD-10-CM | POA: Diagnosis not present

## 2016-09-27 DIAGNOSIS — W19XXXA Unspecified fall, initial encounter: Secondary | ICD-10-CM | POA: Diagnosis not present

## 2016-09-27 DIAGNOSIS — S32511A Fracture of superior rim of right pubis, initial encounter for closed fracture: Principal | ICD-10-CM | POA: Insufficient documentation

## 2016-09-27 DIAGNOSIS — Z79899 Other long term (current) drug therapy: Secondary | ICD-10-CM | POA: Diagnosis not present

## 2016-09-27 DIAGNOSIS — E1122 Type 2 diabetes mellitus with diabetic chronic kidney disease: Secondary | ICD-10-CM

## 2016-09-27 DIAGNOSIS — S32501A Unspecified fracture of right pubis, initial encounter for closed fracture: Secondary | ICD-10-CM | POA: Diagnosis present

## 2016-09-27 DIAGNOSIS — M25559 Pain in unspecified hip: Secondary | ICD-10-CM | POA: Diagnosis not present

## 2016-09-27 DIAGNOSIS — Z9181 History of falling: Secondary | ICD-10-CM | POA: Diagnosis not present

## 2016-09-27 DIAGNOSIS — K219 Gastro-esophageal reflux disease without esophagitis: Secondary | ICD-10-CM | POA: Diagnosis not present

## 2016-09-27 DIAGNOSIS — N183 Chronic kidney disease, stage 3 (moderate): Secondary | ICD-10-CM | POA: Diagnosis not present

## 2016-09-27 DIAGNOSIS — W101XXA Fall (on)(from) sidewalk curb, initial encounter: Secondary | ICD-10-CM | POA: Diagnosis not present

## 2016-09-27 DIAGNOSIS — I10 Essential (primary) hypertension: Secondary | ICD-10-CM | POA: Diagnosis not present

## 2016-09-27 LAB — COMPREHENSIVE METABOLIC PANEL
ALBUMIN: 4.1 g/dL (ref 3.5–4.7)
ALT: 9 IU/L (ref 0–32)
AST: 15 IU/L (ref 0–40)
Albumin/Globulin Ratio: 1.3 (ref 1.2–2.2)
Alkaline Phosphatase: 163 IU/L — ABNORMAL HIGH (ref 39–117)
BUN/Creatinine Ratio: 9 — ABNORMAL LOW (ref 12–28)
BUN: 10 mg/dL (ref 8–27)
Bilirubin Total: 0.3 mg/dL (ref 0.0–1.2)
CALCIUM: 9.2 mg/dL (ref 8.7–10.3)
CO2: 23 mmol/L (ref 18–29)
CREATININE: 1.09 mg/dL — AB (ref 0.57–1.00)
Chloride: 103 mmol/L (ref 96–106)
GFR calc non Af Amer: 47 mL/min/{1.73_m2} — ABNORMAL LOW (ref >59)
GFR, EST AFRICAN AMERICAN: 55 mL/min/{1.73_m2} — AB (ref >59)
GLUCOSE: 104 mg/dL — AB (ref 65–99)
Globulin, Total: 3.2 g/dL (ref 1.5–4.5)
Potassium: 4.1 mmol/L (ref 3.5–5.2)
Sodium: 143 mmol/L (ref 134–144)
TOTAL PROTEIN: 7.3 g/dL (ref 6.0–8.5)

## 2016-09-27 LAB — HEMOGLOBIN A1C
ESTIMATED AVERAGE GLUCOSE: 143 mg/dL
Hgb A1c MFr Bld: 6.6 % — ABNORMAL HIGH (ref 4.8–5.6)

## 2016-09-27 LAB — CBC WITH DIFFERENTIAL/PLATELET
BASOS ABS: 0.1 10*3/uL (ref 0.0–0.2)
Basos: 1 %
EOS (ABSOLUTE): 0.4 10*3/uL (ref 0.0–0.4)
Eos: 3 %
HEMOGLOBIN: 12.4 g/dL (ref 11.1–15.9)
Hematocrit: 37.9 % (ref 34.0–46.6)
IMMATURE GRANS (ABS): 0 10*3/uL (ref 0.0–0.1)
IMMATURE GRANULOCYTES: 0 %
LYMPHS: 28 %
Lymphocytes Absolute: 3.7 10*3/uL — ABNORMAL HIGH (ref 0.7–3.1)
MCH: 30.2 pg (ref 26.6–33.0)
MCHC: 32.7 g/dL (ref 31.5–35.7)
MCV: 92 fL (ref 79–97)
MONOCYTES: 11 %
Monocytes Absolute: 1.4 10*3/uL — ABNORMAL HIGH (ref 0.1–0.9)
NEUTROS ABS: 7.6 10*3/uL — AB (ref 1.4–7.0)
NEUTROS PCT: 57 %
Platelets: 353 10*3/uL (ref 150–379)
RBC: 4.11 x10E6/uL (ref 3.77–5.28)
RDW: 14.7 % (ref 12.3–15.4)
WBC: 13.2 10*3/uL — ABNORMAL HIGH (ref 3.4–10.8)

## 2016-09-27 MED ORDER — PANTOPRAZOLE SODIUM 40 MG PO TBEC
40.0000 mg | DELAYED_RELEASE_TABLET | Freq: Every day | ORAL | Status: DC
  Administered 2016-09-28 – 2016-09-29 (×2): 40 mg via ORAL
  Filled 2016-09-27 (×2): qty 1

## 2016-09-27 MED ORDER — ONDANSETRON HCL 4 MG PO TABS: 4.0000 mg | ORAL_TABLET | Freq: Four times a day (QID) | ORAL | Status: DC | PRN

## 2016-09-27 MED ORDER — AMLODIPINE BESYLATE 5 MG PO TABS
5.0000 mg | ORAL_TABLET | Freq: Every day | ORAL | Status: DC
  Administered 2016-09-28 – 2016-09-29 (×2): 5 mg via ORAL
  Filled 2016-09-27 (×2): qty 1

## 2016-09-27 MED ORDER — ACETAMINOPHEN 325 MG PO TABS: 650.0000 mg | ORAL_TABLET | Freq: Four times a day (QID) | ORAL | Status: DC | PRN

## 2016-09-27 MED ORDER — SODIUM CHLORIDE 0.9% FLUSH
3.0000 mL | Freq: Two times a day (BID) | INTRAVENOUS | Status: DC
  Administered 2016-09-28 – 2016-09-29 (×4): 3 mL via INTRAVENOUS

## 2016-09-27 MED ORDER — ASPIRIN EC 81 MG PO TBEC
81.0000 mg | DELAYED_RELEASE_TABLET | Freq: Every day | ORAL | Status: DC
  Administered 2016-09-28 – 2016-09-29 (×2): 81 mg via ORAL
  Filled 2016-09-27 (×2): qty 1

## 2016-09-27 MED ORDER — SENNOSIDES-DOCUSATE SODIUM 8.6-50 MG PO TABS: 1.0000 | ORAL_TABLET | Freq: Every evening | ORAL | Status: DC | PRN

## 2016-09-27 MED ORDER — ADULT MULTIVITAMIN W/MINERALS CH
ORAL_TABLET | Freq: Every day | ORAL | Status: DC
  Administered 2016-09-28 – 2016-09-29 (×2): 1 via ORAL
  Filled 2016-09-27 (×2): qty 1

## 2016-09-27 MED ORDER — LETROZOLE 2.5 MG PO TABS
2.5000 mg | ORAL_TABLET | Freq: Every day | ORAL | Status: DC
  Administered 2016-09-28 – 2016-09-29 (×2): 2.5 mg via ORAL
  Filled 2016-09-27 (×2): qty 1

## 2016-09-27 MED ORDER — ATORVASTATIN CALCIUM 20 MG PO TABS
80.0000 mg | ORAL_TABLET | Freq: Every day | ORAL | Status: DC
  Administered 2016-09-28: 80 mg via ORAL
  Filled 2016-09-27 (×2): qty 4

## 2016-09-27 MED ORDER — ONDANSETRON HCL 4 MG/2ML IJ SOLN: 4.0000 mg | Freq: Four times a day (QID) | INTRAMUSCULAR | Status: DC | PRN

## 2016-09-27 MED ORDER — METFORMIN HCL 500 MG PO TABS
500.0000 mg | ORAL_TABLET | Freq: Two times a day (BID) | ORAL | Status: DC
  Administered 2016-09-28 – 2016-09-29 (×3): 500 mg via ORAL
  Filled 2016-09-27 (×3): qty 1

## 2016-09-27 MED ORDER — HYDROCODONE-ACETAMINOPHEN 5-325 MG PO TABS
1.0000 | ORAL_TABLET | ORAL | Status: DC | PRN
  Administered 2016-09-28 (×2): 2 via ORAL
  Filled 2016-09-27 (×2): qty 2

## 2016-09-27 MED ORDER — ACETAMINOPHEN 650 MG RE SUPP: 650.0000 mg | Freq: Four times a day (QID) | RECTAL | Status: DC | PRN

## 2016-09-27 MED ORDER — SODIUM CHLORIDE 0.9 % IV SOLN: 250.0000 mL | INTRAVENOUS | Status: DC | PRN

## 2016-09-27 MED ORDER — SODIUM CHLORIDE 0.9% FLUSH: 3.0000 mL | INTRAVENOUS | Status: DC | PRN

## 2016-09-27 MED ORDER — ENOXAPARIN SODIUM 40 MG/0.4ML ~~LOC~~ SOLN
40.0000 mg | SUBCUTANEOUS | Status: DC
  Administered 2016-09-28 (×2): 40 mg via SUBCUTANEOUS
  Filled 2016-09-27 (×2): qty 0.4

## 2016-09-27 MED ORDER — TRAZODONE HCL 50 MG PO TABS
50.0000 mg | ORAL_TABLET | Freq: Every evening | ORAL | Status: DC
  Administered 2016-09-28: 50 mg via ORAL
  Filled 2016-09-27: qty 1

## 2016-09-27 MED ORDER — FUROSEMIDE 20 MG PO TABS
20.0000 mg | ORAL_TABLET | Freq: Every day | ORAL | Status: DC
  Administered 2016-09-28 – 2016-09-29 (×2): 20 mg via ORAL
  Filled 2016-09-27 (×2): qty 1

## 2016-09-27 MED ORDER — LISINOPRIL 20 MG PO TABS
20.0000 mg | ORAL_TABLET | Freq: Every day | ORAL | Status: DC
  Administered 2016-09-28 – 2016-09-29 (×2): 20 mg via ORAL
  Filled 2016-09-27 (×2): qty 1

## 2016-09-27 MED ORDER — MELOXICAM 7.5 MG PO TABS
15.0000 mg | ORAL_TABLET | Freq: Every day | ORAL | Status: DC
  Administered 2016-09-28: 15 mg via ORAL
  Filled 2016-09-27: qty 2

## 2016-09-27 MED ORDER — MORPHINE SULFATE (PF) 2 MG/ML IV SOLN: 2.0000 mg | INTRAVENOUS | Status: DC | PRN

## 2016-09-27 MED ORDER — FERROUS SULFATE 325 (65 FE) MG PO TABS
325.0000 mg | ORAL_TABLET | Freq: Two times a day (BID) | ORAL | Status: DC
  Administered 2016-09-28 – 2016-09-29 (×4): 325 mg via ORAL
  Filled 2016-09-27 (×4): qty 1

## 2016-09-27 NOTE — ED Triage Notes (Signed)
Patient from home via [facility]. Reports she tripped over the curb and landed on her right elbow and hip. Reports she was able to get up with assistance. EMS reports patient was able to bear weight to get on stretcher. Patient denies hitting head or LOC. Reports slightly more swelling than normal noted in right leg. Patient denies recent falls. A&O x4 upon arrival

## 2016-09-27 NOTE — H&P (Signed)
Robin Ashley at [place] NAME: Robin Ashley    MR#:  [id number]  DATE OF BIRTH:  04-Aug-1931  DATE OF ADMISSION:  09/27/2016  PRIMARY CARE PHYSICIAN: Mar Daring, PA-C   REQUESTING/REFERRING PHYSICIAN:   CHIEF COMPLAINT:   Chief Complaint  Patient presents with  . Fall    HISTORY OF PRESENT ILLNESS: Robin Ashley  is a 83 y.o. female with a known history of Hypertension, hyperlipidemia, diabetes mellitus type 2, right footdrop presented to the emergency room after a fall. Patient fell in front of her apartment complex around 4 PM after she came out of the apartment complex. She was trying to get in the car but tripped and fell and landed on her right hip. No history of any head injury. No history of any loss of consciousness. No complaints of any chest pain, shortness of breath. Patient has aching pain in the right hip which is 7 out of 10 on a scale of 1-10. She was not able to bear weight in the emergency room and ambulate because of the pain. She was worked up in the emergency room with CT pelvis which showed a right superior and inferior pubic ramus fracture. Hospitalist service was consulted for pain management. Case was discussed with orthopedic service who recommended no surgery and physical therapy evaluation in the morning. Patient seen and evaluated by me on 09/27/2016.  PAST MEDICAL HISTORY:   Past Medical History:  Diagnosis Date  . 174.4 03/2013   Left breast, T1c, N0, 12 mm; ER PR positive, HER-2/neu not over expressed. Not a candidate for adjuvant chemotherapy per [facility] tumor board.  . Diabetes mellitus without complication ([place])   . Foot drop, right October 2014   noted post mastectomy, conservative treatment was instituted with resolution, mild edema.  . Hyperlipidemia   . Hypertension     PAST SURGICAL HISTORY: Past Surgical History:  Procedure Laterality Date  . BREAST BIOPSY Left 02/25/13   positive  .  BREAST SURGERY Left 03-15-13   left mastectomywith SN biopsy  . COLON SURGERY  1991   Likely segmental resection for diverticulitis based on patient description.  . COLONOSCOPY    . EYE SURGERY Bilateral 04/2012  . intestinal balloon removed  1991   Likely surgery for diverticulitis.  Marland Kitchen MASTECTOMY Left 2014   positive  . 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    SOCIAL HISTORY:  Social History  Substance Use Topics  . Smoking status: Never Smoker  . Smokeless tobacco: Never Used  . Alcohol use No    FAMILY HISTORY:  Family History  Problem Relation Age of Onset  . Cancer Brother     colon  . Diabetes Brother   . Heart disease Brother   . Diabetes Brother   . Breast cancer Neg Hx     DRUG ALLERGIES: No Known Allergies  REVIEW OF SYSTEMS:   CONSTITUTIONAL: No fever, fatigue or weakness.  EYES: No blurred or double vision.  EARS, NOSE, AND THROAT: No tinnitus or ear pain.  RESPIRATORY: No cough, shortness of breath, wheezing or hemoptysis.  CARDIOVASCULAR: No chest pain, orthopnea, edema.  GASTROINTESTINAL: No nausea, vomiting, diarrhea or abdominal pain.  GENITOURINARY: No dysuria, hematuria.  ENDOCRINE: No polyuria, nocturia,  HEMATOLOGY: No anemia, easy bruising or bleeding SKIN: No rash or lesion. MUSCULOSKELETAL: Right hip pain NEUROLOGIC: No tingling, numbness, weakness.  PSYCHIATRY: No anxiety or depression.  MEDICATIONS AT HOME:  Prior to Admission medications   Medication Sig Start Date End Date Taking? Authorizing Provider  amLODipine (NORVASC) 5 MG tablet  10/26/15  Yes Historical Provider, MD  aspirin 81 MG tablet Take 1 tablet (81 mg total) by mouth daily. 06/19/15  Yes Margarita Rana, MD  atorvastatin (LIPITOR) 80 MG tablet TAKE 1 TABLET DAILY 11/29/15  Yes Margarita Rana, MD  ferrous sulfate ([facility] IRON) 325 (65 FE) MG tablet Take 1 tablet (325 mg total) by mouth 2 (two) times daily. 12/21/15  Yes  Clearnce Sorrel Burnette, PA-C  furosemide (LASIX) 20 MG tablet TAKE 1 TABLET DAILY 08/19/16  Yes Clearnce Sorrel Burnette, PA-C  letrozole [facility]) 2.5 MG tablet TAKE 1 TABLET DAILY 04/29/16  Yes Clearnce Sorrel Burnette, PA-C  lisinopril (PRINIVIL,ZESTRIL) 20 MG tablet  10/26/15  Yes Historical Provider, MD  meloxicam (MOBIC) 15 MG tablet TAKE 1 TABLET DAILY 04/29/16  Yes Clearnce Sorrel Burnette, PA-C  metFORMIN (GLUCOPHAGE) 500 MG tablet TAKE 1 TABLET TWICE A DAY 11/29/15  Yes Margarita Rana, MD  Multiple Vitamins-Minerals (CENTRUM SILVER PO) Take 1 tablet by mouth daily.   Yes Historical Provider, MD  omeprazole (PRILOSEC) 20 MG capsule TAKE 1 CAPSULE DAILY 01/30/16  Yes Robert Bellow, MD  polyethylene glycol powder [facility]) powder  03/24/14  Yes Historical Provider, MD  traZODone (DESYREL) 50 MG tablet TAKE 1 TABLET EVERY EVENING 04/23/16  Yes Clearnce Sorrel Burnette, PA-C      PHYSICAL EXAMINATION:   VITAL SIGNS: Blood pressure (!) 179/85, pulse 86, temperature 99.2 F (37.3 C), temperature source Oral, resp. rate 18, height 5' 7"  (1.702 m), weight 99.8 kg (220 lb), SpO2 96 %.  GENERAL:  83 y.o.-year-old patient lying in the bed with no acute distress.  EYES: Pupils equal, round, reactive to light and accommodation. No scleral icterus. Extraocular muscles intact.  HEENT: Head atraumatic, normocephalic. Oropharynx and nasopharynx clear.  NECK:  Supple, no jugular venous distention. No thyroid enlargement, no tenderness.  LUNGS: Normal breath sounds bilaterally, no wheezing, rales,rhonchi or crepitation. No use of accessory muscles of respiration.  CARDIOVASCULAR: S1, S2 normal. No murmurs, rubs, or gallops.  ABDOMEN: Soft, nontender, nondistended. Bowel sounds present. No organomegaly or mass.  EXTREMITIES: No pedal edema, cyanosis, or clubbing.  Tenderness right hip NEUROLOGIC: Cranial nerves II through XII are intact. Muscle strength 5/5 in all extremities. Sensation intact. Gait not checked.   PSYCHIATRIC: The patient is alert and oriented x 3.  SKIN: No obvious rash, lesion, or ulcer.   LABORATORY PANEL:   CBC  Recent Labs Lab 09/26/16 1509  WBC 13.2*  HCT 37.9  PLT 353  MCV 92  MCH 30.2  MCHC 32.7  RDW 14.7  LYMPHSABS 3.7*  EOSABS 0.4  BASOSABS 0.1   ------------------------------------------------------------------------------------------------------------------  Chemistries   Recent Labs Lab 09/26/16 1509  NA 143  K 4.1  CL 103  CO2 23  GLUCOSE 104*  BUN 10  CREATININE 1.09*  CALCIUM 9.2  AST 15  ALT 9  ALKPHOS 163*  BILITOT 0.3   ------------------------------------------------------------------------------------------------------------------ estimated creatinine clearance is 48.3 mL/min (A) (by C-G formula based on SCr of 1.09 mg/dL (H)). ------------------------------------------------------------------------------------------------------------------ No results for input(s): TSH, T4TOTAL, T3FREE, THYROIDAB in the last 72 hours.  Invalid input(s): FREET3   Coagulation profile No results for input(s): INR, PROTIME in the last 168 hours. ------------------------------------------------------------------------------------------------------------------- No results for input(s): DDIMER in the last 72 hours. -------------------------------------------------------------------------------------------------------------------  Cardiac Enzymes No results for input(s): CKMB, TROPONINI, MYOGLOBIN in the last 168 hours.  Invalid input(s): CK ------------------------------------------------------------------------------------------------------------------ Invalid input(s): POCBNP  ---------------------------------------------------------------------------------------------------------------  Urinalysis No results found for: COLORURINE, APPEARANCEUR, LABSPEC, PHURINE, GLUCOSEU, HGBUR, BILIRUBINUR, KETONESUR, PROTEINUR, UROBILINOGEN, NITRITE,  LEUKOCYTESUR   RADIOLOGY: Ct Pelvis Wo Contrast  Result Date: 09/27/2016 CLINICAL DATA:  Right hip pain after fall today EXAM: CT PELVIS WITHOUT CONTRAST TECHNIQUE: Multidetector CT imaging of the pelvis was performed following the standard protocol without intravenous contrast. COMPARISON:  None. FINDINGS: Urinary Tract: Exophytic lesions of the visualized right kidney are noted two which are not simple cysts but may represent complex cysts measuring 2.7 cm in diameter and 1 cm respectively along the lateral as well as medial aspect of the kidney. A larger exophytic 3.4 cm simple cyst is noted with water attenuation anteriorly. These can be further correlated with nonemergent ultrasound as deemed clinically necessary. Bowel:  No acute bowel obstruction or inflammation. Vascular/Lymphatic: Aortoiliac atherosclerosis without aneurysm. No lymphadenopathy. Reproductive: Uterus and ovaries are visualized and demonstrate no acute appearing abnormalities. Idiopathic calcifications are noted of the ovaries. Other:  No free air or free fluid. Musculoskeletal: Acute minimally displaced fractures of the right superior pubic ramus near the pubic symphysis and of the mid inferior pubic ramus. Both hip joints maintained. 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 Grade 1 anterolisthesis of L4 on L5 likely on the basis of degenerative facet arthropathy with degenerative disc disease. IMPRESSION: 1. Acute minimally displaced fractures of the right superior pubic ramus near the pubic symphysis and of the mid inferior pubic ramus. 2. 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 3. Grade 1 anterolisthesis of L4 on L5 with degenerative disc disease. 4. Simple and likely complex right renal cysts which could be further correlated with nonemergent ultrasound or dedicated CT without and with IV contrast as deemed clinically necessary. Electronically Signed   By: Ashley Royalty M.D.   On: 09/27/2016  22:44   Dg Hip Unilat  With Pelvis 2-3 Views Right  Result Date: 09/27/2016 CLINICAL DATA:  [place] on the hip, pain EXAM: DG HIP (WITH OR WITHOUT PELVIS) 2-3V RIGHT COMPARISON:  None. FINDINGS: The femoral heads appear normally positioned. There is slight widening of the pubic symphysis up to 12 mm. Mildly displaced fractures of the right superior and inferior pubic rami. IMPRESSION: 1. Acute mildly displaced fractures of the right superior and inferior pubic rami 2. Mild widening/diastasis of the pubic symphysis up to 12 mm. Electronically Signed   By: Donavan Foil M.D.   On: 09/27/2016 20:18    EKG: No orders found for this or any previous visit.  IMPRESSION AND PLAN: 83 year old female patient with history of hypertension, hyperlipidemia, type 2 diabetes mellitus, right foot drop had a fall. Admitting diagnosis 1. Right superior inferior pubic ramus fracture 2. Right hip pain 2. Accidental fall 3. Hypertension 4. Hyperlipidemia Treatment plan Admit patient to medical floor observation bed Pain management with oral Percocet and IV morphine Physical therapy evaluation in the morning Weightbearing once pain is tolerated Resume home medications for hypertension and hyperlipidemia DVT prophylaxis with subcutaneous Lovenox 40 MG daily Supportive care  All the records are reviewed and case discussed with ED provider. Management plans discussed with the patient, family and they are in agreement.  CODE STATUS:FULL CODE Surrogate decision maker : Daughter    Code Status Orders        Start     Ordered   09/27/16 2339  Full code  Continuous     09/27/16 2338    Code Status History  Date Active Date Inactive Code Status Order ID Comments User Context   This patient has a current code status but no historical code status.    Advance Directive Documentation     Most Recent Value  Type of Advance Directive  Living will  Pre-existing out of facility DNR order (yellow form or pink  MOST form)  -  "MOST" Form in Place?  -       TOTAL TIME TAKING CARE OF THIS PATIENT: 50 minutes.    Saundra Shelling M.D on 09/27/2016 at 11:59 PM  Between 7am to 6pm - Pager - [phone]  After 6pm go to [email or web address] - password [email or web address] [place] [facility]  Office  [phone]  CC: Primary care physician; Mar Daring, PA-C

## 2016-09-27 NOTE — ED Provider Notes (Signed)
[facility] Emergency Department Provider Note  Time seen: 10:20 PM  I have reviewed the triage vital signs and the nursing notes.   HISTORY  Chief Complaint Fall    HPI Robin Ashley is a 83 y.o. female with a past medical history of diabetes, hypertension, hyperlipidemia, CK D, presents to the emergency department after a fall. According to the patient she fell after tripping on a curb landing on her right side. Patient was able to get up and get inside but states significant pain to the pelvis worse with any ambulation. Patient denies any other injuries. Denies any head injuries or loss of consciousness. Denies any chest pain, abdominal pain, vomiting or diarrhea. Patient denies any recent weakness, fever, dysuria, cough congestion. Describes her pain as mild dull pain in the pelvis, severe with any attempted movement.  Past Medical History:  Diagnosis Date  . 174.4 03/2013   Left breast, T1c, N0, 12 mm; ER PR positive, HER-2/neu not over expressed. Not a candidate for adjuvant chemotherapy per [facility] tumor board.  . Diabetes mellitus without complication ([place])   . Foot drop, right October 2014   noted post mastectomy, conservative treatment was instituted with resolution, mild edema.  . Hyperlipidemia   . Hypertension     Patient Active Problem List   Diagnosis Date Noted  . Lymphedema 08/21/2016  . Osteoarthritis of left shoulder 12/22/2015  . Paresthesia and pain of right extremity 06/19/2015  . Diverticulosis 02/17/2015  . Adaptation reaction 02/16/2015  . At risk for falling 02/16/2015  . Benign hypertension with CKD (chronic kidney disease) stage III 02/16/2015  . Adult BMI 30+ 02/16/2015  . Chronic kidney disease (CKD), stage III (moderate) 02/16/2015  . CN (constipation) 02/16/2015  . Diabetes ([place]) 02/16/2015  . Accumulation of fluid in tissues 02/16/2015  . H/O gastric ulcer 02/16/2015  . Hypercholesteremia 02/16/2015  . Insomnia  02/16/2015  . Gonalgia 02/16/2015  . Cramps of lower extremity 02/16/2015  . Fungal infection of toenail 02/16/2015  . Pain in shoulder 02/16/2015  . Arthritis 11/22/2014  . Helicobacter pylori gastrointestinal tract infection 05/18/2014  . Anemia, iron deficiency 03/27/2014  . Bilateral lower extremity edema 02/22/2014  . Breast cancer ([place]) 03/04/2013    Past Surgical History:  Procedure Laterality Date  . BREAST BIOPSY Left 02/25/13   positive  . BREAST SURGERY Left 03-15-13   left mastectomywith SN biopsy  . COLON SURGERY  1991   Likely segmental resection for diverticulitis based on patient description.  . COLONOSCOPY    . EYE SURGERY Bilateral 04/2012  . intestinal balloon removed  1991   Likely surgery for diverticulitis.  Marland Kitchen MASTECTOMY Left 2014   positive  . 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    Prior to Admission medications   Medication Sig Start Date End Date Taking? Authorizing Provider  amLODipine (NORVASC) 5 MG tablet  10/26/15   Historical Provider, MD  aspirin 81 MG tablet Take 1 tablet (81 mg total) by mouth daily. 06/19/15   Margarita Rana, MD  atorvastatin (LIPITOR) 80 MG tablet TAKE 1 TABLET DAILY 11/29/15   Margarita Rana, MD  ferrous sulfate ([facility] IRON) 325 (65 FE) MG tablet Take 1 tablet (325 mg total) by mouth 2 (two) times daily. 12/21/15   Mar Daring, PA-C  furosemide (LASIX) 20 MG tablet TAKE 1 TABLET DAILY 08/19/16   Mar Daring, PA-C  letrozole [facility]) 2.5 MG tablet TAKE 1 TABLET  DAILY 04/29/16   Mar Daring, PA-C  lisinopril (PRINIVIL,ZESTRIL) 20 MG tablet  10/26/15   Historical Provider, MD  meloxicam (MOBIC) 15 MG tablet TAKE 1 TABLET DAILY 04/29/16   Mar Daring, PA-C  metFORMIN (GLUCOPHAGE) 500 MG tablet TAKE 1 TABLET TWICE A DAY 11/29/15   Margarita Rana, MD  Multiple Vitamins-Minerals (CENTRUM SILVER PO) Take 1 tablet by mouth daily.    Historical Provider, MD   omeprazole (PRILOSEC) 20 MG capsule TAKE 1 CAPSULE DAILY 01/30/16   Robert Bellow, MD  polyethylene glycol powder [facility]) powder  03/24/14   Historical Provider, MD  traZODone (DESYREL) 50 MG tablet TAKE 1 TABLET EVERY EVENING 04/23/16   Mar Daring, PA-C    No Known Allergies  Family History  Problem Relation Age of Onset  . Cancer Brother     colon  . Diabetes Brother   . Heart disease Brother   . Diabetes Brother   . Breast cancer Neg Hx     Social History Social History  Substance Use Topics  . Smoking status: Never Smoker  . Smokeless tobacco: Never Used  . Alcohol use No    Review of Systems Constitutional: Negative for fever. Eyes: Negative for visual changes. ENT: Negative for congestion Cardiovascular: Negative for chest pain. Respiratory: Negative for shortness of breath. Gastrointestinal: Negative for abdominal pain, vomiting  Genitourinary: Negative for dysuria. Musculoskeletal: Right hip/pelvis pain Skin: Negative for rash. Neurological: Negative for headache All other ROS negative  ____________________________________________   PHYSICAL EXAM:  VITAL SIGNS: ED Triage Vitals  Enc Vitals Group     BP 09/27/16 2024 (!) 165/74     Pulse Rate 09/27/16 2024 79     Resp 09/27/16 2024 18     Temp 09/27/16 2024 98.5 F (36.9 C)     Temp Source 09/27/16 2024 Oral     SpO2 09/27/16 2024 96 %     Weight 09/27/16 1953 220 lb (99.8 kg)     Height 09/27/16 1953 5' 7"  (1.702 m)     Head Circumference --      Peak Flow --      Pain Score 09/27/16 1952 0     Pain Loc --      Pain Edu? --      Excl. in [place]? --     Constitutional: Alert and oriented. Well appearing and in no distress. Eyes: Normal exam ENT   Head: Normocephalic and atraumatic.   Mouth/Throat: Mucous membranes are moist. Cardiovascular: Normal rate, regular rhythm.  Respiratory: Normal respiratory effort without tachypnea nor retractions. Breath sounds are  clear  Gastrointestinal: Soft and nontender. No distention.   Musculoskeletal: Nontender right hip to palpation. Moderate pain to the right hip/right groin with attempted range of motion of the right hip. 2+ DP pulse, sensation is normal. Neurologic:  Normal speech and language. No gross focal neurologic deficits  Skin:  Skin is warm, dry and intact.  Psychiatric: Mood and affect are normal. Speech and behavior are normal.   ____________________________________________     RADIOLOGY   IMPRESSION: 1. Acute mildly displaced fractures of the right superior and inferior pubic rami 2. Mild widening/diastasis of the pubic symphysis up to 12 mm.  ____________________________________________   INITIAL IMPRESSION / ASSESSMENT AND PLAN / ED COURSE  Pertinent labs & imaging results that were available during my care of the patient were reviewed by me and considered in my medical decision making (see chart for details).  Patient presented to the emergency  department after mechanical fall. Exam is concerning for possible pelvic fracture. We will obtain x-rays to continue to closely monitor. Patient denies any pain as long she is lying still.  X-ray consistent with superior and inferior pubic gram I fracture with mild widening of the pubic symphysis. Given the mild widening of the pubic symphysis I discussed the patient with Dr. Mack Guise, who has ordered a CT scan of the pelvis for the patient. He states this is likely nonsurgical management but as the patient is in significant pain with any attempt at range of motion and cannot care for herself we will admit to the hospital for pain control and possible short-term rehabilitation.  ____________________________________________   FINAL CLINICAL IMPRESSION(S) / ED DIAGNOSES  Pelvic fracture    Harvest Dark, MD 09/27/16 2225

## 2016-09-27 NOTE — Consult Note (Signed)
Called by Dr. Nelva Bush regarding this 83 year old female who sustained a fall earlier today.  Was reported by EMS to be able to ambulate after the fall.  Has been noted to have superior and inferior pubic rami fractures which are minimally displaced.  There is a question of slight widening of the pubic symphysis.  A full consult will follow in the AM.   VITALS:   Vitals:   09/27/16 1953 09/27/16 2024  BP:  (!) 165/74  Pulse:  79  Resp:  18  Temp:  98.5 F (36.9 C)  TempSrc:  Oral  SpO2:  96%  Weight: 99.8 kg (220 lb)   Height: 5\' 7"  (1.702 m)      Dg Hip Unilat  With Pelvis 2-3 Views Right  Result Date: 09/27/2016 CLINICAL DATA:  [place] on the hip, pain EXAM: DG HIP (WITH OR WITHOUT PELVIS) 2-3V RIGHT COMPARISON:  None. FINDINGS: The femoral heads appear normally positioned. There is slight widening of the pubic symphysis up to 12 mm. Mildly displaced fractures of the right superior and inferior pubic rami. IMPRESSION: 1. Acute mildly displaced fractures of the right superior and inferior pubic rami 2. Mild widening/diastasis of the pubic symphysis up to 12 mm. Electronically Signed   By: Donavan Foil M.D.   On: 09/27/2016 20:18       Active Problems: Right superior inferior rami fractures with possible widening of the syndesmosis  I have ordered a CT scan of the pelvis as the sacrum and SI joints are obscured on her plain film.  It appears that this patient has isolated superior and inferior pubic rami fractures which will be treated non-operatively.   She will be PWB on the right lower extremity as her pain will allow.  Recommend patient is admitted to medicine and undergo a PT evaluation in the AM.  She may require SNF placement.  Recheck CBC in the AM for possible anemia secondary to hematoma after pelvic fracture.  Patient has stable vital signs currently.    Thornton Park , MD 09/27/2016, 9:48 PM

## 2016-09-28 DIAGNOSIS — W101XXA Fall (on)(from) sidewalk curb, initial encounter: Secondary | ICD-10-CM | POA: Diagnosis not present

## 2016-09-28 DIAGNOSIS — I10 Essential (primary) hypertension: Secondary | ICD-10-CM | POA: Diagnosis not present

## 2016-09-28 DIAGNOSIS — M25551 Pain in right hip: Secondary | ICD-10-CM | POA: Diagnosis not present

## 2016-09-28 DIAGNOSIS — S32810A Multiple fractures of pelvis with stable disruption of pelvic ring, initial encounter for closed fracture: Secondary | ICD-10-CM | POA: Diagnosis not present

## 2016-09-28 DIAGNOSIS — S32511A Fracture of superior rim of right pubis, initial encounter for closed fracture: Secondary | ICD-10-CM | POA: Diagnosis not present

## 2016-09-28 DIAGNOSIS — S32599A Other specified fracture of unspecified pubis, initial encounter for closed fracture: Secondary | ICD-10-CM | POA: Diagnosis not present

## 2016-09-28 LAB — CBC
HCT: 34.8 % — ABNORMAL LOW (ref 35.0–47.0)
HEMATOCRIT: 33.8 % — AB (ref 35.0–47.0)
HEMOGLOBIN: 11.5 g/dL — AB (ref 12.0–16.0)
Hemoglobin: 11.4 g/dL — ABNORMAL LOW (ref 12.0–16.0)
MCH: 31.5 pg (ref 26.0–34.0)
MCH: 30.6 pg (ref 26.0–34.0)
MCHC: 32.8 g/dL (ref 32.0–36.0)
MCHC: 34 g/dL (ref 32.0–36.0)
MCV: 92.6 fL (ref 80.0–100.0)
MCV: 93.2 fL (ref 80.0–100.0)
PLATELETS: 264 10*3/uL (ref 150–440)
Platelets: 273 10*3/uL (ref 150–440)
RBC: 3.66 MIL/uL — AB (ref 3.80–5.20)
RBC: 3.73 MIL/uL — AB (ref 3.80–5.20)
RDW: 13.8 % (ref 11.5–14.5)
RDW: 14 % (ref 11.5–14.5)
WBC: 11.3 10*3/uL — ABNORMAL HIGH (ref 3.6–11.0)
WBC: 14.5 10*3/uL — ABNORMAL HIGH (ref 3.6–11.0)

## 2016-09-28 LAB — BASIC METABOLIC PANEL
Anion gap: 8 (ref 5–15)
Anion gap: 8 (ref 5–15)
BUN: 11 mg/dL (ref 6–20)
BUN: 11 mg/dL (ref 6–20)
CHLORIDE: 107 mmol/L (ref 101–111)
CO2: 26 mmol/L (ref 22–32)
CO2: 26 mmol/L (ref 22–32)
Calcium: 8.7 mg/dL — ABNORMAL LOW (ref 8.9–10.3)
Calcium: 8.9 mg/dL (ref 8.9–10.3)
Chloride: 106 mmol/L (ref 101–111)
Creatinine, Ser: 1.03 mg/dL — ABNORMAL HIGH (ref 0.44–1.00)
Creatinine, Ser: 1.05 mg/dL — ABNORMAL HIGH (ref 0.44–1.00)
GFR calc Af Amer: 56 mL/min — ABNORMAL LOW (ref >60)
GFR calc Af Amer: 57 mL/min — ABNORMAL LOW (ref >60)
GFR, EST NON AFRICAN AMERICAN: 48 mL/min — AB (ref >60)
GFR, EST NON AFRICAN AMERICAN: 49 mL/min — AB (ref >60)
GLUCOSE: 131 mg/dL — AB (ref 65–99)
GLUCOSE: 138 mg/dL — AB (ref 65–99)
POTASSIUM: 3.4 mmol/L — AB (ref 3.5–5.1)
POTASSIUM: 3.6 mmol/L (ref 3.5–5.1)
Sodium: 140 mmol/L (ref 135–145)
Sodium: 141 mmol/L (ref 135–145)

## 2016-09-28 MED ORDER — POTASSIUM CHLORIDE 20 MEQ PO PACK
40.0000 meq | PACK | Freq: Once | ORAL | Status: AC
  Administered 2016-09-28: 40 meq via ORAL
  Filled 2016-09-28: qty 2

## 2016-09-28 NOTE — Care Management Note (Addendum)
Case Management Note  Patient Details  Name: Robin Ashley MRN: [id number] Date of Birth: [birth year]  Subjective/Objective:     Discussed discharge planning for Mrs Tuwanna Krausz who is an Observation patient with Medicare and Tricare insurance, with weekend SW. The weekend SWer agrees that according to Medicare guidelines, that an Observation patient is not covered to be discharged directly from a hospital to a Rehab facility. Mrs Arrighi is the spouse of a [employer], and does not have a 70% service connected disability herself, and is therefore does not qualify for a [place] hospitalization as an Observation patient.  Ms Pepitone's daughter was informed of the information listed above. Ms Smiths daughter Andris Flurry requested and was provided with a list of local Rehab facilities so that she can discuss private pay costs for Rehab with them. .                  Action/Plan:   Expected Discharge Date:  09/30/16               Expected Discharge Plan:     In-House Referral:     Discharge planning Services     Post Acute Care Choice:    Choice offered to:     DME Arranged:    DME Agency:     HH Arranged:    HH Agency:     Status of Service:     If discussed at [employer], dates discussed:    Additional Comments:  Antonela Freiman A, RN 09/28/2016, 2:15 PM

## 2016-09-28 NOTE — Care Management Obs Status (Signed)
[place] NOTIFICATION   Patient Details  Name: TEDDI BADALAMENTI MRN: [id number] Date of Birth: Apr 22, 1932   Medicare Observation Status Notification Given:  Yes (MOON letter given. Discussed with daughter.)    Mardene Speak, RN 09/28/2016, 2:13 PM

## 2016-09-28 NOTE — Care Management Note (Addendum)
Case Management Note  Patient Details  Name: Robin Ashley MRN: [id number] Date of Birth: July 18, 1931  Subjective/Objective:     Discussed discharge planning with daughter Amado Nash [phone]. Advised Ms Sabra Heck that her Mother, Robin Ashley, is an Observation patient and that normally patient's with pelvic fractures are discharged home with home health services. Ms Sabra Heck stated that she was told by Dr Estanislado Pandy that Robin Cwikla would not be discharged until Monday or Tuesday, and that because she has two pelvic fractures she would be discharged to Rehab. This Probation officer advised daughter that Robin Neils is an Observation patient under Medicare definition Observation patients are not eligible to be discharged from the hospital directly to a Rehab facility. This Probation officer will consult with SW to discuss whether there is any possibility that Robin Mckenny can be discharged to Rehab.            Action/Plan:   Expected Discharge Date:  09/30/16               Expected Discharge Plan:     In-House Referral:     Discharge planning Services     Post Acute Care Choice:    Choice offered to:     DME Arranged:    DME Agency:     HH Arranged:    HH Agency:     Status of Service:     If discussed at [employer], dates discussed:    Additional Comments:  Shanise Balch A, RN 09/28/2016, 1:16 PM

## 2016-09-28 NOTE — Consult Note (Addendum)
ORTHOPAEDIC CONSULTATION  REQUESTING PHYSICIAN: Henreitta Leber, MD  Chief Complaint: Right hip pain status post fall  HPI: Robin Ashley is a 83 y.o. female who complains of  right hip pain after a fall yesterday. Patient states she was walking alongside her car next to the curb. She tripped on the curb and fell on to her right side. Patient was initially able to stand after her fall but had progressive pain and worsening function. Her daughter had to call EMS to take her to the hospital and eventually she can no longer place weight on her right lower extremity. Patient was found to have fractures of her right superior and inferior rami with widening of the pubic symphysis on her plain films and CT scan in the ER. Patient was admitted to the medical service for pain management and observation.  The orthopedics is consulted for management of her pelvic fractures.  Patient is sitting in her hospital bed today. She complains of pain in her right hip/pelvis. Patient denies numbness, tingling, or weakness in her right lower extremity.  Past Medical History:  Diagnosis Date  . 174.4 03/2013   Left breast, T1c, N0, 12 mm; ER PR positive, HER-2/neu not over expressed. Not a candidate for adjuvant chemotherapy per [facility] tumor board.  . Diabetes mellitus without complication ([place])   . Foot drop, right October 2014   noted post mastectomy, conservative treatment was instituted with resolution, mild edema.  . Hyperlipidemia   . Hypertension    Past Surgical History:  Procedure Laterality Date  . BREAST BIOPSY Left 02/25/13   positive  . BREAST SURGERY Left 03-15-13   left mastectomywith SN biopsy  . COLON SURGERY  1991   Likely segmental resection for diverticulitis based on patient description.  . COLONOSCOPY    . EYE SURGERY Bilateral 04/2012  . intestinal balloon removed  1991   Likely surgery for diverticulitis.  Marland Kitchen MASTECTOMY Left 2014   positive  . 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   Social History   Social History  . Marital status: Widowed    Spouse name: N/A  . Number of children: N/A  . Years of education: N/A   Occupational History  . retired    Social History Main Topics  . Smoking status: Never Smoker  . Smokeless tobacco: Never Used  . Alcohol use No  . Drug use: No  . Sexual activity: No   Other Topics Concern  . None   Social History Narrative  . None   Family History  Problem Relation Age of Onset  . Cancer Brother     colon  . Diabetes Brother   . Heart disease Brother   . Diabetes Brother   . Breast cancer Neg Hx    No Known Allergies Prior to Admission medications   Medication Sig Start Date End Date Taking? Authorizing Provider  amLODipine (NORVASC) 5 MG tablet  10/26/15  Yes Historical Provider, MD  aspirin 81 MG tablet Take 1 tablet (81 mg total) by mouth daily. 06/19/15  Yes Margarita Rana, MD  atorvastatin (LIPITOR) 80 MG tablet TAKE 1 TABLET DAILY 11/29/15  Yes Margarita Rana, MD  ferrous sulfate ([facility] IRON) 325 (65 FE) MG tablet Take 1 tablet (325 mg total) by mouth 2 (two) times daily. 12/21/15  Yes Clearnce Sorrel Burnette, PA-C  furosemide (LASIX) 20 MG tablet TAKE 1 TABLET DAILY 08/19/16  Yes Mar Daring, PA-C  letrozole (  FEMARA) 2.5 MG tablet TAKE 1 TABLET DAILY 04/29/16  Yes Clearnce Sorrel Burnette, PA-C  lisinopril (PRINIVIL,ZESTRIL) 20 MG tablet  10/26/15  Yes Historical Provider, MD  meloxicam (MOBIC) 15 MG tablet TAKE 1 TABLET DAILY 04/29/16  Yes Clearnce Sorrel Burnette, PA-C  metFORMIN (GLUCOPHAGE) 500 MG tablet TAKE 1 TABLET TWICE A DAY 11/29/15  Yes Margarita Rana, MD  Multiple Vitamins-Minerals (CENTRUM SILVER PO) Take 1 tablet by mouth daily.   Yes Historical Provider, MD  omeprazole (PRILOSEC) 20 MG capsule TAKE 1 CAPSULE DAILY 01/30/16  Yes Robert Bellow, MD  polyethylene glycol powder (GLYCOLAX/MIRALAX) powder  03/24/14  Yes Historical Provider, MD  traZODone  (DESYREL) 50 MG tablet TAKE 1 TABLET EVERY EVENING 04/23/16  Yes Clearnce Sorrel Burnette, PA-C   Ct Pelvis Wo Contrast  Result Date: 09/27/2016 CLINICAL DATA:  Right hip pain after fall today EXAM: CT PELVIS WITHOUT CONTRAST TECHNIQUE: Multidetector CT imaging of the pelvis was performed following the standard protocol without intravenous contrast. COMPARISON:  None. FINDINGS: Urinary Tract: Exophytic lesions of the visualized right kidney are noted two which are not simple cysts but may represent complex cysts measuring 2.7 cm in diameter and 1 cm respectively along the lateral as well as medial aspect of the kidney. A larger exophytic 3.4 cm simple cyst is noted with water attenuation anteriorly. These can be further correlated with nonemergent ultrasound as deemed clinically necessary. Bowel:  No acute bowel obstruction or inflammation. Vascular/Lymphatic: Aortoiliac atherosclerosis without aneurysm. No lymphadenopathy. Reproductive: Uterus and ovaries are visualized and demonstrate no acute appearing abnormalities. Idiopathic calcifications are noted of the ovaries. Other:  No free air or free fluid. Musculoskeletal: Acute minimally displaced fractures of the right superior pubic ramus near the pubic symphysis and of the mid inferior pubic ramus. Both hip joints maintained. 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 Grade 1 anterolisthesis of L4 on L5 likely on the basis of degenerative facet arthropathy with degenerative disc disease. IMPRESSION: 1. Acute minimally displaced fractures of the right superior pubic ramus near the pubic symphysis and of the mid inferior pubic ramus. 2. 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 3. Grade 1 anterolisthesis of L4 on L5 with degenerative disc disease. 4. Simple and likely complex right renal cysts which could be further correlated with nonemergent ultrasound or dedicated CT without and with IV contrast as deemed clinically necessary.  Electronically Signed   By: Ashley Royalty M.D.   On: 09/27/2016 22:44   Dg Hip Unilat  With Pelvis 2-3 Views Right  Result Date: 09/27/2016 CLINICAL DATA:  [place] on the hip, pain EXAM: DG HIP (WITH OR WITHOUT PELVIS) 2-3V RIGHT COMPARISON:  None. FINDINGS: The femoral heads appear normally positioned. There is slight widening of the pubic symphysis up to 12 mm. Mildly displaced fractures of the right superior and inferior pubic rami. IMPRESSION: 1. Acute mildly displaced fractures of the right superior and inferior pubic rami 2. Mild widening/diastasis of the pubic symphysis up to 12 mm. Electronically Signed   By: Donavan Foil M.D.   On: 09/27/2016 20:18    Positive ROS: All other systems have been reviewed and were otherwise negative with the exception of those mentioned in the HPI and as above.  Physical Exam: General: Alert, no acute distress, she has pain in the right hip/pelvis  Patient is afebrile.  BP 162/77.  HR 98, O2 sat is 93%.  MUSCULOSKELETAL: Right lower extremity: Patient has tenderness over the pubis on the right side. She has  no pain over the lateral right hip. She has significant pain with attempted active hip flexion.  She did not have pain with logrolling of the right hip. Patient has palpable pedal pulses, intact sensation throughout the right lower extremity and intact motor function distally.  Assessment: Right superior and inferior rami fractures with widening of the pubic symphysis  Plan: I explained to the patient and her daughter by phone, that the patient has sustained fractures in her superior and inferior rami on the right side. There is some widening of the pubic symphysis which may indicate instability of this joint.  Patient so far remains hemodynamically stable.  Patient has considerable pain in the right hip/pelvis. Continue current pain management with narcotics.  Patient is hypokalemic and I have ordered potassium for her.  Patient will have a CBC and BMP  rechecked tomorrow. He shouldn't will continue to be evaluated for the development of pelvic hematoma. I have ordered physical and occupational therapy evaluations for the patient.  Patient may only with partially weightbearing on her right lower extremity as her pain allows given the fractures and possible instability of the pubic symphysis. We'll continue to follow. I've spoken with the patient and her daughter to explain the plan. They were in agreement with the plan outlined above. I have also discussed this case with the case managers. Patient would and if it from a skilled nursing facility and she will have difficulty ambulating and I believe is a high fall risk.  Patient lives by herself at home. Patient is on lovenox for DVT prophylaxis.    Thornton Park, MD    09/28/2016 2:48 PM

## 2016-09-28 NOTE — Progress Notes (Signed)
[facility] NAME: Makaelyn Aponte    MR#:  [id number]  DATE OF BIRTH:  [birth year]  SUBJECTIVE:   Patient here after a mechanical fall and noted to have a Acute minimally displaced fractures of the right superior pubic ramus near the pubic symphysis and of the mid inferior pubic ramus.    Still having some pain in the hip area on minimal ambulation. Awaiting physical therapy evaluation. Other acute complaints presently.  REVIEW OF SYSTEMS:    Review of Systems  Constitutional: Negative for fever and weight loss.  HENT: Negative for congestion, nosebleeds and tinnitus.   Eyes: Negative for blurred vision, double vision and redness.  Respiratory: Negative for cough, hemoptysis and shortness of breath.   Cardiovascular: Negative for chest pain, orthopnea, leg swelling and PND.  Gastrointestinal: Negative for abdominal pain, diarrhea, melena, nausea and vomiting.  Genitourinary: Negative for dysuria, hematuria and urgency.  Musculoskeletal: Positive for falls and joint pain (Hip Pain).  Neurological: Negative for dizziness, tingling, sensory change, focal weakness, seizures, weakness and headaches.  Endo/Heme/Allergies: Negative for polydipsia. Does not bruise/bleed easily.  Psychiatric/Behavioral: Negative for depression and memory loss. The patient is not nervous/anxious.     Nutrition: heart healthy Tolerating Diet: Yes Tolerating PT: Await Eval.   DRUG ALLERGIES:  No Known Allergies  VITALS:  Blood pressure (!) 152/63, pulse 89, temperature 98.7 F (37.1 C), temperature source Oral, resp. rate 16, height 5\' 7"  (1.702 m), weight 100.6 kg (221 lb 11.2 oz), SpO2 93 %.  PHYSICAL EXAMINATION:   Physical Exam  GENERAL:  83 y.o.-year-old patient lying in bed in no acute distress.  EYES: Pupils equal, round, reactive to light and accommodation. No scleral icterus. Extraocular muscles intact.  HEENT: Head atraumatic,  normocephalic. Oropharynx and nasopharynx clear.  NECK:  Supple, no jugular venous distention. No thyroid enlargement, no tenderness.  LUNGS: Normal breath sounds bilaterally, no wheezing, rales, rhonchi. No use of accessory muscles of respiration.  CARDIOVASCULAR: S1, S2 normal. No murmurs, rubs, or gallops.  ABDOMEN: Soft, nontender, nondistended. Bowel sounds present. No organomegaly or mass.  EXTREMITIES: No cyanosis, clubbing or edema b/l.    NEUROLOGIC: Cranial nerves II through XII are intact. No focal Motor or sensory deficits b/l.   PSYCHIATRIC: The patient is alert and oriented x 3.  SKIN: No obvious rash, lesion, or ulcer.    LABORATORY PANEL:   CBC  Recent Labs Lab 09/28/16 0420  WBC 11.3*  HGB 11.5*  HCT 33.8*  PLT 273   ------------------------------------------------------------------------------------------------------------------  Chemistries   Recent Labs Lab 09/26/16 1509  09/28/16 0420  NA 143  < > 141  K 4.1  < > 3.4*  CL 103  < > 107  CO2 23  < > 26  GLUCOSE 104*  < > 138*  BUN 10  < > 11  CREATININE 1.09*  < > 1.03*  CALCIUM 9.2  < > 8.9  AST 15  --   --   ALT 9  --   --   ALKPHOS 163*  --   --   BILITOT 0.3  --   --   < > = values in this interval not displayed. ------------------------------------------------------------------------------------------------------------------  Cardiac Enzymes No results for input(s): TROPONINI in the last 168 hours. ------------------------------------------------------------------------------------------------------------------  RADIOLOGY:  Ct Pelvis Wo Contrast  Result Date: 09/27/2016 CLINICAL DATA:  Right hip pain after fall today EXAM: CT PELVIS WITHOUT CONTRAST TECHNIQUE: Multidetector CT imaging of the pelvis was performed  following the standard protocol without intravenous contrast. COMPARISON:  None. FINDINGS: Urinary Tract: Exophytic lesions of the visualized right kidney are noted two which are  not simple cysts but may represent complex cysts measuring 2.7 cm in diameter and 1 cm respectively along the lateral as well as medial aspect of the kidney. A larger exophytic 3.4 cm simple cyst is noted with water attenuation anteriorly. These can be further correlated with nonemergent ultrasound as deemed clinically necessary. Bowel:  No acute bowel obstruction or inflammation. Vascular/Lymphatic: Aortoiliac atherosclerosis without aneurysm. No lymphadenopathy. Reproductive: Uterus and ovaries are visualized and demonstrate no acute appearing abnormalities. Idiopathic calcifications are noted of the ovaries. Other:  No free air or free fluid. Musculoskeletal: Acute minimally displaced fractures of the right superior pubic ramus near the pubic symphysis and of the mid inferior pubic ramus. Both hip joints maintained. 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 Grade 1 anterolisthesis of L4 on L5 likely on the basis of degenerative facet arthropathy with degenerative disc disease. IMPRESSION: 1. Acute minimally displaced fractures of the right superior pubic ramus near the pubic symphysis and of the mid inferior pubic ramus. 2. 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 3. Grade 1 anterolisthesis of L4 on L5 with degenerative disc disease. 4. Simple and likely complex right renal cysts which could be further correlated with nonemergent ultrasound or dedicated CT without and with IV contrast as deemed clinically necessary. Electronically Signed   By: Ashley Royalty M.D.   On: 09/27/2016 22:44   Dg Hip Unilat  With Pelvis 2-3 Views Right  Result Date: 09/27/2016 CLINICAL DATA:  [place] on the hip, pain EXAM: DG HIP (WITH OR WITHOUT PELVIS) 2-3V RIGHT COMPARISON:  None. FINDINGS: The femoral heads appear normally positioned. There is slight widening of the pubic symphysis up to 12 mm. Mildly displaced fractures of the right superior and inferior pubic rami. IMPRESSION: 1. Acute mildly  displaced fractures of the right superior and inferior pubic rami 2. Mild widening/diastasis of the pubic symphysis up to 12 mm. Electronically Signed   By: Donavan Foil M.D.   On: 09/27/2016 20:18     ASSESSMENT AND PLAN:   83 year old female with past medical history of hypertension, hyperlipidemia, diabetes, history of a right foot drop or present to the hospital after a mechanical fall and noted to have a pelvic fracture.  1. Status post fall and pelvic fracture-patient had a CT scan of the hip and pelvis which showed Acute minimally displaced fractures of the right superior pubic ramus near the pubic symphysis and of the mid inferior pubic ramus. -Seen by orthopedics and these are non-operable. -Await physical therapy evaluation, continue pain control. Possible discharge to rehabilitation pending PT evaluation.  2. Essential hypertension-continue Norvasc, lisinopril.  3. Hyperlipidemia-continue atorvastatin.  4. History of breast cancer-continue Femara.  5. Diabetes type 2 without complication-into the metformin.  6. GERD-continue Protonix.   All the records are reviewed and case discussed with Care Management/Social Worker. Management plans discussed with the patient, family and they are in agreement.  CODE STATUS: Full  DVT Prophylaxis: Lovenox  TOTAL TIME TAKING CARE OF THIS PATIENT: 30 minutes.   POSSIBLE D/C IN 1-2 DAYS, DEPENDING ON CLINICAL CONDITION.   Henreitta Leber M.D on 09/28/2016 at 1:18 PM  Between 7am to 6pm - Pager - [phone]  After 6pm go to [email or web address] - Proofreader  [facility]  Office  [phone]  CC: Primary care physician; Mar Daring, PA-C

## 2016-09-28 NOTE — Clinical Social Work Note (Signed)
CSW spoke with [facility] and confirmed that this patient is under observation with traditional Medicare as primary insurance. Therefore, Medicare will not cover SNF. In addition, no PT eval has been entered denoting SNF as the recommendation. CSW will con't to follow for assistance if needed.   Santiago Bumpers, MSW, Latanya Presser [phone]

## 2016-09-29 DIAGNOSIS — S32599A Other specified fracture of unspecified pubis, initial encounter for closed fracture: Secondary | ICD-10-CM | POA: Diagnosis not present

## 2016-09-29 DIAGNOSIS — M25551 Pain in right hip: Secondary | ICD-10-CM | POA: Diagnosis not present

## 2016-09-29 DIAGNOSIS — I10 Essential (primary) hypertension: Secondary | ICD-10-CM | POA: Diagnosis not present

## 2016-09-29 DIAGNOSIS — S32511A Fracture of superior rim of right pubis, initial encounter for closed fracture: Secondary | ICD-10-CM | POA: Diagnosis not present

## 2016-09-29 DIAGNOSIS — W101XXA Fall (on)(from) sidewalk curb, initial encounter: Secondary | ICD-10-CM | POA: Diagnosis not present

## 2016-09-29 LAB — BASIC METABOLIC PANEL
ANION GAP: 9 (ref 5–15)
BUN: 16 mg/dL (ref 6–20)
CALCIUM: 8.5 mg/dL — AB (ref 8.9–10.3)
CO2: 24 mmol/L (ref 22–32)
Chloride: 104 mmol/L (ref 101–111)
Creatinine, Ser: 1.31 mg/dL — ABNORMAL HIGH (ref 0.44–1.00)
GFR, EST AFRICAN AMERICAN: 43 mL/min — AB (ref >60)
GFR, EST NON AFRICAN AMERICAN: 37 mL/min — AB (ref >60)
Glucose, Bld: 138 mg/dL — ABNORMAL HIGH (ref 65–99)
Potassium: 3.7 mmol/L (ref 3.5–5.1)
SODIUM: 137 mmol/L (ref 135–145)

## 2016-09-29 LAB — CBC
HEMATOCRIT: 33.2 % — AB (ref 35.0–47.0)
Hemoglobin: 11 g/dL — ABNORMAL LOW (ref 12.0–16.0)
MCH: 30.6 pg (ref 26.0–34.0)
MCHC: 33 g/dL (ref 32.0–36.0)
MCV: 92.5 fL (ref 80.0–100.0)
Platelets: 264 10*3/uL (ref 150–440)
RBC: 3.59 MIL/uL — ABNORMAL LOW (ref 3.80–5.20)
RDW: 13.8 % (ref 11.5–14.5)
WBC: 12.5 10*3/uL — AB (ref 3.6–11.0)

## 2016-09-29 MED ORDER — HYDROCODONE-ACETAMINOPHEN 5-325 MG PO TABS: 1.0000 | ORAL_TABLET | ORAL | 0 refills | Status: DC | PRN

## 2016-09-29 NOTE — Care Management Note (Signed)
Case Management Note  Patient Details  Name: Robin Ashley MRN: [id number] Date of Birth: 1931/09/27  Subjective/Objective:    Discussed discharge planning with daughter Robin Ashley. Ms Upshur already has a RW at home. A referral for home health PT was called to [facility] with request by daughter Robin Ashley that she be called by [facility] to schedule home health appointments.                 Action/Plan:   Expected Discharge Date:  09/30/16               Expected Discharge Plan:     In-House Referral:     Discharge planning Services     Post Acute Care Choice:    Choice offered to:     DME Arranged:    DME Agency:     HH Arranged:    HH Agency:     Status of Service:     If discussed at [employer], dates discussed:    Additional Comments:  Mireyah Chervenak A, RN 09/29/2016, 11:55 AM

## 2016-09-29 NOTE — Discharge Summary (Signed)
[facility] NAME: Robin Ashley    MR#:  [id number]  DATE OF BIRTH:  [birth year]  DATE OF ADMISSION:  09/27/2016 ADMITTING PHYSICIAN: Saundra Shelling, MD  DATE OF DISCHARGE: 09/29/2016  PRIMARY CARE PHYSICIAN: Mar Daring, PA-C    ADMISSION DIAGNOSIS:  Pelvic fracture ([place]) [S32.9XXA] Fall, initial encounter [id number]] Closed fracture of right pubis, unspecified portion of pubis, initial encounter ([place]) [S32.501A]  DISCHARGE DIAGNOSIS:  Active Problems:   Pubic ramus fracture ([place])   SECONDARY DIAGNOSIS:   Past Medical History:  Diagnosis Date  . 174.4 03/2013   Left breast, T1c, N0, 12 mm; ER PR positive, HER-2/neu not over expressed. Not a candidate for adjuvant chemotherapy per [facility] tumor board.  . Diabetes mellitus without complication ([place])   . Foot drop, right October 2014   noted post mastectomy, conservative treatment was instituted with resolution, mild edema.  . Hyperlipidemia   . Hypertension     HOSPITAL COURSE:   83 year old female with past medical history of hypertension, hyperlipidemia, diabetes, history of a right foot drop or present to the hospital after a mechanical fall and noted to have a pelvic fracture.  1. Status post fall and pelvic fracture-patient had a CT scan of the hip and pelvis which showed Acute minimally displaced fractures of the right superior pubic ramus near the pubic symphysis and of the mid inferior pubic ramus. -Seen by orthopedics and these are non-operable. Then recommended partial weightbearing on the right and ongoing physical therapy. Patient was seen by physical therapy and they've recommended home health services. -Patient is being arranged for home health physical therapy with pain control with some as needed Norco on follow-up with orthopedics as outpatient.  2. Essential hypertension- she will continue Norvasc, lisinopril.  3. Hyperlipidemia- she will continue  atorvastatin.  4. History of breast cancer- she will continue Femara.  5. Diabetes type 2 without complication - she will cont. Metformin.   6. GERD- she will cont. Omeprazole.   DISCHARGE CONDITIONS:   Stable.   CONSULTS OBTAINED:    DRUG ALLERGIES:  No Known Allergies  DISCHARGE MEDICATIONS:   Allergies as of 09/29/2016   No Known Allergies     Medication List    TAKE these medications   amLODipine 5 MG tablet Commonly known as:  NORVASC   aspirin 81 MG tablet Take 1 tablet (81 mg total) by mouth daily.   atorvastatin 80 MG tablet Commonly known as:  LIPITOR TAKE 1 TABLET DAILY   CENTRUM SILVER PO Take 1 tablet by mouth daily.   ferrous sulfate 325 (65 FE) MG tablet Commonly known as:  [facility] IRON Take 1 tablet (325 mg total) by mouth 2 (two) times daily.   furosemide 20 MG tablet Commonly known as:  LASIX TAKE 1 TABLET DAILY   HYDROcodone-acetaminophen 5-325 MG tablet Commonly known as:  NORCO/VICODIN Take 1-2 tablets by mouth every 4 (four) hours as needed for moderate pain.   letrozole 2.5 MG tablet Commonly known as:  FEMARA TAKE 1 TABLET DAILY   lisinopril 20 MG tablet Commonly known as:  PRINIVIL,ZESTRIL   meloxicam 15 MG tablet Commonly known as:  MOBIC TAKE 1 TABLET DAILY   metFORMIN 500 MG tablet Commonly known as:  GLUCOPHAGE TAKE 1 TABLET TWICE A DAY   omeprazole 20 MG capsule Commonly known as:  PRILOSEC TAKE 1 CAPSULE DAILY   polyethylene glycol powder powder Commonly known as:  GLYCOLAX/MIRALAX   traZODone 50 MG tablet  Commonly known as:  DESYREL TAKE 1 TABLET EVERY EVENING         DISCHARGE INSTRUCTIONS:   DIET:  Cardiac diet and Diabetic diet  DISCHARGE CONDITION:  Stable  ACTIVITY:  Activity as tolerated  OXYGEN:  Home Oxygen: No   Oxygen Delivery: room air  DISCHARGE LOCATION:  Home with Home Health PT   If you experience worsening of your admission symptoms, develop shortness of breath, life  threatening emergency, suicidal or homicidal thoughts you must seek medical attention immediately by calling 911 or calling your MD immediately  if symptoms less severe.  You Must read complete instructions/literature along with all the possible adverse reactions/side effects for all the Medicines you take and that have been prescribed to you. Take any new Medicines after you have completely understood and accpet all the possible adverse reactions/side effects.   Please note  You were cared for by a hospitalist during your hospital stay. If you have any questions about your discharge medications or the care you received while you were in the hospital after you are discharged, you can call the unit and asked to speak with the hospitalist on call if the hospitalist that took care of you is not available. Once you are discharged, your primary care physician will handle any further medical issues. Please note that NO REFILLS for any discharge medications will be authorized once you are discharged, as it is imperative that you return to your primary care physician (or establish a relationship with a primary care physician if you do not have one) for your aftercare needs so that they can reassess your need for medications and monitor your lab values.     Today   Still having some Right Hip Pain but improved. Seen by PT and they recommend Home health services and will d/c home with Home health PT.  Discussed plan of care with daughter at bedside.   VITAL SIGNS:  Blood pressure (!) 154/69, pulse 90, temperature 98.5 F (36.9 C), temperature source Oral, resp. rate 19, height _0  (1.702 m), weight 100.6 kg (221 lb 11.2 oz), SpO2 96 %.  I/O:    Intake/Output Summary (Last 24 hours) at 09/29/16 1230 Last data filed at 09/29/16 0900  Gross per 24 hour  Intake             1320 ml  Output                0 ml  Net             1320 ml    PHYSICAL EXAMINATION:  GENERAL:  83 y.o.-year-old patient  lying in the bed with no acute distress.  EYES: Pupils equal, round, reactive to light and accommodation. No scleral icterus. Extraocular muscles intact.  HEENT: Head atraumatic, normocephalic. Oropharynx and nasopharynx clear.  NECK:  Supple, no jugular venous distention. No thyroid enlargement, no tenderness.  LUNGS: Normal breath sounds bilaterally, no wheezing, rales,rhonchi. No use of accessory muscles of respiration.  CARDIOVASCULAR: S1, S2 normal. No murmurs, rubs, or gallops.  ABDOMEN: Soft, non-tender, non-distended. Bowel sounds present. No organomegaly or mass.  EXTREMITIES: No pedal edema, cyanosis, or clubbing.  NEUROLOGIC: Cranial nerves II through XII are intact. No focal motor or sensory defecits b/l.  PSYCHIATRIC: The patient is alert and oriented x 3.  SKIN: No obvious rash, lesion, or ulcer.   DATA REVIEW:   CBC  Recent Labs Lab 09/29/16 0353  WBC 12.5*  HGB 11.0*  HCT 33.2*  PLT 264    Chemistries   Recent Labs Lab 09/26/16 1509  09/29/16 0353  NA 143  < > 137  K 4.1  < > 3.7  CL 103  < > 104  CO2 23  < > 24  GLUCOSE 104*  < > 138*  BUN 10  < > 16  CREATININE 1.09*  < > 1.31*  CALCIUM 9.2  < > 8.5*  AST 15  --   --   ALT 9  --   --   ALKPHOS 163*  --   --   BILITOT 0.3  --   --   < > = values in this interval not displayed.  Cardiac Enzymes No results for input(s): TROPONINI in the last 168 hours.  Microbiology Results  No results found for this or any previous visit.  RADIOLOGY:  Ct Pelvis Wo Contrast  Result Date: 09/27/2016 CLINICAL DATA:  Right hip pain after fall today EXAM: CT PELVIS WITHOUT CONTRAST TECHNIQUE: Multidetector CT imaging of the pelvis was performed following the standard protocol without intravenous contrast. COMPARISON:  None. FINDINGS: Urinary Tract: Exophytic lesions of the visualized right kidney are noted two which are not simple cysts but may represent complex cysts measuring 2.7 cm in diameter and 1 cm respectively  along the lateral as well as medial aspect of the kidney. A larger exophytic 3.4 cm simple cyst is noted with water attenuation anteriorly. These can be further correlated with nonemergent ultrasound as deemed clinically necessary. Bowel:  No acute bowel obstruction or inflammation. Vascular/Lymphatic: Aortoiliac atherosclerosis without aneurysm. No lymphadenopathy. Reproductive: Uterus and ovaries are visualized and demonstrate no acute appearing abnormalities. Idiopathic calcifications are noted of the ovaries. Other:  No free air or free fluid. Musculoskeletal: Acute minimally displaced fractures of the right superior pubic ramus near the pubic symphysis and of the mid inferior pubic ramus. Both hip joints maintained. 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 Grade 1 anterolisthesis of L4 on L5 likely on the basis of degenerative facet arthropathy with degenerative disc disease. IMPRESSION: 1. Acute minimally displaced fractures of the right superior pubic ramus near the pubic symphysis and of the mid inferior pubic ramus. 2. 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 3. Grade 1 anterolisthesis of L4 on L5 with degenerative disc disease. 4. Simple and likely complex right renal cysts which could be further correlated with nonemergent ultrasound or dedicated CT without and with IV contrast as deemed clinically necessary. Electronically Signed   By: Ashley Royalty M.D.   On: 09/27/2016 22:44   Dg Hip Unilat  With Pelvis 2-3 Views Right  Result Date: 09/27/2016 CLINICAL DATA:  [place] on the hip, pain EXAM: DG HIP (WITH OR WITHOUT PELVIS) 2-3V RIGHT COMPARISON:  None. FINDINGS: The femoral heads appear normally positioned. There is slight widening of the pubic symphysis up to 12 mm. Mildly displaced fractures of the right superior and inferior pubic rami. IMPRESSION: 1. Acute mildly displaced fractures of the right superior and inferior pubic rami 2. Mild widening/diastasis of the  pubic symphysis up to 12 mm. Electronically Signed   By: Donavan Foil M.D.   On: 09/27/2016 20:18      Management plans discussed with the patient, family and they are in agreement.  CODE STATUS:     Code Status Orders        Start     Ordered   09/27/16 2339  Full code  Continuous     09/27/16 2338  Code Status History    Date Active Date Inactive Code Status Order ID Comments User Context   This patient has a current code status but no historical code status.    Advance Directive Documentation     Most Recent Value  Type of Advance Directive  Living will  Pre-existing out of facility DNR order (yellow form or pink MOST form)  -  "MOST" Form in Place?  -      TOTAL TIME TAKING CARE OF THIS PATIENT: 40 minutes.    Henreitta Leber M.D on 09/29/2016 at 12:30 PM  Between 7am to 6pm - Pager - [phone]  After 6pm go to [email or web address] - Proofreader  [facility]  Office  [phone]  CC: Primary care physician; Mar Daring, PA-C

## 2016-09-29 NOTE — Progress Notes (Signed)
Physical Therapy Evaluation Patient Details Name: BRITTAIN SMITHEY MRN: [id number] DOB: November 23, 1931 Today's Date: 09/29/2016   History of Present Illness  Patient is an 83 y.o. female admitted on 28 APR after sustaining minimally displaced fx of R superior/inferior pubic rami, secondary to falling when getting out of car. Determined to be non-operable. PMH includes HTN, HLD, DMII, and R ankle fx.  Clinical Impression  Patient admitted for above listed reasons. Previously independent at baseline and now PWB on R LE. On evaluation, patient demonstrates ModI with bed mobility and transfers; however, she struggles to maintain WB precautions. Upon standing, patient had to use restroom, ambulating to [facility] without pain but likely placing >50% weight through R LE, despite verbal cues/demonstration. PT educated patient and patient's family about importance of maintaining WB precaution to prevent further injury. Patient's daughter/son-in-law available to assist 2x/day, but patient may require supervision with OOB mobility at this time upon discharge. Patient has impairments of decreased UE/LE strength, hip ROM, and function and will continue to benefit from progressive PT to return to PLOF.    Follow Up Recommendations Home health PT;Supervision for mobility/OOB    Equipment Recommendations  Rolling walker with 5" wheels;3in1 (PT);Other (comment) (Raised toilet seat)    Recommendations for Other Services       Precautions / Restrictions Precautions Precautions: Fall Restrictions Weight Bearing Restrictions: Yes RLE Weight Bearing: Partial weight bearing RLE Partial Weight Bearing Percentage or Pounds: 50      Mobility  Bed Mobility Overal bed mobility: Independent             General bed mobility comments: Patient performed bed mobility independently.  Transfers Overall transfer level: Modified independent Equipment used: Rolling walker (2 wheeled)             General transfer  comment: Patient moves from sit to stand and stand to sit with RW, demonstrating difficulty maintaining WB precautions, despite verbal cues/demonstration.  Ambulation/Gait Ambulation/Gait assistance: Min assist Ambulation Distance (Feet): 20 Feet Assistive device: Rolling walker (2 wheeled)       General Gait Details: Patient ambulates 20' with toileting break on BSC, demonstrating difficulty maintaing WB precautions, despite verbal cues/demonstration. Instructed to push through UEs. Patient denied pain but demonstrated weakness in UEs to offload R LE.  Stairs            Wheelchair Mobility    Modified Rankin (Stroke Patients Only)       Balance Overall balance assessment: Needs assistance Sitting-balance support: No upper extremity supported Sitting balance-Leahy Scale: Good     Standing balance support: Bilateral upper extremity supported Standing balance-Leahy Scale: Fair                               Pertinent Vitals/Pain Pain Assessment: No/denies pain    Home Living Family/patient expects to be discharged to:: Private residence Living Arrangements: Alone Available Help at Discharge: Family;Available PRN/intermittently Type of Home: Apartment Home Access: Level entry     Home Layout: One level Home Equipment: None      Prior Function Level of Independence: Independent         Comments: Patient previously independent with occasional assistance from daughter/son-in-law.     Hand Dominance   Dominant Hand: Right    Extremity/Trunk Assessment   Upper Extremity Assessment Upper Extremity Assessment: Generalized weakness;RUE deficits/detail RUE Deficits / Details: Wearing a wrist brace for carpal tunnel    Lower Extremity Assessment Lower Extremity  Assessment: Generalized weakness;RLE deficits/detail;LLE deficits/detail RLE Deficits / Details: Grossly 4-/5, limited by pain RLE: Unable to fully assess due to pain LLE Deficits /  Details: Wearing a flexible knee brace, grossly 4/5       Communication   Communication: No difficulties  Cognition Arousal/Alertness: Awake/alert Behavior During Therapy: WFL for tasks assessed/performed Overall Cognitive Status: Within Functional Limits for tasks assessed                                        General Comments      Exercises     Assessment/Plan    PT Assessment Patient needs continued PT services  PT Problem List Decreased strength;Decreased activity tolerance;Decreased balance;Decreased mobility;Decreased knowledge of use of DME;Decreased safety awareness;Decreased knowledge of precautions;Pain       PT Treatment Interventions DME instruction;Gait training;Functional mobility training;Therapeutic activities;Therapeutic exercise;Balance training;Patient/family education    PT Goals (Current goals can be found in the Care Plan section)  Acute Rehab PT Goals Patient Stated Goal: "To climb a mountain" PT Goal Formulation: With patient Time For Goal Achievement: 10/13/16 Potential to Achieve Goals: Good    Frequency 7X/week   Barriers to discharge Decreased caregiver support      Co-evaluation               End of Session Equipment Utilized During Treatment: Gait belt Activity Tolerance: Patient tolerated treatment well Patient left: in bed;with call bell/phone within reach;with bed alarm set;with family/visitor present   PT Visit Diagnosis: Muscle weakness (generalized) (M62.81);History of falling (Z91.81);Difficulty in walking, not elsewhere classified (R26.2);Pain Pain - Right/Left: Right Pain - part of body: Hip    Time: 4970-2637 PT Time Calculation (min) (ACUTE ONLY): 30 min   Charges:   PT Evaluation $PT Eval Low Complexity: 1 Procedure     PT G Codes:   PT G-Codes **NOT FOR INPATIENT CLASS** Functional Assessment Tool Used: AM-PAC 6 Clicks Basic Mobility;Clinical judgement Functional Limitation: Mobility:  Walking and moving around Mobility: Walking and Moving Around Current Status (C5885): At least 40 percent but less than 60 percent impaired, limited or restricted Mobility: Walking and Moving Around Goal Status [phone]): At least 1 percent but less than 20 percent impaired, limited or restricted     Dorice Lamas, PT, DPT 09/29/2016, 11:26 AM

## 2016-09-29 NOTE — Progress Notes (Signed)
Patient discharging home. Family present at bedside. Instructions and prescriptions given to patient. Verbalized understanding. IV removed. Family to transport patient home when ready and dressed.

## 2016-09-30 DIAGNOSIS — D509 Iron deficiency anemia, unspecified: Secondary | ICD-10-CM | POA: Diagnosis not present

## 2016-09-30 DIAGNOSIS — M6281 Muscle weakness (generalized): Secondary | ICD-10-CM | POA: Diagnosis not present

## 2016-09-30 DIAGNOSIS — N183 Chronic kidney disease, stage 3 (moderate): Secondary | ICD-10-CM | POA: Diagnosis not present

## 2016-09-30 DIAGNOSIS — E119 Type 2 diabetes mellitus without complications: Secondary | ICD-10-CM | POA: Diagnosis not present

## 2016-09-30 DIAGNOSIS — E7800 Pure hypercholesterolemia, unspecified: Secondary | ICD-10-CM | POA: Diagnosis not present

## 2016-09-30 DIAGNOSIS — Z9181 History of falling: Secondary | ICD-10-CM | POA: Diagnosis not present

## 2016-09-30 DIAGNOSIS — K5790 Diverticulosis of intestine, part unspecified, without perforation or abscess without bleeding: Secondary | ICD-10-CM | POA: Diagnosis not present

## 2016-09-30 DIAGNOSIS — R2689 Other abnormalities of gait and mobility: Secondary | ICD-10-CM | POA: Diagnosis not present

## 2016-09-30 DIAGNOSIS — Z853 Personal history of malignant neoplasm of breast: Secondary | ICD-10-CM | POA: Diagnosis not present

## 2016-09-30 DIAGNOSIS — K219 Gastro-esophageal reflux disease without esophagitis: Secondary | ICD-10-CM | POA: Diagnosis not present

## 2016-09-30 DIAGNOSIS — Z7984 Long term (current) use of oral hypoglycemic drugs: Secondary | ICD-10-CM | POA: Diagnosis not present

## 2016-09-30 DIAGNOSIS — S32501D Unspecified fracture of right pubis, subsequent encounter for fracture with routine healing: Secondary | ICD-10-CM | POA: Diagnosis not present

## 2016-09-30 DIAGNOSIS — I129 Hypertensive chronic kidney disease with stage 1 through stage 4 chronic kidney disease, or unspecified chronic kidney disease: Secondary | ICD-10-CM | POA: Diagnosis not present

## 2016-09-30 DIAGNOSIS — I890 Lymphedema, not elsewhere classified: Secondary | ICD-10-CM | POA: Diagnosis not present

## 2016-10-01 ENCOUNTER — Telehealth: Payer: Self-pay

## 2016-10-01 NOTE — Telephone Encounter (Signed)
-----   Message from Mar Daring, PA-C sent at 10/01/2016 10:02 AM EDT ----- A1c improved to 6.6 from 6.8. Renal function is best it has been in 3 years. Continue working on hydration. WBC count remains slightly elevated which may be reactive due to viral infection. We can recheck in 4-8 weeks to see if any changes. Also can we see how patient is doing since her fall and pelvic fracture? Thanks.

## 2016-10-01 NOTE — Telephone Encounter (Signed)
Patient has been advised. KW 

## 2016-10-02 DIAGNOSIS — S32501D Unspecified fracture of right pubis, subsequent encounter for fracture with routine healing: Secondary | ICD-10-CM | POA: Diagnosis not present

## 2016-10-02 DIAGNOSIS — N183 Chronic kidney disease, stage 3 (moderate): Secondary | ICD-10-CM | POA: Diagnosis not present

## 2016-10-02 DIAGNOSIS — M6281 Muscle weakness (generalized): Secondary | ICD-10-CM | POA: Diagnosis not present

## 2016-10-02 DIAGNOSIS — I129 Hypertensive chronic kidney disease with stage 1 through stage 4 chronic kidney disease, or unspecified chronic kidney disease: Secondary | ICD-10-CM | POA: Diagnosis not present

## 2016-10-02 DIAGNOSIS — R2689 Other abnormalities of gait and mobility: Secondary | ICD-10-CM | POA: Diagnosis not present

## 2016-10-02 DIAGNOSIS — E119 Type 2 diabetes mellitus without complications: Secondary | ICD-10-CM | POA: Diagnosis not present

## 2016-10-03 DIAGNOSIS — N183 Chronic kidney disease, stage 3 (moderate): Secondary | ICD-10-CM | POA: Diagnosis not present

## 2016-10-03 DIAGNOSIS — I129 Hypertensive chronic kidney disease with stage 1 through stage 4 chronic kidney disease, or unspecified chronic kidney disease: Secondary | ICD-10-CM | POA: Diagnosis not present

## 2016-10-03 DIAGNOSIS — E119 Type 2 diabetes mellitus without complications: Secondary | ICD-10-CM | POA: Diagnosis not present

## 2016-10-03 DIAGNOSIS — R2689 Other abnormalities of gait and mobility: Secondary | ICD-10-CM | POA: Diagnosis not present

## 2016-10-03 DIAGNOSIS — M6281 Muscle weakness (generalized): Secondary | ICD-10-CM | POA: Diagnosis not present

## 2016-10-03 DIAGNOSIS — S32501D Unspecified fracture of right pubis, subsequent encounter for fracture with routine healing: Secondary | ICD-10-CM | POA: Diagnosis not present

## 2016-10-04 DIAGNOSIS — E119 Type 2 diabetes mellitus without complications: Secondary | ICD-10-CM | POA: Diagnosis not present

## 2016-10-04 DIAGNOSIS — N183 Chronic kidney disease, stage 3 (moderate): Secondary | ICD-10-CM | POA: Diagnosis not present

## 2016-10-04 DIAGNOSIS — S32501D Unspecified fracture of right pubis, subsequent encounter for fracture with routine healing: Secondary | ICD-10-CM | POA: Diagnosis not present

## 2016-10-04 DIAGNOSIS — M6281 Muscle weakness (generalized): Secondary | ICD-10-CM | POA: Diagnosis not present

## 2016-10-04 DIAGNOSIS — I129 Hypertensive chronic kidney disease with stage 1 through stage 4 chronic kidney disease, or unspecified chronic kidney disease: Secondary | ICD-10-CM | POA: Diagnosis not present

## 2016-10-04 DIAGNOSIS — R2689 Other abnormalities of gait and mobility: Secondary | ICD-10-CM | POA: Diagnosis not present

## 2016-10-07 DIAGNOSIS — S32591A Other specified fracture of right pubis, initial encounter for closed fracture: Secondary | ICD-10-CM | POA: Diagnosis not present

## 2016-10-07 DIAGNOSIS — S32511A Fracture of superior rim of right pubis, initial encounter for closed fracture: Secondary | ICD-10-CM | POA: Diagnosis not present

## 2016-10-08 DIAGNOSIS — R2689 Other abnormalities of gait and mobility: Secondary | ICD-10-CM | POA: Diagnosis not present

## 2016-10-08 DIAGNOSIS — E119 Type 2 diabetes mellitus without complications: Secondary | ICD-10-CM | POA: Diagnosis not present

## 2016-10-08 DIAGNOSIS — N183 Chronic kidney disease, stage 3 (moderate): Secondary | ICD-10-CM | POA: Diagnosis not present

## 2016-10-08 DIAGNOSIS — M6281 Muscle weakness (generalized): Secondary | ICD-10-CM | POA: Diagnosis not present

## 2016-10-08 DIAGNOSIS — S32501D Unspecified fracture of right pubis, subsequent encounter for fracture with routine healing: Secondary | ICD-10-CM | POA: Diagnosis not present

## 2016-10-08 DIAGNOSIS — I129 Hypertensive chronic kidney disease with stage 1 through stage 4 chronic kidney disease, or unspecified chronic kidney disease: Secondary | ICD-10-CM | POA: Diagnosis not present

## 2016-10-09 DIAGNOSIS — S32501D Unspecified fracture of right pubis, subsequent encounter for fracture with routine healing: Secondary | ICD-10-CM | POA: Diagnosis not present

## 2016-10-09 DIAGNOSIS — M6281 Muscle weakness (generalized): Secondary | ICD-10-CM | POA: Diagnosis not present

## 2016-10-09 DIAGNOSIS — N183 Chronic kidney disease, stage 3 (moderate): Secondary | ICD-10-CM | POA: Diagnosis not present

## 2016-10-09 DIAGNOSIS — E119 Type 2 diabetes mellitus without complications: Secondary | ICD-10-CM | POA: Diagnosis not present

## 2016-10-09 DIAGNOSIS — R2689 Other abnormalities of gait and mobility: Secondary | ICD-10-CM | POA: Diagnosis not present

## 2016-10-09 DIAGNOSIS — I129 Hypertensive chronic kidney disease with stage 1 through stage 4 chronic kidney disease, or unspecified chronic kidney disease: Secondary | ICD-10-CM | POA: Diagnosis not present

## 2016-10-10 DIAGNOSIS — M6281 Muscle weakness (generalized): Secondary | ICD-10-CM | POA: Diagnosis not present

## 2016-10-10 DIAGNOSIS — R2689 Other abnormalities of gait and mobility: Secondary | ICD-10-CM | POA: Diagnosis not present

## 2016-10-10 DIAGNOSIS — S32501D Unspecified fracture of right pubis, subsequent encounter for fracture with routine healing: Secondary | ICD-10-CM | POA: Diagnosis not present

## 2016-10-10 DIAGNOSIS — E119 Type 2 diabetes mellitus without complications: Secondary | ICD-10-CM | POA: Diagnosis not present

## 2016-10-10 DIAGNOSIS — I129 Hypertensive chronic kidney disease with stage 1 through stage 4 chronic kidney disease, or unspecified chronic kidney disease: Secondary | ICD-10-CM | POA: Diagnosis not present

## 2016-10-10 DIAGNOSIS — N183 Chronic kidney disease, stage 3 (moderate): Secondary | ICD-10-CM | POA: Diagnosis not present

## 2016-10-11 DIAGNOSIS — S32501D Unspecified fracture of right pubis, subsequent encounter for fracture with routine healing: Secondary | ICD-10-CM | POA: Diagnosis not present

## 2016-10-11 DIAGNOSIS — R2689 Other abnormalities of gait and mobility: Secondary | ICD-10-CM | POA: Diagnosis not present

## 2016-10-11 DIAGNOSIS — M6281 Muscle weakness (generalized): Secondary | ICD-10-CM | POA: Diagnosis not present

## 2016-10-11 DIAGNOSIS — N183 Chronic kidney disease, stage 3 (moderate): Secondary | ICD-10-CM | POA: Diagnosis not present

## 2016-10-11 DIAGNOSIS — I129 Hypertensive chronic kidney disease with stage 1 through stage 4 chronic kidney disease, or unspecified chronic kidney disease: Secondary | ICD-10-CM | POA: Diagnosis not present

## 2016-10-11 DIAGNOSIS — E119 Type 2 diabetes mellitus without complications: Secondary | ICD-10-CM | POA: Diagnosis not present

## 2016-10-15 DIAGNOSIS — E119 Type 2 diabetes mellitus without complications: Secondary | ICD-10-CM | POA: Diagnosis not present

## 2016-10-15 DIAGNOSIS — M6281 Muscle weakness (generalized): Secondary | ICD-10-CM | POA: Diagnosis not present

## 2016-10-15 DIAGNOSIS — I129 Hypertensive chronic kidney disease with stage 1 through stage 4 chronic kidney disease, or unspecified chronic kidney disease: Secondary | ICD-10-CM | POA: Diagnosis not present

## 2016-10-15 DIAGNOSIS — N183 Chronic kidney disease, stage 3 (moderate): Secondary | ICD-10-CM | POA: Diagnosis not present

## 2016-10-15 DIAGNOSIS — S32501D Unspecified fracture of right pubis, subsequent encounter for fracture with routine healing: Secondary | ICD-10-CM | POA: Diagnosis not present

## 2016-10-15 DIAGNOSIS — R2689 Other abnormalities of gait and mobility: Secondary | ICD-10-CM | POA: Diagnosis not present

## 2016-10-17 DIAGNOSIS — N183 Chronic kidney disease, stage 3 (moderate): Secondary | ICD-10-CM | POA: Diagnosis not present

## 2016-10-17 DIAGNOSIS — I129 Hypertensive chronic kidney disease with stage 1 through stage 4 chronic kidney disease, or unspecified chronic kidney disease: Secondary | ICD-10-CM | POA: Diagnosis not present

## 2016-10-17 DIAGNOSIS — E119 Type 2 diabetes mellitus without complications: Secondary | ICD-10-CM | POA: Diagnosis not present

## 2016-10-17 DIAGNOSIS — M6281 Muscle weakness (generalized): Secondary | ICD-10-CM | POA: Diagnosis not present

## 2016-10-17 DIAGNOSIS — R2689 Other abnormalities of gait and mobility: Secondary | ICD-10-CM | POA: Diagnosis not present

## 2016-10-17 DIAGNOSIS — S32501D Unspecified fracture of right pubis, subsequent encounter for fracture with routine healing: Secondary | ICD-10-CM | POA: Diagnosis not present

## 2016-10-18 DIAGNOSIS — M6281 Muscle weakness (generalized): Secondary | ICD-10-CM | POA: Diagnosis not present

## 2016-10-18 DIAGNOSIS — S32501D Unspecified fracture of right pubis, subsequent encounter for fracture with routine healing: Secondary | ICD-10-CM | POA: Diagnosis not present

## 2016-10-18 DIAGNOSIS — I129 Hypertensive chronic kidney disease with stage 1 through stage 4 chronic kidney disease, or unspecified chronic kidney disease: Secondary | ICD-10-CM | POA: Diagnosis not present

## 2016-10-18 DIAGNOSIS — R2689 Other abnormalities of gait and mobility: Secondary | ICD-10-CM | POA: Diagnosis not present

## 2016-10-18 DIAGNOSIS — E119 Type 2 diabetes mellitus without complications: Secondary | ICD-10-CM | POA: Diagnosis not present

## 2016-10-18 DIAGNOSIS — N183 Chronic kidney disease, stage 3 (moderate): Secondary | ICD-10-CM | POA: Diagnosis not present

## 2016-10-21 ENCOUNTER — Telehealth: Payer: Self-pay

## 2016-10-21 DIAGNOSIS — S32511D Fracture of superior rim of right pubis, subsequent encounter for fracture with routine healing: Secondary | ICD-10-CM | POA: Diagnosis not present

## 2016-10-21 DIAGNOSIS — M7062 Trochanteric bursitis, left hip: Secondary | ICD-10-CM | POA: Diagnosis not present

## 2016-10-21 NOTE — Telephone Encounter (Signed)
Done-aa 

## 2016-10-21 NOTE — Telephone Encounter (Signed)
ok 

## 2016-10-21 NOTE — Telephone Encounter (Signed)
[name], a PT with [facility], called requesting an order for medication management. Per [name], pt's BP was elevated at 180/78 and pt was unsure of which medications she took that day. This order needs to be faxed to [phone]. Renaldo Fiddler, CMA

## 2016-10-22 DIAGNOSIS — R2689 Other abnormalities of gait and mobility: Secondary | ICD-10-CM | POA: Diagnosis not present

## 2016-10-22 DIAGNOSIS — I129 Hypertensive chronic kidney disease with stage 1 through stage 4 chronic kidney disease, or unspecified chronic kidney disease: Secondary | ICD-10-CM | POA: Diagnosis not present

## 2016-10-22 DIAGNOSIS — N183 Chronic kidney disease, stage 3 (moderate): Secondary | ICD-10-CM | POA: Diagnosis not present

## 2016-10-22 DIAGNOSIS — E119 Type 2 diabetes mellitus without complications: Secondary | ICD-10-CM | POA: Diagnosis not present

## 2016-10-22 DIAGNOSIS — M6281 Muscle weakness (generalized): Secondary | ICD-10-CM | POA: Diagnosis not present

## 2016-10-22 DIAGNOSIS — S32501D Unspecified fracture of right pubis, subsequent encounter for fracture with routine healing: Secondary | ICD-10-CM | POA: Diagnosis not present

## 2016-10-24 DIAGNOSIS — E119 Type 2 diabetes mellitus without complications: Secondary | ICD-10-CM | POA: Diagnosis not present

## 2016-10-24 DIAGNOSIS — N183 Chronic kidney disease, stage 3 (moderate): Secondary | ICD-10-CM | POA: Diagnosis not present

## 2016-10-24 DIAGNOSIS — S32501D Unspecified fracture of right pubis, subsequent encounter for fracture with routine healing: Secondary | ICD-10-CM | POA: Diagnosis not present

## 2016-10-24 DIAGNOSIS — R2689 Other abnormalities of gait and mobility: Secondary | ICD-10-CM | POA: Diagnosis not present

## 2016-10-24 DIAGNOSIS — I129 Hypertensive chronic kidney disease with stage 1 through stage 4 chronic kidney disease, or unspecified chronic kidney disease: Secondary | ICD-10-CM | POA: Diagnosis not present

## 2016-10-24 DIAGNOSIS — M6281 Muscle weakness (generalized): Secondary | ICD-10-CM | POA: Diagnosis not present

## 2016-10-25 DIAGNOSIS — M6281 Muscle weakness (generalized): Secondary | ICD-10-CM | POA: Diagnosis not present

## 2016-10-25 DIAGNOSIS — R2689 Other abnormalities of gait and mobility: Secondary | ICD-10-CM | POA: Diagnosis not present

## 2016-10-25 DIAGNOSIS — E119 Type 2 diabetes mellitus without complications: Secondary | ICD-10-CM | POA: Diagnosis not present

## 2016-10-25 DIAGNOSIS — N183 Chronic kidney disease, stage 3 (moderate): Secondary | ICD-10-CM | POA: Diagnosis not present

## 2016-10-25 DIAGNOSIS — S32501D Unspecified fracture of right pubis, subsequent encounter for fracture with routine healing: Secondary | ICD-10-CM | POA: Diagnosis not present

## 2016-10-25 DIAGNOSIS — I129 Hypertensive chronic kidney disease with stage 1 through stage 4 chronic kidney disease, or unspecified chronic kidney disease: Secondary | ICD-10-CM | POA: Diagnosis not present

## 2016-10-26 ENCOUNTER — Other Ambulatory Visit: Payer: Self-pay | Admitting: Physician Assistant

## 2016-10-26 DIAGNOSIS — C50912 Malignant neoplasm of unspecified site of left female breast: Secondary | ICD-10-CM

## 2016-10-29 DIAGNOSIS — M6281 Muscle weakness (generalized): Secondary | ICD-10-CM | POA: Diagnosis not present

## 2016-10-29 DIAGNOSIS — E119 Type 2 diabetes mellitus without complications: Secondary | ICD-10-CM | POA: Diagnosis not present

## 2016-10-29 DIAGNOSIS — I129 Hypertensive chronic kidney disease with stage 1 through stage 4 chronic kidney disease, or unspecified chronic kidney disease: Secondary | ICD-10-CM | POA: Diagnosis not present

## 2016-10-29 DIAGNOSIS — R2689 Other abnormalities of gait and mobility: Secondary | ICD-10-CM | POA: Diagnosis not present

## 2016-10-29 DIAGNOSIS — N183 Chronic kidney disease, stage 3 (moderate): Secondary | ICD-10-CM | POA: Diagnosis not present

## 2016-10-29 DIAGNOSIS — S32501D Unspecified fracture of right pubis, subsequent encounter for fracture with routine healing: Secondary | ICD-10-CM | POA: Diagnosis not present

## 2016-10-29 NOTE — Telephone Encounter (Signed)
LOV 09/26/2016. Renaldo Fiddler, CMA

## 2016-10-30 DIAGNOSIS — E119 Type 2 diabetes mellitus without complications: Secondary | ICD-10-CM | POA: Diagnosis not present

## 2016-10-30 DIAGNOSIS — M6281 Muscle weakness (generalized): Secondary | ICD-10-CM | POA: Diagnosis not present

## 2016-10-30 DIAGNOSIS — N183 Chronic kidney disease, stage 3 (moderate): Secondary | ICD-10-CM | POA: Diagnosis not present

## 2016-10-30 DIAGNOSIS — S32501D Unspecified fracture of right pubis, subsequent encounter for fracture with routine healing: Secondary | ICD-10-CM | POA: Diagnosis not present

## 2016-10-30 DIAGNOSIS — I129 Hypertensive chronic kidney disease with stage 1 through stage 4 chronic kidney disease, or unspecified chronic kidney disease: Secondary | ICD-10-CM | POA: Diagnosis not present

## 2016-10-30 DIAGNOSIS — R2689 Other abnormalities of gait and mobility: Secondary | ICD-10-CM | POA: Diagnosis not present

## 2016-10-31 DIAGNOSIS — M6281 Muscle weakness (generalized): Secondary | ICD-10-CM | POA: Diagnosis not present

## 2016-10-31 DIAGNOSIS — I129 Hypertensive chronic kidney disease with stage 1 through stage 4 chronic kidney disease, or unspecified chronic kidney disease: Secondary | ICD-10-CM | POA: Diagnosis not present

## 2016-10-31 DIAGNOSIS — R2689 Other abnormalities of gait and mobility: Secondary | ICD-10-CM | POA: Diagnosis not present

## 2016-10-31 DIAGNOSIS — E119 Type 2 diabetes mellitus without complications: Secondary | ICD-10-CM | POA: Diagnosis not present

## 2016-10-31 DIAGNOSIS — N183 Chronic kidney disease, stage 3 (moderate): Secondary | ICD-10-CM | POA: Diagnosis not present

## 2016-10-31 DIAGNOSIS — S32501D Unspecified fracture of right pubis, subsequent encounter for fracture with routine healing: Secondary | ICD-10-CM | POA: Diagnosis not present

## 2016-11-01 DIAGNOSIS — M6281 Muscle weakness (generalized): Secondary | ICD-10-CM | POA: Diagnosis not present

## 2016-11-01 DIAGNOSIS — I129 Hypertensive chronic kidney disease with stage 1 through stage 4 chronic kidney disease, or unspecified chronic kidney disease: Secondary | ICD-10-CM | POA: Diagnosis not present

## 2016-11-01 DIAGNOSIS — E119 Type 2 diabetes mellitus without complications: Secondary | ICD-10-CM | POA: Diagnosis not present

## 2016-11-01 DIAGNOSIS — S32501D Unspecified fracture of right pubis, subsequent encounter for fracture with routine healing: Secondary | ICD-10-CM | POA: Diagnosis not present

## 2016-11-01 DIAGNOSIS — R2689 Other abnormalities of gait and mobility: Secondary | ICD-10-CM | POA: Diagnosis not present

## 2016-11-01 DIAGNOSIS — N183 Chronic kidney disease, stage 3 (moderate): Secondary | ICD-10-CM | POA: Diagnosis not present

## 2016-11-04 DIAGNOSIS — S51011A Laceration without foreign body of right elbow, initial encounter: Secondary | ICD-10-CM | POA: Diagnosis not present

## 2016-11-04 DIAGNOSIS — M7062 Trochanteric bursitis, left hip: Secondary | ICD-10-CM | POA: Diagnosis not present

## 2016-11-04 DIAGNOSIS — S32511A Fracture of superior rim of right pubis, initial encounter for closed fracture: Secondary | ICD-10-CM | POA: Diagnosis not present

## 2016-11-04 DIAGNOSIS — S32591A Other specified fracture of right pubis, initial encounter for closed fracture: Secondary | ICD-10-CM | POA: Diagnosis not present

## 2016-11-05 DIAGNOSIS — I129 Hypertensive chronic kidney disease with stage 1 through stage 4 chronic kidney disease, or unspecified chronic kidney disease: Secondary | ICD-10-CM | POA: Diagnosis not present

## 2016-11-05 DIAGNOSIS — R2689 Other abnormalities of gait and mobility: Secondary | ICD-10-CM | POA: Diagnosis not present

## 2016-11-05 DIAGNOSIS — M6281 Muscle weakness (generalized): Secondary | ICD-10-CM | POA: Diagnosis not present

## 2016-11-05 DIAGNOSIS — N183 Chronic kidney disease, stage 3 (moderate): Secondary | ICD-10-CM | POA: Diagnosis not present

## 2016-11-05 DIAGNOSIS — S32501D Unspecified fracture of right pubis, subsequent encounter for fracture with routine healing: Secondary | ICD-10-CM | POA: Diagnosis not present

## 2016-11-05 DIAGNOSIS — E119 Type 2 diabetes mellitus without complications: Secondary | ICD-10-CM | POA: Diagnosis not present

## 2016-11-06 DIAGNOSIS — E119 Type 2 diabetes mellitus without complications: Secondary | ICD-10-CM | POA: Diagnosis not present

## 2016-11-06 DIAGNOSIS — S32501D Unspecified fracture of right pubis, subsequent encounter for fracture with routine healing: Secondary | ICD-10-CM | POA: Diagnosis not present

## 2016-11-06 DIAGNOSIS — I129 Hypertensive chronic kidney disease with stage 1 through stage 4 chronic kidney disease, or unspecified chronic kidney disease: Secondary | ICD-10-CM | POA: Diagnosis not present

## 2016-11-06 DIAGNOSIS — M6281 Muscle weakness (generalized): Secondary | ICD-10-CM | POA: Diagnosis not present

## 2016-11-06 DIAGNOSIS — R2689 Other abnormalities of gait and mobility: Secondary | ICD-10-CM | POA: Diagnosis not present

## 2016-11-06 DIAGNOSIS — N183 Chronic kidney disease, stage 3 (moderate): Secondary | ICD-10-CM | POA: Diagnosis not present

## 2016-11-07 DIAGNOSIS — I129 Hypertensive chronic kidney disease with stage 1 through stage 4 chronic kidney disease, or unspecified chronic kidney disease: Secondary | ICD-10-CM | POA: Diagnosis not present

## 2016-11-07 DIAGNOSIS — E119 Type 2 diabetes mellitus without complications: Secondary | ICD-10-CM | POA: Diagnosis not present

## 2016-11-07 DIAGNOSIS — M6281 Muscle weakness (generalized): Secondary | ICD-10-CM | POA: Diagnosis not present

## 2016-11-07 DIAGNOSIS — R2689 Other abnormalities of gait and mobility: Secondary | ICD-10-CM | POA: Diagnosis not present

## 2016-11-07 DIAGNOSIS — S32501D Unspecified fracture of right pubis, subsequent encounter for fracture with routine healing: Secondary | ICD-10-CM | POA: Diagnosis not present

## 2016-11-07 DIAGNOSIS — N183 Chronic kidney disease, stage 3 (moderate): Secondary | ICD-10-CM | POA: Diagnosis not present

## 2016-11-08 DIAGNOSIS — S32501D Unspecified fracture of right pubis, subsequent encounter for fracture with routine healing: Secondary | ICD-10-CM | POA: Diagnosis not present

## 2016-11-08 DIAGNOSIS — I129 Hypertensive chronic kidney disease with stage 1 through stage 4 chronic kidney disease, or unspecified chronic kidney disease: Secondary | ICD-10-CM | POA: Diagnosis not present

## 2016-11-08 DIAGNOSIS — R2689 Other abnormalities of gait and mobility: Secondary | ICD-10-CM | POA: Diagnosis not present

## 2016-11-08 DIAGNOSIS — M6281 Muscle weakness (generalized): Secondary | ICD-10-CM | POA: Diagnosis not present

## 2016-11-08 DIAGNOSIS — N183 Chronic kidney disease, stage 3 (moderate): Secondary | ICD-10-CM | POA: Diagnosis not present

## 2016-11-08 DIAGNOSIS — E119 Type 2 diabetes mellitus without complications: Secondary | ICD-10-CM | POA: Diagnosis not present

## 2016-11-09 DIAGNOSIS — E119 Type 2 diabetes mellitus without complications: Secondary | ICD-10-CM | POA: Diagnosis not present

## 2016-11-09 DIAGNOSIS — N183 Chronic kidney disease, stage 3 (moderate): Secondary | ICD-10-CM | POA: Diagnosis not present

## 2016-11-09 DIAGNOSIS — R2689 Other abnormalities of gait and mobility: Secondary | ICD-10-CM | POA: Diagnosis not present

## 2016-11-09 DIAGNOSIS — I129 Hypertensive chronic kidney disease with stage 1 through stage 4 chronic kidney disease, or unspecified chronic kidney disease: Secondary | ICD-10-CM | POA: Diagnosis not present

## 2016-11-09 DIAGNOSIS — S32501D Unspecified fracture of right pubis, subsequent encounter for fracture with routine healing: Secondary | ICD-10-CM | POA: Diagnosis not present

## 2016-11-09 DIAGNOSIS — M6281 Muscle weakness (generalized): Secondary | ICD-10-CM | POA: Diagnosis not present

## 2016-11-12 DIAGNOSIS — N183 Chronic kidney disease, stage 3 (moderate): Secondary | ICD-10-CM | POA: Diagnosis not present

## 2016-11-12 DIAGNOSIS — M6281 Muscle weakness (generalized): Secondary | ICD-10-CM | POA: Diagnosis not present

## 2016-11-12 DIAGNOSIS — I129 Hypertensive chronic kidney disease with stage 1 through stage 4 chronic kidney disease, or unspecified chronic kidney disease: Secondary | ICD-10-CM | POA: Diagnosis not present

## 2016-11-12 DIAGNOSIS — S32501D Unspecified fracture of right pubis, subsequent encounter for fracture with routine healing: Secondary | ICD-10-CM | POA: Diagnosis not present

## 2016-11-12 DIAGNOSIS — E119 Type 2 diabetes mellitus without complications: Secondary | ICD-10-CM | POA: Diagnosis not present

## 2016-11-12 DIAGNOSIS — R2689 Other abnormalities of gait and mobility: Secondary | ICD-10-CM | POA: Diagnosis not present

## 2016-11-13 DIAGNOSIS — M6281 Muscle weakness (generalized): Secondary | ICD-10-CM | POA: Diagnosis not present

## 2016-11-13 DIAGNOSIS — E119 Type 2 diabetes mellitus without complications: Secondary | ICD-10-CM | POA: Diagnosis not present

## 2016-11-13 DIAGNOSIS — S32501D Unspecified fracture of right pubis, subsequent encounter for fracture with routine healing: Secondary | ICD-10-CM | POA: Diagnosis not present

## 2016-11-13 DIAGNOSIS — R2689 Other abnormalities of gait and mobility: Secondary | ICD-10-CM | POA: Diagnosis not present

## 2016-11-13 DIAGNOSIS — I129 Hypertensive chronic kidney disease with stage 1 through stage 4 chronic kidney disease, or unspecified chronic kidney disease: Secondary | ICD-10-CM | POA: Diagnosis not present

## 2016-11-13 DIAGNOSIS — N183 Chronic kidney disease, stage 3 (moderate): Secondary | ICD-10-CM | POA: Diagnosis not present

## 2016-11-14 DIAGNOSIS — S32501D Unspecified fracture of right pubis, subsequent encounter for fracture with routine healing: Secondary | ICD-10-CM | POA: Diagnosis not present

## 2016-11-14 DIAGNOSIS — M6281 Muscle weakness (generalized): Secondary | ICD-10-CM | POA: Diagnosis not present

## 2016-11-14 DIAGNOSIS — E119 Type 2 diabetes mellitus without complications: Secondary | ICD-10-CM | POA: Diagnosis not present

## 2016-11-14 DIAGNOSIS — R2689 Other abnormalities of gait and mobility: Secondary | ICD-10-CM | POA: Diagnosis not present

## 2016-11-14 DIAGNOSIS — N183 Chronic kidney disease, stage 3 (moderate): Secondary | ICD-10-CM | POA: Diagnosis not present

## 2016-11-14 DIAGNOSIS — I129 Hypertensive chronic kidney disease with stage 1 through stage 4 chronic kidney disease, or unspecified chronic kidney disease: Secondary | ICD-10-CM | POA: Diagnosis not present

## 2016-11-19 DIAGNOSIS — R2689 Other abnormalities of gait and mobility: Secondary | ICD-10-CM | POA: Diagnosis not present

## 2016-11-19 DIAGNOSIS — N183 Chronic kidney disease, stage 3 (moderate): Secondary | ICD-10-CM | POA: Diagnosis not present

## 2016-11-19 DIAGNOSIS — M6281 Muscle weakness (generalized): Secondary | ICD-10-CM | POA: Diagnosis not present

## 2016-11-19 DIAGNOSIS — I129 Hypertensive chronic kidney disease with stage 1 through stage 4 chronic kidney disease, or unspecified chronic kidney disease: Secondary | ICD-10-CM | POA: Diagnosis not present

## 2016-11-19 DIAGNOSIS — E119 Type 2 diabetes mellitus without complications: Secondary | ICD-10-CM | POA: Diagnosis not present

## 2016-11-19 DIAGNOSIS — S32501D Unspecified fracture of right pubis, subsequent encounter for fracture with routine healing: Secondary | ICD-10-CM | POA: Diagnosis not present

## 2016-11-20 DIAGNOSIS — R2689 Other abnormalities of gait and mobility: Secondary | ICD-10-CM | POA: Diagnosis not present

## 2016-11-20 DIAGNOSIS — M6281 Muscle weakness (generalized): Secondary | ICD-10-CM | POA: Diagnosis not present

## 2016-11-20 DIAGNOSIS — N183 Chronic kidney disease, stage 3 (moderate): Secondary | ICD-10-CM | POA: Diagnosis not present

## 2016-11-20 DIAGNOSIS — S32501D Unspecified fracture of right pubis, subsequent encounter for fracture with routine healing: Secondary | ICD-10-CM | POA: Diagnosis not present

## 2016-11-20 DIAGNOSIS — I129 Hypertensive chronic kidney disease with stage 1 through stage 4 chronic kidney disease, or unspecified chronic kidney disease: Secondary | ICD-10-CM | POA: Diagnosis not present

## 2016-11-20 DIAGNOSIS — E119 Type 2 diabetes mellitus without complications: Secondary | ICD-10-CM | POA: Diagnosis not present

## 2016-11-21 DIAGNOSIS — E119 Type 2 diabetes mellitus without complications: Secondary | ICD-10-CM | POA: Diagnosis not present

## 2016-11-21 DIAGNOSIS — N183 Chronic kidney disease, stage 3 (moderate): Secondary | ICD-10-CM | POA: Diagnosis not present

## 2016-11-21 DIAGNOSIS — R2689 Other abnormalities of gait and mobility: Secondary | ICD-10-CM | POA: Diagnosis not present

## 2016-11-21 DIAGNOSIS — M6281 Muscle weakness (generalized): Secondary | ICD-10-CM | POA: Diagnosis not present

## 2016-11-21 DIAGNOSIS — S32501D Unspecified fracture of right pubis, subsequent encounter for fracture with routine healing: Secondary | ICD-10-CM | POA: Diagnosis not present

## 2016-11-21 DIAGNOSIS — I129 Hypertensive chronic kidney disease with stage 1 through stage 4 chronic kidney disease, or unspecified chronic kidney disease: Secondary | ICD-10-CM | POA: Diagnosis not present

## 2016-11-25 ENCOUNTER — Other Ambulatory Visit: Payer: Self-pay | Admitting: Physician Assistant

## 2016-11-25 DIAGNOSIS — E119 Type 2 diabetes mellitus without complications: Secondary | ICD-10-CM | POA: Diagnosis not present

## 2016-11-25 DIAGNOSIS — E1142 Type 2 diabetes mellitus with diabetic polyneuropathy: Secondary | ICD-10-CM

## 2016-11-25 DIAGNOSIS — N183 Chronic kidney disease, stage 3 (moderate): Secondary | ICD-10-CM | POA: Diagnosis not present

## 2016-11-25 DIAGNOSIS — I129 Hypertensive chronic kidney disease with stage 1 through stage 4 chronic kidney disease, or unspecified chronic kidney disease: Secondary | ICD-10-CM | POA: Diagnosis not present

## 2016-11-25 DIAGNOSIS — R2689 Other abnormalities of gait and mobility: Secondary | ICD-10-CM | POA: Diagnosis not present

## 2016-11-25 DIAGNOSIS — S32501D Unspecified fracture of right pubis, subsequent encounter for fracture with routine healing: Secondary | ICD-10-CM | POA: Diagnosis not present

## 2016-11-25 DIAGNOSIS — M6281 Muscle weakness (generalized): Secondary | ICD-10-CM | POA: Diagnosis not present

## 2016-11-25 DIAGNOSIS — E7800 Pure hypercholesterolemia, unspecified: Secondary | ICD-10-CM

## 2016-11-25 MED ORDER — METFORMIN HCL 500 MG PO TABS: 500.0000 mg | ORAL_TABLET | Freq: Two times a day (BID) | ORAL | 3 refills | Status: DC

## 2016-11-25 MED ORDER — ATORVASTATIN CALCIUM 80 MG PO TABS: 80.0000 mg | ORAL_TABLET | Freq: Every day | ORAL | 3 refills | Status: DC

## 2016-11-25 NOTE — Telephone Encounter (Signed)
[facility] faxed office for refill request on the following medications: 1. metFORMIN (GLUCOPHAGE) 500 MG tablet   2. atorvastatin (LIPITOR) 80 MG tablet 90 day supply [facility] mail order pharmacy Please advise. Thanks TNP

## 2016-11-25 NOTE — Telephone Encounter (Signed)
Last ov 09/26/16 Last filled 11/29/15 Please review. Thank you. sd

## 2016-11-26 DIAGNOSIS — N183 Chronic kidney disease, stage 3 (moderate): Secondary | ICD-10-CM | POA: Diagnosis not present

## 2016-11-26 DIAGNOSIS — I129 Hypertensive chronic kidney disease with stage 1 through stage 4 chronic kidney disease, or unspecified chronic kidney disease: Secondary | ICD-10-CM | POA: Diagnosis not present

## 2016-11-26 DIAGNOSIS — E119 Type 2 diabetes mellitus without complications: Secondary | ICD-10-CM | POA: Diagnosis not present

## 2016-11-26 DIAGNOSIS — S32501D Unspecified fracture of right pubis, subsequent encounter for fracture with routine healing: Secondary | ICD-10-CM | POA: Diagnosis not present

## 2016-11-26 DIAGNOSIS — M6281 Muscle weakness (generalized): Secondary | ICD-10-CM | POA: Diagnosis not present

## 2016-11-26 DIAGNOSIS — R2689 Other abnormalities of gait and mobility: Secondary | ICD-10-CM | POA: Diagnosis not present

## 2016-11-28 DIAGNOSIS — R2689 Other abnormalities of gait and mobility: Secondary | ICD-10-CM | POA: Diagnosis not present

## 2016-11-28 DIAGNOSIS — M6281 Muscle weakness (generalized): Secondary | ICD-10-CM | POA: Diagnosis not present

## 2016-11-28 DIAGNOSIS — S32501D Unspecified fracture of right pubis, subsequent encounter for fracture with routine healing: Secondary | ICD-10-CM | POA: Diagnosis not present

## 2016-11-28 DIAGNOSIS — N183 Chronic kidney disease, stage 3 (moderate): Secondary | ICD-10-CM | POA: Diagnosis not present

## 2016-11-28 DIAGNOSIS — I129 Hypertensive chronic kidney disease with stage 1 through stage 4 chronic kidney disease, or unspecified chronic kidney disease: Secondary | ICD-10-CM | POA: Diagnosis not present

## 2016-11-28 DIAGNOSIS — E119 Type 2 diabetes mellitus without complications: Secondary | ICD-10-CM | POA: Diagnosis not present

## 2016-11-29 DIAGNOSIS — R2689 Other abnormalities of gait and mobility: Secondary | ICD-10-CM | POA: Diagnosis not present

## 2016-11-29 DIAGNOSIS — K219 Gastro-esophageal reflux disease without esophagitis: Secondary | ICD-10-CM | POA: Diagnosis not present

## 2016-11-29 DIAGNOSIS — E7800 Pure hypercholesterolemia, unspecified: Secondary | ICD-10-CM | POA: Diagnosis not present

## 2016-11-29 DIAGNOSIS — I890 Lymphedema, not elsewhere classified: Secondary | ICD-10-CM | POA: Diagnosis not present

## 2016-11-29 DIAGNOSIS — S32511D Fracture of superior rim of right pubis, subsequent encounter for fracture with routine healing: Secondary | ICD-10-CM | POA: Diagnosis not present

## 2016-11-29 DIAGNOSIS — E119 Type 2 diabetes mellitus without complications: Secondary | ICD-10-CM | POA: Diagnosis not present

## 2016-11-29 DIAGNOSIS — D509 Iron deficiency anemia, unspecified: Secondary | ICD-10-CM | POA: Diagnosis not present

## 2016-11-29 DIAGNOSIS — K5790 Diverticulosis of intestine, part unspecified, without perforation or abscess without bleeding: Secondary | ICD-10-CM | POA: Diagnosis not present

## 2016-11-29 DIAGNOSIS — Z9181 History of falling: Secondary | ICD-10-CM | POA: Diagnosis not present

## 2016-11-29 DIAGNOSIS — Z7984 Long term (current) use of oral hypoglycemic drugs: Secondary | ICD-10-CM | POA: Diagnosis not present

## 2016-11-29 DIAGNOSIS — Z853 Personal history of malignant neoplasm of breast: Secondary | ICD-10-CM | POA: Diagnosis not present

## 2016-11-29 DIAGNOSIS — I509 Heart failure, unspecified: Secondary | ICD-10-CM | POA: Diagnosis not present

## 2016-11-29 DIAGNOSIS — N183 Chronic kidney disease, stage 3 (moderate): Secondary | ICD-10-CM | POA: Diagnosis not present

## 2016-11-29 DIAGNOSIS — I130 Hypertensive heart and chronic kidney disease with heart failure and stage 1 through stage 4 chronic kidney disease, or unspecified chronic kidney disease: Secondary | ICD-10-CM | POA: Diagnosis not present

## 2016-11-29 DIAGNOSIS — M6281 Muscle weakness (generalized): Secondary | ICD-10-CM | POA: Diagnosis not present

## 2016-12-03 DIAGNOSIS — R2689 Other abnormalities of gait and mobility: Secondary | ICD-10-CM | POA: Diagnosis not present

## 2016-12-03 DIAGNOSIS — I130 Hypertensive heart and chronic kidney disease with heart failure and stage 1 through stage 4 chronic kidney disease, or unspecified chronic kidney disease: Secondary | ICD-10-CM | POA: Diagnosis not present

## 2016-12-03 DIAGNOSIS — I509 Heart failure, unspecified: Secondary | ICD-10-CM | POA: Diagnosis not present

## 2016-12-03 DIAGNOSIS — M6281 Muscle weakness (generalized): Secondary | ICD-10-CM | POA: Diagnosis not present

## 2016-12-03 DIAGNOSIS — S32511D Fracture of superior rim of right pubis, subsequent encounter for fracture with routine healing: Secondary | ICD-10-CM | POA: Diagnosis not present

## 2016-12-03 DIAGNOSIS — E119 Type 2 diabetes mellitus without complications: Secondary | ICD-10-CM | POA: Diagnosis not present

## 2016-12-05 DIAGNOSIS — R2689 Other abnormalities of gait and mobility: Secondary | ICD-10-CM | POA: Diagnosis not present

## 2016-12-05 DIAGNOSIS — M6281 Muscle weakness (generalized): Secondary | ICD-10-CM | POA: Diagnosis not present

## 2016-12-05 DIAGNOSIS — E119 Type 2 diabetes mellitus without complications: Secondary | ICD-10-CM | POA: Diagnosis not present

## 2016-12-05 DIAGNOSIS — S32511D Fracture of superior rim of right pubis, subsequent encounter for fracture with routine healing: Secondary | ICD-10-CM | POA: Diagnosis not present

## 2016-12-05 DIAGNOSIS — I509 Heart failure, unspecified: Secondary | ICD-10-CM | POA: Diagnosis not present

## 2016-12-05 DIAGNOSIS — I130 Hypertensive heart and chronic kidney disease with heart failure and stage 1 through stage 4 chronic kidney disease, or unspecified chronic kidney disease: Secondary | ICD-10-CM | POA: Diagnosis not present

## 2016-12-10 DIAGNOSIS — I130 Hypertensive heart and chronic kidney disease with heart failure and stage 1 through stage 4 chronic kidney disease, or unspecified chronic kidney disease: Secondary | ICD-10-CM | POA: Diagnosis not present

## 2016-12-10 DIAGNOSIS — E119 Type 2 diabetes mellitus without complications: Secondary | ICD-10-CM | POA: Diagnosis not present

## 2016-12-10 DIAGNOSIS — I509 Heart failure, unspecified: Secondary | ICD-10-CM | POA: Diagnosis not present

## 2016-12-10 DIAGNOSIS — R2689 Other abnormalities of gait and mobility: Secondary | ICD-10-CM | POA: Diagnosis not present

## 2016-12-10 DIAGNOSIS — S32511D Fracture of superior rim of right pubis, subsequent encounter for fracture with routine healing: Secondary | ICD-10-CM | POA: Diagnosis not present

## 2016-12-10 DIAGNOSIS — M6281 Muscle weakness (generalized): Secondary | ICD-10-CM | POA: Diagnosis not present

## 2016-12-12 DIAGNOSIS — S32511D Fracture of superior rim of right pubis, subsequent encounter for fracture with routine healing: Secondary | ICD-10-CM | POA: Diagnosis not present

## 2016-12-12 DIAGNOSIS — M6281 Muscle weakness (generalized): Secondary | ICD-10-CM | POA: Diagnosis not present

## 2016-12-12 DIAGNOSIS — I509 Heart failure, unspecified: Secondary | ICD-10-CM | POA: Diagnosis not present

## 2016-12-12 DIAGNOSIS — R2689 Other abnormalities of gait and mobility: Secondary | ICD-10-CM | POA: Diagnosis not present

## 2016-12-12 DIAGNOSIS — I130 Hypertensive heart and chronic kidney disease with heart failure and stage 1 through stage 4 chronic kidney disease, or unspecified chronic kidney disease: Secondary | ICD-10-CM | POA: Diagnosis not present

## 2016-12-12 DIAGNOSIS — E119 Type 2 diabetes mellitus without complications: Secondary | ICD-10-CM | POA: Diagnosis not present

## 2016-12-16 DIAGNOSIS — S32511D Fracture of superior rim of right pubis, subsequent encounter for fracture with routine healing: Secondary | ICD-10-CM | POA: Diagnosis not present

## 2016-12-16 DIAGNOSIS — M7062 Trochanteric bursitis, left hip: Secondary | ICD-10-CM | POA: Diagnosis not present

## 2016-12-16 DIAGNOSIS — S32591D Other specified fracture of right pubis, subsequent encounter for fracture with routine healing: Secondary | ICD-10-CM | POA: Diagnosis not present

## 2016-12-17 DIAGNOSIS — S32511D Fracture of superior rim of right pubis, subsequent encounter for fracture with routine healing: Secondary | ICD-10-CM | POA: Diagnosis not present

## 2016-12-17 DIAGNOSIS — I130 Hypertensive heart and chronic kidney disease with heart failure and stage 1 through stage 4 chronic kidney disease, or unspecified chronic kidney disease: Secondary | ICD-10-CM | POA: Diagnosis not present

## 2016-12-17 DIAGNOSIS — E119 Type 2 diabetes mellitus without complications: Secondary | ICD-10-CM | POA: Diagnosis not present

## 2016-12-17 DIAGNOSIS — M6281 Muscle weakness (generalized): Secondary | ICD-10-CM | POA: Diagnosis not present

## 2016-12-17 DIAGNOSIS — R2689 Other abnormalities of gait and mobility: Secondary | ICD-10-CM | POA: Diagnosis not present

## 2016-12-17 DIAGNOSIS — I509 Heart failure, unspecified: Secondary | ICD-10-CM | POA: Diagnosis not present

## 2016-12-23 ENCOUNTER — Other Ambulatory Visit: Payer: Self-pay

## 2016-12-23 DIAGNOSIS — Z1231 Encounter for screening mammogram for malignant neoplasm of breast: Secondary | ICD-10-CM

## 2016-12-23 DIAGNOSIS — C50012 Malignant neoplasm of nipple and areola, left female breast: Secondary | ICD-10-CM

## 2016-12-24 DIAGNOSIS — I130 Hypertensive heart and chronic kidney disease with heart failure and stage 1 through stage 4 chronic kidney disease, or unspecified chronic kidney disease: Secondary | ICD-10-CM | POA: Diagnosis not present

## 2016-12-24 DIAGNOSIS — E119 Type 2 diabetes mellitus without complications: Secondary | ICD-10-CM | POA: Diagnosis not present

## 2016-12-24 DIAGNOSIS — S32511D Fracture of superior rim of right pubis, subsequent encounter for fracture with routine healing: Secondary | ICD-10-CM | POA: Diagnosis not present

## 2016-12-24 DIAGNOSIS — I509 Heart failure, unspecified: Secondary | ICD-10-CM | POA: Diagnosis not present

## 2016-12-24 DIAGNOSIS — R2689 Other abnormalities of gait and mobility: Secondary | ICD-10-CM | POA: Diagnosis not present

## 2016-12-24 DIAGNOSIS — M6281 Muscle weakness (generalized): Secondary | ICD-10-CM | POA: Diagnosis not present

## 2016-12-26 DIAGNOSIS — R2689 Other abnormalities of gait and mobility: Secondary | ICD-10-CM | POA: Diagnosis not present

## 2016-12-26 DIAGNOSIS — M6281 Muscle weakness (generalized): Secondary | ICD-10-CM | POA: Diagnosis not present

## 2016-12-26 DIAGNOSIS — I509 Heart failure, unspecified: Secondary | ICD-10-CM | POA: Diagnosis not present

## 2016-12-26 DIAGNOSIS — E119 Type 2 diabetes mellitus without complications: Secondary | ICD-10-CM | POA: Diagnosis not present

## 2016-12-26 DIAGNOSIS — S32511D Fracture of superior rim of right pubis, subsequent encounter for fracture with routine healing: Secondary | ICD-10-CM | POA: Diagnosis not present

## 2016-12-26 DIAGNOSIS — I130 Hypertensive heart and chronic kidney disease with heart failure and stage 1 through stage 4 chronic kidney disease, or unspecified chronic kidney disease: Secondary | ICD-10-CM | POA: Diagnosis not present

## 2017-01-18 ENCOUNTER — Other Ambulatory Visit: Payer: Self-pay | Admitting: Physician Assistant

## 2017-01-18 DIAGNOSIS — G4700 Insomnia, unspecified: Secondary | ICD-10-CM

## 2017-02-15 ENCOUNTER — Other Ambulatory Visit: Payer: Self-pay | Admitting: Physician Assistant

## 2017-02-15 DIAGNOSIS — R600 Localized edema: Secondary | ICD-10-CM

## 2017-02-17 ENCOUNTER — Ambulatory Visit: Payer: Medicare Other | Admitting: General Surgery

## 2017-02-18 NOTE — Telephone Encounter (Signed)
Please review for Gardenia Phlegm, RMA

## 2017-03-10 ENCOUNTER — Telehealth: Payer: Self-pay

## 2017-03-10 ENCOUNTER — Ambulatory Visit
Admission: RE | Admit: 2017-03-10 | Discharge: 2017-03-10 | Disposition: A | Discharge status: Home / Self Care | Payer: Medicare Other | Source: Ambulatory Visit | Attending: General Surgery | Admitting: General Surgery

## 2017-03-10 DIAGNOSIS — Z1231 Encounter for screening mammogram for malignant neoplasm of breast: Secondary | ICD-10-CM | POA: Insufficient documentation

## 2017-03-10 NOTE — Telephone Encounter (Signed)
-----   Message from Mar Daring, [place] sent at 03/10/2017  8:45 AM EDT ----- Normal mammogram. Repeat screening in one year.

## 2017-03-10 NOTE — Telephone Encounter (Signed)
Pt advised.   Thanks,   -Decari Duggar  

## 2017-03-11 DIAGNOSIS — I10 Essential (primary) hypertension: Secondary | ICD-10-CM | POA: Diagnosis not present

## 2017-03-11 DIAGNOSIS — G4733 Obstructive sleep apnea (adult) (pediatric): Secondary | ICD-10-CM | POA: Diagnosis not present

## 2017-03-11 DIAGNOSIS — E7801 Familial hypercholesterolemia: Secondary | ICD-10-CM | POA: Diagnosis not present

## 2017-03-19 ENCOUNTER — Encounter: Payer: Self-pay | Admitting: General Surgery

## 2017-03-19 ENCOUNTER — Ambulatory Visit (INDEPENDENT_AMBULATORY_CARE_PROVIDER_SITE_OTHER): Payer: Medicare Other | Admitting: General Surgery

## 2017-03-19 VITALS — BP 186/80 | HR 80 | Resp 16 | Ht 67.0 in | Wt 209.2 lb

## 2017-03-19 DIAGNOSIS — C50012 Malignant neoplasm of nipple and areola, left female breast: Secondary | ICD-10-CM | POA: Diagnosis not present

## 2017-03-19 DIAGNOSIS — Z170 Estrogen receptor positive status [ER+]: Secondary | ICD-10-CM

## 2017-03-19 DIAGNOSIS — M810 Age-related osteoporosis without current pathological fracture: Secondary | ICD-10-CM | POA: Diagnosis not present

## 2017-03-19 NOTE — Progress Notes (Signed)
Patient ID: Robin Ashley, female   DOB: Dec 23, 1931, 83 y.o.   MRN: [id number]  Chief Complaint  Patient presents with  . Follow-up    Right Breast Mammogram    HPI Robin Ashley is a 83 y.o. female.  who presents for a breast evaluation. The most recent right breast mammogram was done on 03/10/17.  Patient does perform regular self breast checks and gets regular mammograms done.    HPI  Past Medical History:  Diagnosis Date  . 174.4 03/2013   Left breast, T1c, N0, 12 mm; ER PR positive, HER-2/neu not over expressed. Not a candidate for adjuvant chemotherapy per [facility] tumor board.  . Diabetes mellitus without complication ([place])   . Foot drop, right October 2014   noted post mastectomy, conservative treatment was instituted with resolution, mild edema.  . Hyperlipidemia   . Hypertension     Past Surgical History:  Procedure Laterality Date  . BREAST BIOPSY Left 02/25/13   positive  . BREAST SURGERY Left 03-15-13   left mastectomywith SN biopsy  . COLON SURGERY  1991   Likely segmental resection for diverticulitis based on patient description.  . COLONOSCOPY    . EYE SURGERY Bilateral 04/2012  . intestinal balloon removed  1991   Likely surgery for diverticulitis.  Marland Kitchen MASTECTOMY Left 2014   positive  . 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    Family History  Problem Relation Age of Onset  . Cancer Brother        colon  . Diabetes Brother   . Heart disease Brother   . Diabetes Brother   . Breast cancer Neg Hx     Social History Social History  Substance Use Topics  . Smoking status: Never Smoker  . Smokeless tobacco: Never Used  . Alcohol use No    No Known Allergies  Current Outpatient Prescriptions  Medication Sig Dispense Refill  . amLODipine (NORVASC) 5 MG tablet     . aspirin 81 MG tablet Take 1 tablet (81 mg total) by mouth daily. 30 tablet 0  . atorvastatin (LIPITOR) 80 MG tablet Take 1  tablet (80 mg total) by mouth daily. 90 tablet 3  . ferrous sulfate ([facility] IRON) 325 (65 FE) MG tablet Take 1 tablet (325 mg total) by mouth 2 (two) times daily. 180 tablet 1  . furosemide (LASIX) 20 MG tablet TAKE 1 TABLET DAILY 90 tablet 1  . HYDROcodone-acetaminophen (NORCO/VICODIN) 5-325 MG tablet Take 1-2 tablets by mouth every 4 (four) hours as needed for moderate pain. 30 tablet 0  . letrozole (FEMARA) 2.5 MG tablet TAKE 1 TABLET DAILY 90 tablet 1  . lisinopril (PRINIVIL,ZESTRIL) 20 MG tablet     . meloxicam (MOBIC) 15 MG tablet TAKE 1 TABLET DAILY 90 tablet 1  . metFORMIN (GLUCOPHAGE) 500 MG tablet Take 1 tablet (500 mg total) by mouth 2 (two) times daily. 180 tablet 3  . Multiple Vitamins-Minerals (CENTRUM SILVER PO) Take 1 tablet by mouth daily.    Marland Kitchen omeprazole (PRILOSEC) 20 MG capsule TAKE 1 CAPSULE DAILY 90 capsule 4  . polyethylene glycol powder (GLYCOLAX/MIRALAX) powder   0  . traZODone (DESYREL) 50 MG tablet TAKE 1 TABLET EVERY EVENING 90 tablet 3   No current facility-administered medications for this visit.     Review of Systems Review of Systems  Constitutional: Negative.   Respiratory: Negative.   Cardiovascular: Negative.     Blood pressure (!) 186/80,  pulse 80, resp. rate 16, height _0  (1.702 m), weight 209 lb 3.2 oz (94.9 kg).  Physical Exam Physical Exam  Constitutional: She is oriented to person, place, and time. She appears well-developed and well-nourished.  Eyes: Conjunctivae are normal. No scleral icterus.  Neck: Neck supple.  Cardiovascular: Normal rate, regular rhythm and normal heart sounds.   Pulmonary/Chest: Effort normal and breath sounds normal. Right breast exhibits no inverted nipple, no mass, no nipple discharge, no skin change and no tenderness.    Musculoskeletal:       Arms: Lymphadenopathy:    She has no cervical adenopathy.    She has no axillary adenopathy.  Neurological: She is alert and oriented to person, place, and time.  Skin:  Skin is warm and dry.    Data Reviewed 03/10/2017 right breast screening mammogram was reviewed. BIRAD-1. 04/14/2014 bone density showed a T score of -3.1 consistent with osteoporosis.  Assessment    No evidence of recurrent breast cancer.  Good tolerance of letrozole.  Recent hospitalization for pubic fracture after a fall.  Modest change in renal function with a creatinine of 1.31 with an estimated GFR 37. Normal electrolytes.    Plan    The patient is tolerating her letrozole well.  We'll confirm that she is taking calcium supplements at follow-up. She may be a candidate for more aggressive measures based on her low T score in 2015.  Patient to be scheduled for a bone density test. Patient to return in 1 year, right diagnostic mammogram. The patient is aware to call back for any questions or concerns.     HPI, Physical Exam, Assessment and Plan have been scribed under the direction and in the presence of Hervey Ard, MD.  Gaspar Cola, CMA  I have completed the exam and reviewed the above documentation for accuracy and completeness.  I agree with the above.  Haematologist has been used and any errors in dictation or transcription are unintentional.  Hervey Ard, M.D., F.A.C.S.   Robert Bellow 03/19/2017, 9:29 AM  Patient has been scheduled for a bone density test at the [facility] for 04-16-17 at 9 am. She is aware of date and time.   Dominga Ferry, CMA

## 2017-03-19 NOTE — Patient Instructions (Signed)
Patient to be scheduled for a bone density test. Patient to return in 1 year, right diagnostic mammogram. The patient is aware to call back for any questions or concerns.

## 2017-03-28 ENCOUNTER — Ambulatory Visit (INDEPENDENT_AMBULATORY_CARE_PROVIDER_SITE_OTHER): Payer: Medicare Other | Admitting: Physician Assistant

## 2017-03-28 ENCOUNTER — Encounter: Payer: Self-pay | Admitting: Physician Assistant

## 2017-03-28 VITALS — BP 140/80 | HR 87 | Temp 98.1°F | Resp 20 | Ht 67.0 in | Wt 209.0 lb

## 2017-03-28 DIAGNOSIS — E7800 Pure hypercholesterolemia, unspecified: Secondary | ICD-10-CM

## 2017-03-28 DIAGNOSIS — E119 Type 2 diabetes mellitus without complications: Secondary | ICD-10-CM

## 2017-03-28 DIAGNOSIS — Z23 Encounter for immunization: Secondary | ICD-10-CM

## 2017-03-28 DIAGNOSIS — I10 Essential (primary) hypertension: Secondary | ICD-10-CM

## 2017-03-28 LAB — POCT GLYCOSYLATED HEMOGLOBIN (HGB A1C)
Est. average glucose Bld gHb Est-mCnc: 126
Hemoglobin A1C: 6

## 2017-03-28 LAB — POCT UA - MICROALBUMIN: Microalbumin Ur, POC: 20 mg/L

## 2017-03-28 NOTE — Progress Notes (Signed)
Patient: Robin Ashley Female    DOB: February 09, 1932   83 y.o.   MRN: [id number] Visit Date: 03/28/2017  Today's Provider: Mar Daring, PA-C   Chief Complaint  Patient presents with  . Diabetes  . Hypertension  . Hyperlipidemia   Subjective:    HPI  Diabetes Mellitus Type II, Follow-up:   Lab Results  Component Value Date   HGBA1C 6.0 03/28/2017   HGBA1C 6.6 (H) 09/26/2016   HGBA1C 6.8 06/18/2016   Last seen for diabetes 6 months ago.  Management since then includes no changes. She reports excellent compliance with treatment. She is not having side effects.  Current symptoms include none and have been stable. Home blood sugar records: not being checked  Episodes of hypoglycemia? no   Current Insulin Regimen:  Most Recent Eye Exam: UTD Weight trend: stable Prior visit with dietician: no Current diet: in general, a "healthy" diet   Current exercise: housecleaning  ------------------------------------------------------------------------   Hypertension, follow-up:  BP Readings from Last 3 Encounters:  03/28/17 140/80  03/19/17 (!) 186/80  09/29/16 (!) 154/69    She was last seen for hypertension 6 months ago.  BP at that visit was 156/76. Management since that visit includes no changes.She reports excellent compliance with treatment. She is not having side effects.  She is not exercising. She is adherent to low salt diet.   Outside blood pressures are not being checked. She is experiencing lower extremity edema.  Patient denies chest pain.   Cardiovascular risk factors include advanced age (older than 45 for men, 7 for women), diabetes mellitus, hypertension and obesity (BMI >= 30 kg/m2).  Use of agents associated with hypertension: none.   ------------------------------------------------------------------------    Lipid/Cholesterol, Follow-up:   Last seen for this 6 months ago.  Management since that visit includes no  changes.  Last Lipid Panel:    Component Value Date/Time   CHOL 158 03/06/2016 0925   TRIG 170 (H) 03/06/2016 0925   HDL 47 03/06/2016 0925   CHOLHDL 3.4 03/06/2016 0925   LDLCALC 77 03/06/2016 0925    She reports excellent compliance with treatment. She is not having side effects.   Wt Readings from Last 3 Encounters:  03/28/17 209 lb (94.8 kg)  03/19/17 209 lb 3.2 oz (94.9 kg)  09/27/16 221 lb 11.2 oz (100.6 kg)   ------------------------------------------------------------------------    No Known Allergies   Current Outpatient Prescriptions:  .  amLODipine (NORVASC) 5 MG tablet, , Disp: , Rfl:  .  aspirin 81 MG tablet, Take 1 tablet (81 mg total) by mouth daily., Disp: 30 tablet, Rfl: 0 .  atorvastatin (LIPITOR) 80 MG tablet, Take 1 tablet (80 mg total) by mouth daily., Disp: 90 tablet, Rfl: 3 .  ferrous sulfate ([employer] IRON) 325 (65 FE) MG tablet, Take 1 tablet (325 mg total) by mouth 2 (two) times daily., Disp: 180 tablet, Rfl: 1 .  furosemide (LASIX) 20 MG tablet, TAKE 1 TABLET DAILY, Disp: 90 tablet, Rfl: 1 .  HYDROcodone-acetaminophen (NORCO/VICODIN) 5-325 MG tablet, Take 1-2 tablets by mouth every 4 (four) hours as needed for moderate pain., Disp: 30 tablet, Rfl: 0 .  letrozole (FEMARA) 2.5 MG tablet, TAKE 1 TABLET DAILY, Disp: 90 tablet, Rfl: 1 .  lisinopril (PRINIVIL,ZESTRIL) 20 MG tablet, , Disp: , Rfl:  .  meloxicam (MOBIC) 15 MG tablet, TAKE 1 TABLET DAILY, Disp: 90 tablet, Rfl: 1 .  metFORMIN (GLUCOPHAGE) 500 MG tablet, Take 1 tablet (500 mg total)  by mouth 2 (two) times daily., Disp: 180 tablet, Rfl: 3 .  Multiple Vitamins-Minerals (CENTRUM SILVER PO), Take 1 tablet by mouth daily., Disp: , Rfl:  .  omeprazole (PRILOSEC) 20 MG capsule, TAKE 1 CAPSULE DAILY, Disp: 90 capsule, Rfl: 4 .  polyethylene glycol powder (GLYCOLAX/MIRALAX) powder, , Disp: , Rfl: 0 .  traZODone (DESYREL) 50 MG tablet, TAKE 1 TABLET EVERY EVENING, Disp: 90 tablet, Rfl: 3  Review of Systems   Constitutional: Negative.   HENT: Negative.   Respiratory: Positive for shortness of breath.   Cardiovascular: Positive for leg swelling.    Social History  Substance Use Topics  . Smoking status: Never Smoker  . Smokeless tobacco: Never Used  . Alcohol use No   Objective:   BP 140/80 (BP Location: Left Arm, Patient Position: Sitting, Cuff Size: Large)   Pulse 87   Temp 98.1 F (36.7 C) (Oral)   Resp 20   Ht 5\' 7"  (1.702 m)   Wt 209 lb (94.8 kg)   SpO2 96%   BMI 32.73 kg/m  Vitals:   03/28/17 0810  BP: 140/80  Pulse: 87  Resp: 20  Temp: 98.1 F (36.7 C)  TempSrc: Oral  SpO2: 96%  Weight: 209 lb (94.8 kg)  Height: 5\' 7"  (1.702 m)     Physical Exam  Constitutional: She appears well-developed and well-nourished. No distress.  Neck: Normal range of motion. Neck supple. No JVD present. No tracheal deviation present. No thyromegaly present.  Cardiovascular: Normal rate, regular rhythm and normal heart sounds.  Exam reveals no gallop and no friction rub.   No murmur heard. Pulmonary/Chest: Effort normal and breath sounds normal. No respiratory distress. She has no wheezes. She has no rales.  Lymphadenopathy:    She has no cervical adenopathy.  Skin: She is not diaphoretic.  Vitals reviewed.      Assessment & Plan:     1. Type 2 diabetes mellitus without complication, without long-term current use of insulin (HCC) A1c improved to 6.0, microalbumin normal at 20 and patient on lisinopril. Continue metformin 500mg  BID. I will see her back in 6 months. Of note patient has had a weight loss of 12 pounds since April 2018 without trying. She denies any symptoms. States that since she lives alone sometimes she just doesn't eat every meal. She always has a good breakfast and sometimes then does not eat again until supper time. Will monitor.  - POCT UA - Microalbumin - POCT glycosylated hemoglobin (Hb A1C)  2. Essential hypertension Stable. Followed by Dr. Ubaldo Glassing. Continue  amlodipine 5mg , lisinopril 20mg .   3. Hypercholesteremia Stable. Patient wishes to wait until April 2019 to recheck lab when she is due for all her other labs. Continue atorvastatin 80mg .   4. Need for influenza vaccination Flu vaccine given today without complication. Patient sat upright for 15 minutes to check for adverse reaction before being released. - Flu vaccine HIGH DOSE PF       Mar Daring, PA-C  [facility]

## 2017-03-28 NOTE — Patient Instructions (Signed)

## 2017-04-16 ENCOUNTER — Ambulatory Visit
Admission: RE | Admit: 2017-04-16 | Discharge: 2017-04-16 | Disposition: A | Discharge status: Home / Self Care | Payer: Medicare Other | Source: Ambulatory Visit | Attending: General Surgery | Admitting: General Surgery

## 2017-04-16 DIAGNOSIS — C50012 Malignant neoplasm of nipple and areola, left female breast: Secondary | ICD-10-CM

## 2017-04-16 DIAGNOSIS — M810 Age-related osteoporosis without current pathological fracture: Secondary | ICD-10-CM

## 2017-04-16 DIAGNOSIS — Z170 Estrogen receptor positive status [ER+]: Secondary | ICD-10-CM

## 2017-04-16 DIAGNOSIS — Z780 Asymptomatic menopausal state: Secondary | ICD-10-CM | POA: Diagnosis not present

## 2017-04-16 DIAGNOSIS — Z853 Personal history of malignant neoplasm of breast: Secondary | ICD-10-CM | POA: Insufficient documentation

## 2017-04-24 ENCOUNTER — Other Ambulatory Visit: Payer: Self-pay | Admitting: General Surgery

## 2017-04-27 ENCOUNTER — Other Ambulatory Visit: Payer: Self-pay | Admitting: Physician Assistant

## 2017-04-27 DIAGNOSIS — C50912 Malignant neoplasm of unspecified site of left female breast: Secondary | ICD-10-CM

## 2017-04-29 ENCOUNTER — Telehealth: Payer: Self-pay | Admitting: Physician Assistant

## 2017-04-29 ENCOUNTER — Telehealth: Payer: Self-pay | Admitting: *Deleted

## 2017-04-29 NOTE — Telephone Encounter (Signed)
05/01/17 

## 2017-04-29 NOTE — Telephone Encounter (Signed)
Notified patient as instructed, patient agrees. Patient is aware that Dr.Byrnett will be contacting her PCP [name] about the details. Patient is also aware of her appointment 05/01/17 with [name]

## 2017-04-29 NOTE — Telephone Encounter (Signed)
Can we call patient and have her schedule an appt for osteoporosis discussion?   Thanks.

## 2017-04-29 NOTE — Telephone Encounter (Signed)
-----   Message from Robert Bellow, MD sent at 04/29/2017 11:37 AM EST ----- The patient's recent bone density shows a shift from osteopenia to osteoporosis.  Record review does not show that she is making use of any calcium supplements, I do not know if these were stopped because of her slowly declining renal function.  She probably needs to be on a bisphosphonate such as Fosamax.  Would you like me to prescribe her would like to contact the patient?

## 2017-04-29 NOTE — Telephone Encounter (Signed)
-----   Message from Robert Bellow, MD sent at 04/29/2017 11:38 AM EST ----- Please notify the patient her bone density shows that her bones have gotten thinner, and will likely need to add some medication to improve that situation.  We will let her know the details after I talk with Ms. Marlyn Corporal at [facility].

## 2017-05-01 ENCOUNTER — Other Ambulatory Visit: Payer: Self-pay

## 2017-05-01 ENCOUNTER — Ambulatory Visit (INDEPENDENT_AMBULATORY_CARE_PROVIDER_SITE_OTHER): Payer: Medicare Other | Admitting: Physician Assistant

## 2017-05-01 ENCOUNTER — Encounter: Payer: Self-pay | Admitting: Physician Assistant

## 2017-05-01 VITALS — BP 128/68 | HR 92 | Temp 97.8°F | Resp 16 | Ht 67.0 in | Wt 209.0 lb

## 2017-05-01 DIAGNOSIS — M810 Age-related osteoporosis without current pathological fracture: Secondary | ICD-10-CM | POA: Diagnosis not present

## 2017-05-01 MED ORDER — ALENDRONATE SODIUM 70 MG PO TABS: 70.0000 mg | ORAL_TABLET | ORAL | 3 refills | Status: DC

## 2017-05-01 NOTE — Progress Notes (Signed)
Patient: Robin Ashley Female    DOB: 1932/03/02   83 y.o.   MRN: [id number] Visit Date: 05/01/2017  Today's Provider: Mar Daring, PA-C   Chief Complaint  Patient presents with  . Follow-up   Subjective:    HPI Patient here today to fu on bone density scan result done on 04/16/2017. Patient reports she does take a multivitamin daily. Patient reports she does not participate in any structured or home exercise routine. Tscore is -3.3 of left femur. This has been a decline from osteopenia to osteoporosis. Patient is on Letrazole secondary to breast cancer.     No Known Allergies   Current Outpatient Medications:  .  amLODipine (NORVASC) 5 MG tablet, , Disp: , Rfl:  .  aspirin 81 MG tablet, Take 1 tablet (81 mg total) by mouth daily., Disp: 30 tablet, Rfl: 0 .  atorvastatin (LIPITOR) 80 MG tablet, Take 1 tablet (80 mg total) by mouth daily., Disp: 90 tablet, Rfl: 3 .  ferrous sulfate ([employer]S IRON) 325 (65 FE) MG tablet, Take 1 tablet (325 mg total) by mouth 2 (two) times daily., Disp: 180 tablet, Rfl: 1 .  furosemide (LASIX) 20 MG tablet, TAKE 1 TABLET DAILY, Disp: 90 tablet, Rfl: 1 .  HYDROcodone-acetaminophen (NORCO/VICODIN) 5-325 MG tablet, Take 1-2 tablets by mouth every 4 (four) hours as needed for moderate pain., Disp: 30 tablet, Rfl: 0 .  letrozole (FEMARA) 2.5 MG tablet, TAKE 1 TABLET DAILY, Disp: 90 tablet, Rfl: 1 .  lisinopril (PRINIVIL,ZESTRIL) 20 MG tablet, , Disp: , Rfl:  .  meloxicam (MOBIC) 15 MG tablet, TAKE 1 TABLET DAILY, Disp: 90 tablet, Rfl: 1 .  metFORMIN (GLUCOPHAGE) 500 MG tablet, Take 1 tablet (500 mg total) by mouth 2 (two) times daily., Disp: 180 tablet, Rfl: 3 .  Multiple Vitamins-Minerals (CENTRUM SILVER PO), Take 1 tablet by mouth daily., Disp: , Rfl:  .  omeprazole (PRILOSEC) 20 MG capsule, TAKE 1 CAPSULE DAILY, Disp: 90 capsule, Rfl: 4 .  polyethylene glycol powder (GLYCOLAX/MIRALAX) powder, , Disp: , Rfl: 0 .  traZODone (DESYREL) 50 MG  tablet, TAKE 1 TABLET EVERY EVENING, Disp: 90 tablet, Rfl: 3  Review of Systems  Constitutional: Negative.   Respiratory: Negative.   Cardiovascular: Negative.   Gastrointestinal: Negative.   Musculoskeletal: Negative.   Neurological: Negative.     Social History   Tobacco Use  . Smoking status: Never Smoker  . Smokeless tobacco: Never Used  Substance Use Topics  . Alcohol use: No   Objective:   BP 128/68 (BP Location: Left Arm, Patient Position: Sitting, Cuff Size: Large)   Pulse 92   Temp 97.8 F (36.6 C) (Oral)   Resp 16   Ht 5\' 7"  (1.702 m)   Wt 209 lb (94.8 kg)   SpO2 99%   BMI 32.73 kg/m  Vitals:   05/01/17 0927  BP: 128/68  Pulse: 92  Resp: 16  Temp: 97.8 F (36.6 C)  TempSrc: Oral  SpO2: 99%  Weight: 209 lb (94.8 kg)  Height: 5\' 7"  (1.702 m)     Physical Exam  Constitutional: She appears well-developed and well-nourished. No distress.  Neck: Normal range of motion. Neck supple.  Cardiovascular: Normal rate, regular rhythm and normal heart sounds. Exam reveals no gallop and no friction rub.  No murmur heard. Pulmonary/Chest: Effort normal and breath sounds normal. No respiratory distress. She has no wheezes. She has no rales.  Skin: She is not diaphoretic.  Vitals reviewed.  EXAM: DUAL X-RAY ABSORPTIOMETRY (DXA) FOR BONE MINERAL DENSITY  IMPRESSION: Dear Dr. Bary Castilla,  Your patient Robin Ashley completed a BMD test on 04/16/2017 using the [place] (analysis version: 14.10) manufactured by [employer]. The following summarizes the results of our evaluation.  PATIENT BIOGRAPHICAL: Name: Robin Ashley, Robin Ashley Patient ID: [id number] Birth Date: [birth year] Height: 63.5 in. Gender: Female Exam Date: 04/16/2017 Weight: 207.0 lbs. Indications: Advanced Age, Caucasian, Diabetic, Height Loss, History of Breast Cancer, History of Osteoporosis, Postmenopausal Fractures: Treatments: Letrozole, Metformin,  Multi-Vitamin  ASSESSMENT: The BMD measured at Femur Total Left is 0.593 g/cm2 with a T-score of -3.3. This patient is considered osteoporotic according to [facility]) criteria. Lumbar spine was not utilized due to advanced degenerative changes.  Site Region Measured Measured WHO Young Adult BMD Date       Age      Classification T-score DualFemur Total Left 04/16/2017 83.3 Osteoporosis -3.3 0.593 g/cm2 DualFemur Total Left 04/14/2014 80.3 Osteopenia -2.3 0.721 g/cm2  Left Forearm Radius 33% 04/16/2017 83.3 Osteoporosis -2.7 0.635 g/cm2 Left Forearm Radius 33% 04/14/2014 80.3 Osteopenia -1.5 0.743 g/cm2  World Health Organization [facility]) criteria for post-menopausal, Caucasian Women: Normal:       T-score at or above -1 SD Osteopenia:   T-score between -1 and -2.5 SD Osteoporosis: T-score at or below -2.5 SD RECOMMENDATIONS: [place] recommends that FDA-approved medical therapies be considered in postmenopausal women and men age 53 or older with a: 1. Hip or vertebral (clinical or morphometric) fracture. 2. T-score of < -2.5 at the spine or hip. 3. Ten-year fracture probability by FRAX of 3% or greater for hip fracture or 20% or greater for major osteoporotic fracture.  All treatment decisions require clinical judgment and consideration of individual patient factors, including patient preferences, co-morbidities, previous drug use, risk factors not captured in the FRAX model (e.g. falls, vitamin D deficiency, increased bone turnover, interval significant decline in bone density) and possible under - or over-estimation of fracture risk by FRAX.  All patients should ensure an adequate intake of dietary calcium (1200 mg/d) and vitamin D (800 IU daily) unless contraindicated.  FOLLOW-UP: People with diagnosed cases of osteoporosis or at high risk for fracture should have regular bone mineral density tests. For patients eligible  for Medicare, routine testing is allowed once every 2 years. The testing frequency can be increased to one year for patients who have rapidly progressing disease, those who are receiving or discontinuing medical therapy to restore bone mass, or have additional risk factors.  I have reviewed this report, and agree with the above findings.  [facility]   Electronically Signed   By: Lowella Grip III M.D.   On: 04/16/2017 09:19     Assessment & Plan:     1. Age-related osteoporosis without current pathological fracture Patient agreeable to Fosamax. This has been started as below. She is to continue her multi-vitamin and given written information for how much calcium and vitamin d to take daily. Would recommend repeat BMD next year to verify fosamax is improving bone health. She is to call if she has any adverse reaction or acute issue.  - alendronate (FOSAMAX) 70 MG tablet; Take 1 tablet (70 mg total) by mouth every 7 (seven) days. Take with a full glass of water on an empty stomach.  Dispense: 12 tablet; Refill: [place], PA-C  [facility]

## 2017-05-01 NOTE — Patient Instructions (Signed)
Calcium 1200mg  daily Vit D 800 IU daily   Osteoporosis Osteoporosis is the thinning and loss of density in the bones. Osteoporosis makes the bones more brittle, fragile, and likely to break (fracture). Over time, osteoporosis can cause the bones to become so weak that they fracture after a simple fall. The bones most likely to fracture are the bones in the hip, wrist, and spine. What are the causes? The exact cause is not known. What increases the risk? Anyone can develop osteoporosis. You may be at greater risk if you have a family history of the condition or have poor nutrition. You may also have a higher risk if you are:  Female.  81 years old or older.  A smoker.  Not physically active.  White or Asian.  Slender.  What are the signs or symptoms? A fracture might be the first sign of the disease, especially if it results from a fall or injury that would not usually cause a bone to break. Other signs and symptoms include:  Low back and neck pain.  Stooped posture.  Height loss.  How is this diagnosed? To make a diagnosis, your health care provider may:  Take a medical history.  Perform a physical exam.  Order tests, such as: ? A bone mineral density test. ? A dual-energy X-ray absorptiometry test.  How is this treated? The goal of osteoporosis treatment is to strengthen your bones to reduce your risk of a fracture. Treatment may involve:  Making lifestyle changes, such as: ? Eating a diet rich in calcium. ? Doing weight-bearing and muscle-strengthening exercises. ? Stopping tobacco use. ? Limiting alcohol intake.  Taking medicine to slow the process of bone loss or to increase bone density.  Monitoring your levels of calcium and vitamin D.  Follow these instructions at home:  Include calcium and vitamin D in your diet. Calcium is important for bone health, and vitamin D helps the body absorb calcium.  Perform weight-bearing and muscle-strengthening  exercises as directed by your health care provider.  Do not use any tobacco products, including cigarettes, chewing tobacco, and electronic cigarettes. If you need help quitting, ask your health care provider.  Limit your alcohol intake.  Take medicines only as directed by your health care provider.  Keep all follow-up visits as directed by your health care provider. This is important.  Take precautions at home to lower your risk of falling, such as: ? Keeping rooms well lit and clutter free. ? Installing safety rails on stairs. ? Using rubber mats in the bathroom and other areas that are often wet or slippery. Get help right away if: You fall or injure yourself. This information is not intended to replace advice given to you by your health care provider. Make sure you discuss any questions you have with your health care provider. Document Released: 02/27/2005 Document Revised: 10/23/2015 Document Reviewed: 10/28/2013 Elsevier Interactive Patient Education  2017 [place].   Alendronate tablets What is this medicine? ALENDRONATE (a LEN droe nate) slows calcium loss from bones. It helps to make normal healthy bone and to slow bone loss in people with Paget's disease and osteoporosis. It may be used in others at risk for bone loss. This medicine may be used for other purposes; ask your health care provider or pharmacist if you have questions. COMMON BRAND NAME(S): Fosamax What should I tell my health care provider before I take this medicine? They need to know if you have any of these conditions: -dental disease -esophagus, stomach,  or intestine problems, like acid reflux or GERD -kidney disease -low blood calcium -low vitamin D -problems sitting or standing 30 minutes -trouble swallowing -an unusual or allergic reaction to alendronate, other medicines, foods, dyes, or preservatives -pregnant or trying to get pregnant -breast-feeding How should I use this medicine? You must  take this medicine exactly as directed or you will lower the amount of the medicine you absorb into your body or you may cause yourself harm. Take this medicine by mouth first thing in the morning, after you are up for the day. Do not eat or drink anything before you take your medicine. Swallow the tablet with a full glass (6 to 8 fluid ounces) of plain water. Do not take this medicine with any other drink. Do not chew or crush the tablet. After taking this medicine, do not eat breakfast, drink, or take any medicines or vitamins for at least 30 minutes. Sit or stand up for at least 30 minutes after you take this medicine; do not lie down. Do not take your medicine more often than directed. Talk to your pediatrician regarding the use of this medicine in children. Special care may be needed. Overdosage: If you think you have taken too much of this medicine contact a poison control center or emergency room at once. NOTE: This medicine is only for you. Do not share this medicine with others. What if I miss a dose? If you miss a dose, do not take it later in the day. Continue your normal schedule starting the next morning. Do not take double or extra doses. What may interact with this medicine? -aluminum hydroxide -antacids -aspirin -calcium supplements -drugs for inflammation like ibuprofen, naproxen, and others -iron supplements -magnesium supplements -vitamins with minerals This list may not describe all possible interactions. Give your health care provider a list of all the medicines, herbs, non-prescription drugs, or dietary supplements you use. Also tell them if you smoke, drink alcohol, or use illegal drugs. Some items may interact with your medicine. What should I watch for while using this medicine? Visit your doctor or health care professional for regular checks ups. It may be some time before you see benefit from this medicine. Do not stop taking your medicine except on your doctor's advice.  Your doctor or health care professional may order blood tests and other tests to see how you are doing. You should make sure you get enough calcium and vitamin D while you are taking this medicine, unless your doctor tells you not to. Discuss the foods you eat and the vitamins you take with your health care professional. Some people who take this medicine have severe bone, joint, and/or muscle pain. This medicine may also increase your risk for a broken thigh bone. Tell your doctor right away if you have pain in your upper leg or groin. Tell your doctor if you have any pain that does not go away or that gets worse. This medicine can make you more sensitive to the sun. If you get a rash while taking this medicine, sunlight may cause the rash to get worse. Keep out of the sun. If you cannot avoid being in the sun, wear protective clothing and use sunscreen. Do not use sun lamps or tanning beds/booths. What side effects may I notice from receiving this medicine? Side effects that you should report to your doctor or health care professional as soon as possible: -allergic reactions like skin rash, itching or hives, swelling of the face, lips, or tongue -  black or tarry stools -bone, muscle or joint pain -changes in vision -chest pain -heartburn or stomach pain -jaw pain, especially after dental work -pain or trouble when swallowing -redness, blistering, peeling or loosening of the skin, including inside the mouth Side effects that usually do not require medical attention (report to your doctor or health care professional if they continue or are bothersome): -changes in taste -diarrhea or constipation -eye pain or itching -headache -nausea or vomiting -stomach gas or fullness This list may not describe all possible side effects. Call your doctor for medical advice about side effects. You may report side effects to FDA at 1-800-FDA-1088. Where should I keep my medicine? Keep out of the reach of  children. Store at room temperature of 15 and 30 degrees C (59 and 86 degrees F). Throw away any unused medicine after the expiration date. NOTE: This sheet is a summary. It may not cover all possible information. If you have questions about this medicine, talk to your doctor, pharmacist, or health care provider.  2018 Elsevier/Gold Standard (2010-11-16 08:56:09)

## 2017-05-02 ENCOUNTER — Telehealth: Payer: Self-pay | Admitting: Physician Assistant

## 2017-05-02 DIAGNOSIS — M810 Age-related osteoporosis without current pathological fracture: Secondary | ICD-10-CM

## 2017-05-02 MED ORDER — ALENDRONATE SODIUM 70 MG PO TABS: 70.0000 mg | ORAL_TABLET | ORAL | 3 refills | Status: DC

## 2017-05-02 NOTE — Telephone Encounter (Signed)
Pt is requesting the Rx alendronate (FOSAMAX) 70 MG tablet  for resent to [facility]. due to insurance coverage.  CB#[phone]/MW

## 2017-05-02 NOTE — Telephone Encounter (Signed)
This has been resent to [facility]

## 2017-05-02 NOTE — Telephone Encounter (Signed)
Error/MW °

## 2017-07-10 ENCOUNTER — Other Ambulatory Visit: Payer: Self-pay | Admitting: Physician Assistant

## 2017-07-10 DIAGNOSIS — M810 Age-related osteoporosis without current pathological fracture: Secondary | ICD-10-CM

## 2017-07-10 MED ORDER — ALENDRONATE SODIUM 70 MG PO TABS: 70.0000 mg | ORAL_TABLET | ORAL | 3 refills | Status: DC

## 2017-07-10 NOTE — Telephone Encounter (Signed)
[facility] faxed a refill request for a 90-days supply for the following medication. Thanks CC  alendronate (FOSAMAX) 70 MG tablet

## 2017-07-16 DIAGNOSIS — H61031 Chondritis of right external ear: Secondary | ICD-10-CM | POA: Diagnosis not present

## 2017-08-13 ENCOUNTER — Ambulatory Visit: Payer: Self-pay

## 2017-08-17 ENCOUNTER — Other Ambulatory Visit: Payer: Self-pay | Admitting: Family Medicine

## 2017-08-17 DIAGNOSIS — R600 Localized edema: Secondary | ICD-10-CM

## 2017-09-16 DIAGNOSIS — G4733 Obstructive sleep apnea (adult) (pediatric): Secondary | ICD-10-CM | POA: Diagnosis not present

## 2017-09-16 DIAGNOSIS — I10 Essential (primary) hypertension: Secondary | ICD-10-CM | POA: Diagnosis not present

## 2017-09-16 DIAGNOSIS — E782 Mixed hyperlipidemia: Secondary | ICD-10-CM | POA: Diagnosis not present

## 2017-09-29 ENCOUNTER — Encounter: Payer: Self-pay | Admitting: Physician Assistant

## 2017-09-30 ENCOUNTER — Ambulatory Visit (INDEPENDENT_AMBULATORY_CARE_PROVIDER_SITE_OTHER): Payer: Medicare Other

## 2017-09-30 ENCOUNTER — Encounter: Payer: Self-pay | Admitting: Physician Assistant

## 2017-09-30 ENCOUNTER — Ambulatory Visit (INDEPENDENT_AMBULATORY_CARE_PROVIDER_SITE_OTHER): Payer: Medicare Other | Admitting: Physician Assistant

## 2017-09-30 VITALS — BP 156/64 | HR 88 | Temp 98.6°F | Ht 67.0 in | Wt 213.6 lb

## 2017-09-30 VITALS — BP 156/64 | HR 67 | Temp 98.6°F | Resp 16 | Ht 67.0 in | Wt 213.0 lb

## 2017-09-30 DIAGNOSIS — E7800 Pure hypercholesterolemia, unspecified: Secondary | ICD-10-CM

## 2017-09-30 DIAGNOSIS — Z0000 Encounter for general adult medical examination without abnormal findings: Secondary | ICD-10-CM | POA: Diagnosis not present

## 2017-09-30 DIAGNOSIS — I10 Essential (primary) hypertension: Secondary | ICD-10-CM | POA: Diagnosis not present

## 2017-09-30 DIAGNOSIS — E119 Type 2 diabetes mellitus without complications: Secondary | ICD-10-CM | POA: Diagnosis not present

## 2017-09-30 NOTE — Patient Instructions (Signed)
Health Maintenance for Postmenopausal Women Menopause is a normal process in which your reproductive ability comes to an end. This process happens gradually over a span of months to years, usually between the ages of 22 and 9. Menopause is complete when you have missed 12 consecutive menstrual periods. It is important to talk with your health care provider about some of the most common conditions that affect postmenopausal women, such as heart disease, cancer, and bone loss (osteoporosis). Adopting a healthy lifestyle and getting preventive care can help to promote your health and wellness. Those actions can also lower your chances of developing some of these common conditions. What should I know about menopause? During menopause, you may experience a number of symptoms, such as:  Moderate-to-severe hot flashes.  Night sweats.  Decrease in sex drive.  Mood swings.  Headaches.  Tiredness.  Irritability.  Memory problems.  Insomnia.  Choosing to treat or not to treat menopausal changes is an individual decision that you make with your health care provider. What should I know about hormone replacement therapy and supplements? Hormone therapy products are effective for treating symptoms that are associated with menopause, such as hot flashes and night sweats. Hormone replacement carries certain risks, especially as you become older. If you are thinking about using estrogen or estrogen with progestin treatments, discuss the benefits and risks with your health care provider. What should I know about heart disease and stroke? Heart disease, heart attack, and stroke become more likely as you age. This may be due, in part, to the hormonal changes that your body experiences during menopause. These can affect how your body processes dietary fats, triglycerides, and cholesterol. Heart attack and stroke are both medical emergencies. There are many things that you can do to help prevent heart disease  and stroke:  Have your blood pressure checked at least every 1-2 years. High blood pressure causes heart disease and increases the risk of stroke.  If you are 53-22 years old, ask your health care provider if you should take aspirin to prevent a heart attack or a stroke.  Do not use any tobacco products, including cigarettes, chewing tobacco, or electronic cigarettes. If you need help quitting, ask your health care provider.  It is important to eat a healthy diet and maintain a healthy weight. ? Be sure to include plenty of vegetables, fruits, low-fat dairy products, and lean protein. ? Avoid eating foods that are high in solid fats, added sugars, or salt (sodium).  Get regular exercise. This is one of the most important things that you can do for your health. ? Try to exercise for at least 150 minutes each week. The type of exercise that you do should increase your heart rate and make you sweat. This is known as moderate-intensity exercise. ? Try to do strengthening exercises at least twice each week. Do these in addition to the moderate-intensity exercise.  Know your numbers.Ask your health care provider to check your cholesterol and your blood glucose. Continue to have your blood tested as directed by your health care provider.  What should I know about cancer screening? There are several types of cancer. Take the following steps to reduce your risk and to catch any cancer development as early as possible. Breast Cancer  Practice breast self-awareness. ? This means understanding how your breasts normally appear and feel. ? It also means doing regular breast self-exams. Let your health care provider know about any changes, no matter how small.  If you are 40  or older, have a clinician do a breast exam (clinical breast exam or CBE) every year. Depending on your age, family history, and medical history, it may be recommended that you also have a yearly breast X-ray (mammogram).  If you  have a family history of breast cancer, talk with your health care provider about genetic screening.  If you are at high risk for breast cancer, talk with your health care provider about having an MRI and a mammogram every year.  Breast cancer (BRCA) gene test is recommended for women who have family members with BRCA-related cancers. Results of the assessment will determine the need for genetic counseling and BRCA1 and for BRCA2 testing. BRCA-related cancers include these types: ? Breast. This occurs in males or females. ? Ovarian. ? Tubal. This may also be called fallopian tube cancer. ? Cancer of the abdominal or pelvic lining (peritoneal cancer). ? Prostate. ? Pancreatic.  Cervical, Uterine, and Ovarian Cancer Your health care provider may recommend that you be screened regularly for cancer of the pelvic organs. These include your ovaries, uterus, and vagina. This screening involves a pelvic exam, which includes checking for microscopic changes to the surface of your cervix (Pap test).  For women ages 21-65, health care providers may recommend a pelvic exam and a Pap test every three years. For women ages 79-65, they may recommend the Pap test and pelvic exam, combined with testing for human papilloma virus (HPV), every five years. Some types of HPV increase your risk of cervical cancer. Testing for HPV may also be done on women of any age who have unclear Pap test results.  Other health care providers may not recommend any screening for nonpregnant women who are considered low risk for pelvic cancer and have no symptoms. Ask your health care provider if a screening pelvic exam is right for you.  If you have had past treatment for cervical cancer or a condition that could lead to cancer, you need Pap tests and screening for cancer for at least 20 years after your treatment. If Pap tests have been discontinued for you, your risk factors (such as having a new sexual partner) need to be  reassessed to determine if you should start having screenings again. Some women have medical problems that increase the chance of getting cervical cancer. In these cases, your health care provider may recommend that you have screening and Pap tests more often.  If you have a family history of uterine cancer or ovarian cancer, talk with your health care provider about genetic screening.  If you have vaginal bleeding after reaching menopause, tell your health care provider.  There are currently no reliable tests available to screen for ovarian cancer.  Lung Cancer Lung cancer screening is recommended for adults 69-62 years old who are at high risk for lung cancer because of a history of smoking. A yearly low-dose CT scan of the lungs is recommended if you:  Currently smoke.  Have a history of at least 30 pack-years of smoking and you currently smoke or have quit within the past 15 years. A pack-year is smoking an average of one pack of cigarettes per day for one year.  Yearly screening should:  Continue until it has been 15 years since you quit.  Stop if you develop a health problem that would prevent you from having lung cancer treatment.  Colorectal Cancer  This type of cancer can be detected and can often be prevented.  Routine colorectal cancer screening usually begins at  age 42 and continues through age 45.  If you have risk factors for colon cancer, your health care provider may recommend that you be screened at an earlier age.  If you have a family history of colorectal cancer, talk with your health care provider about genetic screening.  Your health care provider may also recommend using home test kits to check for hidden blood in your stool.  A small camera at the end of a tube can be used to examine your colon directly (sigmoidoscopy or colonoscopy). This is done to check for the earliest forms of colorectal cancer.  Direct examination of the colon should be repeated every  5-10 years until age 71. However, if early forms of precancerous polyps or small growths are found or if you have a family history or genetic risk for colorectal cancer, you may need to be screened more often.  Skin Cancer  Check your skin from head to toe regularly.  Monitor any moles. Be sure to tell your health care provider: ? About any new moles or changes in moles, especially if there is a change in a mole's shape or color. ? If you have a mole that is larger than the size of a pencil eraser.  If any of your family members has a history of skin cancer, especially at a young age, talk with your health care provider about genetic screening.  Always use sunscreen. Apply sunscreen liberally and repeatedly throughout the day.  Whenever you are outside, protect yourself by wearing long sleeves, pants, a wide-brimmed hat, and sunglasses.  What should I know about osteoporosis? Osteoporosis is a condition in which bone destruction happens more quickly than new bone creation. After menopause, you may be at an increased risk for osteoporosis. To help prevent osteoporosis or the bone fractures that can happen because of osteoporosis, the following is recommended:  If you are 46-71 years old, get at least 1,000 mg of calcium and at least 600 mg of vitamin D per day.  If you are older than age 55 but younger than age 65, get at least 1,200 mg of calcium and at least 600 mg of vitamin D per day.  If you are older than age 54, get at least 1,200 mg of calcium and at least 800 mg of vitamin D per day.  Smoking and excessive alcohol intake increase the risk of osteoporosis. Eat foods that are rich in calcium and vitamin D, and do weight-bearing exercises several times each week as directed by your health care provider. What should I know about how menopause affects my mental health? Depression may occur at any age, but it is more common as you become older. Common symptoms of depression  include:  Low or sad mood.  Changes in sleep patterns.  Changes in appetite or eating patterns.  Feeling an overall lack of motivation or enjoyment of activities that you previously enjoyed.  Frequent crying spells.  Talk with your health care provider if you think that you are experiencing depression. What should I know about immunizations? It is important that you get and maintain your immunizations. These include:  Tetanus, diphtheria, and pertussis (Tdap) booster vaccine.  Influenza every year before the flu season begins.  Pneumonia vaccine.  Shingles vaccine.  Your health care provider may also recommend other immunizations. This information is not intended to replace advice given to you by your health care provider. Make sure you discuss any questions you have with your health care provider. Document Released: 07/12/2005  Document Revised: 12/08/2015 Document Reviewed: 02/21/2015 Elsevier Interactive Patient Education  2018 [employer] Inc.  

## 2017-09-30 NOTE — Progress Notes (Signed)
Subjective:   Robin Ashley is a 84 y.o. female who presents for Medicare Annual (Subsequent) preventive examination.  Review of Systems:  N/A  Cardiac Risk Factors include: advanced age (>11mn, >>77women);diabetes mellitus;dyslipidemia;hypertension;obesity (BMI >30kg/m2)     Objective:     Vitals: BP (!) 156/64 (BP Location: Left Arm)   Pulse 88   Temp 98.6 F (37 C) (Oral)   Ht 5' 7"  (1.702 m)   Wt 213 lb 9.6 oz (96.9 kg)   BMI 33.45 kg/m   Body mass index is 33.45 kg/m.  Advanced Directives 09/30/2017 09/27/2016 09/27/2016 09/26/2016 12/27/2015 10/16/2015 06/19/2015  Does Patient Have a Medical Advance Directive? Yes No Yes Yes Yes Yes Yes  Type of [place] - Living will Living will Living will;Healthcare Power of [place]  Does patient want to make changes to medical advance directive? - - - - No - Patient declined - -  Copy of [place]? No - copy requested - - - - - -  Would patient like information on creating a medical advance directive? - No - Patient declined - - - - -    Tobacco Social History   Tobacco Use  Smoking Status Never Smoker  Smokeless Tobacco Never Used     Counseling given: Not Answered   Clinical Intake:  Pre-visit preparation completed: Yes  Pain : No/denies pain Pain Score: 0-No pain     Nutritional Status: BMI > 30  Obese Nutritional Risks: None Diabetes: Yes(type 2) CBG done?: No Did pt. bring in CBG monitor from home?: No  How often do you need to have someone help you when you read instructions, pamphlets, or other written materials from your doctor or pharmacy?: 1 - Never  Interpreter Needed?: No  [facility]  Past Medical History:  Diagnosis Date  . 174.4 03/2013   Left breast, T1c, N0, 12 mm; ER PR positive, HER-2/neu not over  expressed. Not a candidate for adjuvant chemotherapy [facility].  . Diabetes mellitus without complication ([place]   . Foot drop, right October 2014   noted post mastectomy, conservative treatment was instituted with resolution, mild edema.  . Hyperlipidemia   . Hypertension   . Sleep apnea    Past Surgical History:  Procedure Laterality Date  . BREAST BIOPSY Left 02/25/13   positive  . BREAST SURGERY Left 03-15-13   left mastectomywith SN biopsy  . COLON SURGERY  1991   Likely segmental resection for diverticulitis based on patient description.  . COLONOSCOPY    . EYE SURGERY Bilateral 04/2012  . intestinal balloon removed  1991   Likely surgery for diverticulitis.  .Marland KitchenMASTECTOMY Left 2014   positive  . 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   Family History  Problem Relation Age of Onset  . Cancer Brother        colon  . Diabetes Brother   . Heart disease Brother   . Diabetes Brother   . Lung cancer Son   . Breast cancer Neg Hx    Social History   Socioeconomic History  . Marital status: Widowed    Spouse name: Not on file  . Number of children: 5  . Years of education: Not on file  . Highest education level: 12th grade  Occupational History  . Occupation:  retired  Scientific laboratory technician  . Financial resource strain: Not hard at all  . Food insecurity:    Worry: Never true    Inability: Never true  . Transportation needs:    Medical: No    Non-medical: No  Tobacco Use  . Smoking status: Never Smoker  . Smokeless tobacco: Never Used  Substance and Sexual Activity  . Alcohol use: No  . Drug use: No  . Sexual activity: Never  Lifestyle  . Physical activity:    Days per week: Not on file    Minutes per session: Not on file  . Stress: Not at all  Relationships  . Social connections:    Talks on phone: Not on file    Gets together: Not on file    Attends religious service: Not on file    Active member  of club or organization: Not on file    Attends meetings of clubs or organizations: Not on file    Relationship status: Not on file  Other Topics Concern  . Not on file  Social History Narrative  . Not on file    Outpatient Encounter Medications as of 09/30/2017  Medication Sig  . alendronate (FOSAMAX) 70 MG tablet Take 1 tablet (70 mg total) by mouth every 7 (seven) days. Take with a full glass of water on an empty stomach.  Marland Kitchen amLODipine (NORVASC) 5 MG tablet   . aspirin 81 MG tablet Take 1 tablet (81 mg total) by mouth daily.  Marland Kitchen atorvastatin (LIPITOR) 80 MG tablet Take 1 tablet (80 mg total) by mouth daily.  . ferrous sulfate ([employer] IRON) 325 (65 FE) MG tablet Take 1 tablet (325 mg total) by mouth 2 (two) times daily.  . furosemide (LASIX) 20 MG tablet TAKE 1 TABLET DAILY  . HYDROcodone-acetaminophen (NORCO/VICODIN) 5-325 MG tablet Take 1-2 tablets by mouth every 4 (four) hours as needed for moderate pain.  Marland Kitchen letrozole (FEMARA) 2.5 MG tablet TAKE 1 TABLET DAILY  . lisinopril (PRINIVIL,ZESTRIL) 20 MG tablet   . meloxicam (MOBIC) 15 MG tablet TAKE 1 TABLET DAILY  . metFORMIN (GLUCOPHAGE) 500 MG tablet Take 1 tablet (500 mg total) by mouth 2 (two) times daily.  . Multiple Vitamins-Minerals (CENTRUM SILVER PO) Take 1 tablet by mouth daily.  Marland Kitchen omeprazole (PRILOSEC) 20 MG capsule TAKE 1 CAPSULE DAILY  . polyethylene glycol powder (GLYCOLAX/MIRALAX) powder Take by mouth daily.   . traZODone (DESYREL) 50 MG tablet TAKE 1 TABLET EVERY EVENING   No facility-administered encounter medications on file as of 09/30/2017.     Activities of Daily Living In your present state of health, do you have any difficulty performing the following activities: 09/30/2017  Hearing? N  Vision? N  Difficulty concentrating or making decisions? N  Walking or climbing stairs? Y  Comment Due to SOB.  Dressing or bathing? N  Doing errands, shopping? N  Preparing Food and eating ? N  Using the Toilet? N  In the  past six months, have you accidently leaked urine? N  Do you have problems with loss of bowel control? N  Managing your Medications? N  Managing your Finances? N  Housekeeping or managing your Housekeeping? N  Some recent data might be hidden    Patient Care Team: Rubye Beach as PCP - General (Family Medicine) Bary Castilla, Forest Gleason, MD (General Surgery) Idelle Leech, OD as Consulting Physician (Optometry)    Assessment:   This is a routine wellness examination for [place].  Exercise Activities  and Dietary recommendations Current Exercise Habits: The patient does not participate in regular exercise at present, Exercise limited by: None identified  Goals    None      Fall Risk Fall Risk  09/30/2017 09/26/2016 02/17/2015  Falls in the past year? No No No   Is the patient's home free of loose throw rugs in walkways, pet beds, electrical cords, etc?   yes      Grab bars in the bathroom? no      Handrails on the stairs?   no      Adequate lighting?   yes  Timed Get Up and Go performed: N/A  Depression Screen PHQ 2/9 Scores 09/30/2017 09/26/2016 09/26/2016 02/17/2015  PHQ - 2 Score 0 0 0 0  PHQ- 9 Score - 4 - -     Cognitive Function: Pt declined screening today.      6CIT Screen 09/26/2016  What Year? 0 points  What month? 0 points  What time? 0 points  Count back from 20 0 points  Months in reverse 0 points  Repeat phrase 4 points  Total Score 4    Immunization History  Administered Date(s) Administered  . Influenza Split 03/14/2010, 02/28/2011, 02/27/2012, 01/27/2013  . Influenza, High Dose Seasonal PF 02/17/2015, 03/06/2016, 03/28/2017  . Pneumococcal Conjugate-13 03/07/2014  . Pneumococcal Polysaccharide-23 06/19/2015  . Tdap 02/28/2011  . Zoster 05/11/2012    Qualifies for Shingles Vaccine? Due for Shingles vaccine. Declined my offer to administer today. Education has been provided regarding the importance of this vaccine. Pt has been advised to  call her insurance company to determine her out of pocket expense. Advised she may also receive this vaccine at her local pharmacy or Health Dept. Verbalized acceptance and understanding.  Screening Tests Health Maintenance  Topic Date Due  . FOOT EXAM  06/18/2017  . OPHTHALMOLOGY EXAM  07/09/2017  . HEMOGLOBIN A1C  09/26/2017  . INFLUENZA VACCINE  01/01/2018  . TETANUS/TDAP  02/27/2021  . DEXA SCAN  Completed  . PNA vac Low Risk Adult  Completed    Cancer Screenings: Lung: Low Dose CT Chest recommended if Age 50-80 years, 30 pack-year currently smoking OR have quit w/in 15years. Patient does not qualify. Breast:  Up to date on Mammogram? Yes   Up to date of Bone Density/Dexa? Yes Colorectal: Up to date  Additional Screenings:  Hepatitis C Screening: N/A     Plan:  I have personally reviewed and addressed the Medicare Annual Wellness questionnaire and have noted the following in the patient's chart:  A. Medical and social history B. Use of alcohol, tobacco or illicit drugs  C. Current medications and supplements D. Functional ability and status E.  Nutritional status F.  Physical activity G. Advance directives H. List of other physicians I.  Hospitalizations, surgeries, and ER visits in previous 12 months J.  [place] such as hearing and vision if needed, cognitive and depression L. Referrals and appointments - none  In addition, I have reviewed and discussed with patient certain preventive protocols, quality metrics, and best practice recommendations. A written personalized care plan for preventive services as well as general preventive health recommendations were provided to patient.  See attached scanned questionnaire for additional information.   Signed,  Fabio Neighbors, LPN Nurse Health Advisor   Nurse Recommendations: Pt needs a diabetic foot exam and her Hgb A1c checked today. Eye exam is scheduled for 10/16/17.

## 2017-09-30 NOTE — Patient Instructions (Addendum)
Robin Ashley , Thank you for taking time to come for your Medicare Wellness Visit. I appreciate your ongoing commitment to your health goals. Please review the following plan we discussed and let me know if I can assist you in the future.   Screening recommendations/referrals: Colonoscopy: Up to date Mammogram: Up to date Bone Density: Up to date Recommended yearly ophthalmology/optometry visit for glaucoma screening and checkup Recommended yearly dental visit for hygiene and checkup  Vaccinations: Influenza vaccine: Up to date Pneumococcal vaccine: Up to date Tdap vaccine: Up to date Shingles vaccine: Pt declines today.     Advanced directives: Please bring a copy of your POA (Power of Attorney) and/or Living Will to your next appointment.   Conditions/risks identified: Obesity- recommend cutting back on carbohydrate intake and monitoring intake.  Next appointment: 10:00 AM today with [name].    Preventive Care 15 Years and Older, Female Preventive care refers to lifestyle choices and visits with your health care provider that can promote health and wellness. What does preventive care include?  A yearly physical exam. This is also called an annual well check.  Dental exams once or twice a year.  Routine eye exams. Ask your health care provider how often you should have your eyes checked.  Personal lifestyle choices, including:  Daily care of your teeth and gums.  Regular physical activity.  Eating a healthy diet.  Avoiding tobacco and drug use.  Limiting alcohol use.  Practicing safe sex.  Taking low-dose aspirin every day.  Taking vitamin and mineral supplements as recommended by your health care provider. What happens during an annual well check? The services and screenings done by your health care provider during your annual well check will depend on your age, overall health, lifestyle risk factors, and family history of disease. Counseling  Your  health care provider may ask you questions about your:  Alcohol use.  Tobacco use.  Drug use.  Emotional well-being.  Home and relationship well-being.  Sexual activity.  Eating habits.  History of falls.  Memory and ability to understand (cognition).  Work and work Statistician.  Reproductive health. Screening  You may have the following tests or measurements:  Height, weight, and BMI.  Blood pressure.  Lipid and cholesterol levels. These may be checked every 5 years, or more frequently if you are over 51 years old.  Skin check.  Lung cancer screening. You may have this screening every year starting at age 72 if you have a 30-pack-year history of smoking and currently smoke or have quit within the past 15 years.  Fecal occult blood test (FOBT) of the stool. You may have this test every year starting at age 25.  Flexible sigmoidoscopy or colonoscopy. You may have a sigmoidoscopy every 5 years or a colonoscopy every 10 years starting at age 11.  Hepatitis C blood test.  Hepatitis B blood test.  Sexually transmitted disease (STD) testing.  Diabetes screening. This is done by checking your blood sugar (glucose) after you have not eaten for a while (fasting). You may have this done every 1-3 years.  Bone density scan. This is done to screen for osteoporosis. You may have this done starting at age 83.  Mammogram. This may be done every 1-2 years. Talk to your health care provider about how often you should have regular mammograms. Talk with your health care provider about your test results, treatment options, and if necessary, the need for more tests. Vaccines  Your health care provider may recommend  certain vaccines, such as:  Influenza vaccine. This is recommended every year.  Tetanus, diphtheria, and acellular pertussis (Tdap, Td) vaccine. You may need a Td booster every 10 years.  Zoster vaccine. You may need this after age 9.  Pneumococcal 13-valent  conjugate (PCV13) vaccine. One dose is recommended after age 81.  Pneumococcal polysaccharide (PPSV23) vaccine. One dose is recommended after age 45. Talk to your health care provider about which screenings and vaccines you need and how often you need them. This information is not intended to replace advice given to you by your health care provider. Make sure you discuss any questions you have with your health care provider. Document Released: 06/16/2015 Document Revised: 02/07/2016 Document Reviewed: 03/21/2015 Elsevier Interactive Patient Education  2017 [place] Prevention in the Home Falls can cause injuries. They can happen to people of all ages. There are many things you can do to make your home safe and to help prevent falls. What can I do on the outside of my home?  Regularly fix the edges of walkways and driveways and fix any cracks.  Remove anything that might make you trip as you walk through a door, such as a raised step or threshold.  Trim any bushes or trees on the path to your home.  Use bright outdoor lighting.  Clear any walking paths of anything that might make someone trip, such as rocks or tools.  Regularly check to see if handrails are loose or broken. Make sure that both sides of any steps have handrails.  Any raised decks and porches should have guardrails on the edges.  Have any leaves, snow, or ice cleared regularly.  Use sand or salt on walking paths during winter.  Clean up any spills in your garage right away. This includes oil or grease spills. What can I do in the bathroom?  Use night lights.  Install grab bars by the toilet and in the tub and shower. Do not use towel bars as grab bars.  Use non-skid mats or decals in the tub or shower.  If you need to sit down in the shower, use a plastic, non-slip stool.  Keep the floor dry. Clean up any water that spills on the floor as soon as it happens.  Remove soap buildup in the tub or  shower regularly.  Attach bath mats securely with double-sided non-slip rug tape.  Do not have throw rugs and other things on the floor that can make you trip. What can I do in the bedroom?  Use night lights.  Make sure that you have a light by your bed that is easy to reach.  Do not use any sheets or blankets that are too big for your bed. They should not hang down onto the floor.  Have a firm chair that has side arms. You can use this for support while you get dressed.  Do not have throw rugs and other things on the floor that can make you trip. What can I do in the kitchen?  Clean up any spills right away.  Avoid walking on wet floors.  Keep items that you use a lot in easy-to-reach places.  If you need to reach something above you, use a strong step stool that has a grab bar.  Keep electrical cords out of the way.  Do not use floor polish or wax that makes floors slippery. If you must use wax, use non-skid floor wax.  Do not have throw rugs and other  things on the floor that can make you trip. What can I do with my stairs?  Do not leave any items on the stairs.  Make sure that there are handrails on both sides of the stairs and use them. Fix handrails that are broken or loose. Make sure that handrails are as long as the stairways.  Check any carpeting to make sure that it is firmly attached to the stairs. Fix any carpet that is loose or worn.  Avoid having throw rugs at the top or bottom of the stairs. If you do have throw rugs, attach them to the floor with carpet tape.  Make sure that you have a light switch at the top of the stairs and the bottom of the stairs. If you do not have them, ask someone to add them for you. What else can I do to help prevent falls?  Wear shoes that:  Do not have high heels.  Have rubber bottoms.  Are comfortable and fit you well.  Are closed at the toe. Do not wear sandals.  If you use a stepladder:  Make sure that it is fully  opened. Do not climb a closed stepladder.  Make sure that both sides of the stepladder are locked into place.  Ask someone to hold it for you, if possible.  Clearly mark and make sure that you can see:  Any grab bars or handrails.  First and last steps.  Where the edge of each step is.  Use tools that help you move around (mobility aids) if they are needed. These include:  Canes.  Walkers.  Scooters.  Crutches.  Turn on the lights when you go into a dark area. Replace any light bulbs as soon as they burn out.  Set up your furniture so you have a clear path. Avoid moving your furniture around.  If any of your floors are uneven, fix them.  If there are any pets around you, be aware of where they are.  Review your medicines with your doctor. Some medicines can make you feel dizzy. This can increase your chance of falling. Ask your doctor what other things that you can do to help prevent falls. This information is not intended to replace advice given to you by your health care provider. Make sure you discuss any questions you have with your health care provider. Document Released: 03/16/2009 Document Revised: 10/26/2015 Document Reviewed: 06/24/2014 Elsevier Interactive Patient Education  2017 [employer].

## 2017-09-30 NOTE — Progress Notes (Signed)
Patient: Robin Ashley, Female    DOB: [birth year], 84 y.o.   MRN: [id number] Visit Date: 09/30/2017  Today's Provider: Mar Daring, PA-C   Chief Complaint  Patient presents with  . Diabetes  . Hypertension  . Hyperlipidemia   Subjective:    Diabetes Mellitus Type II, Follow-up:   Lab Results  Component Value Date   HGBA1C 6.0 03/28/2017   HGBA1C 6.6 (H) 09/26/2016   HGBA1C 6.8 06/18/2016   Last seen for diabetes 6 months ago.  Management since then includes no changes. She reports excellent compliance with treatment. She is not having side effects.  Current symptoms include none and have been stable. Home blood sugar records: fasting range: not being checked  Episodes of hypoglycemia? no   Current Insulin Regimen: none  Most Recent Eye Exam: 05/19 Dr. Matilde Sprang Weight trend: stable Prior visit with dietician: no Current diet: in general, a "healthy" diet   Current exercise: no regular exercise  ------------------------------------------------------------------------   Hypertension, follow-up:  BP Readings from Last 3 Encounters:  09/30/17 (!) 156/64  09/30/17 (!) 156/64  05/01/17 128/68    She was last seen for hypertension 1 years ago.  BP at that visit was 156/76. Management since that visit includes no chagnes.She reports excellent compliance with treatment. She is not having side effects.  She is not exercising. She is adherent to low salt diet.   Outside blood pressures are not being checked. She is experiencing none.  Patient denies chest pain and irregular heart beat.   Cardiovascular risk factors include advanced age (older than 17 for men, 54 for women), diabetes mellitus and hypertension.  Use of agents associated with hypertension: none.   ------------------------------------------------------------------------    Lipid/Cholesterol, Follow-up:   Last seen for this 1 years ago.  Management since that visit includes no  changes.  Last Lipid Panel:    Component Value Date/Time   CHOL 158 03/06/2016 0925   TRIG 170 (H) 03/06/2016 0925   HDL 47 03/06/2016 0925   CHOLHDL 3.4 03/06/2016 0925   LDLCALC 77 03/06/2016 0925    She reports excellent compliance with treatment. She is not having side effects.   Wt Readings from Last 3 Encounters:  09/30/17 213 lb (96.6 kg)  09/30/17 213 lb 9.6 oz (96.9 kg)  05/01/17 209 lb (94.8 kg)   ------------------------------------------------------------------------   Review of Systems  Constitutional: Negative.   HENT: Positive for tinnitus.   Eyes: Negative.   Respiratory: Positive for apnea.   Cardiovascular: Negative.   Gastrointestinal: Negative.   Endocrine: Negative.   Genitourinary: Negative.   Musculoskeletal: Positive for arthralgias.  Skin: Negative.   Allergic/Immunologic: Negative.   Neurological: Negative.   Hematological: Negative.   Psychiatric/Behavioral: Negative.     Social History      She  reports that she has never smoked. She has never used smokeless tobacco. She reports that she does not drink alcohol or use drugs.       Social History   Socioeconomic History  . Marital status: Widowed    Spouse name: Not on file  . Number of children: 5  . Years of education: Not on file  . Highest education level: 12th grade  Occupational History  . Occupation: retired  Scientific laboratory technician  . Financial resource strain: Not hard at all  . Food insecurity:    Worry: Never true    Inability: Never true  . Transportation needs:    Medical: No  Non-medical: No  Tobacco Use  . Smoking status: Never Smoker  . Smokeless tobacco: Never Used  Substance and Sexual Activity  . Alcohol use: No  . Drug use: No  . Sexual activity: Never  Lifestyle  . Physical activity:    Days per week: Not on file    Minutes per session: Not on file  . Stress: Not at all  Relationships  . Social connections:    Talks on phone: Not on file    Gets  together: Not on file    Attends religious service: Not on file    Active member of club or organization: Not on file    Attends meetings of clubs or organizations: Not on file    Relationship status: Not on file  Other Topics Concern  . Not on file  Social History Narrative  . Not on file    Past Medical History:  Diagnosis Date  . 174.4 03/2013   Left breast, T1c, N0, 12 mm; ER PR positive, HER-2/neu not over expressed. Not a candidate for adjuvant chemotherapy per [facility] tumor board.  . Diabetes mellitus without complication ([place])   . Foot drop, right October 2014   noted post mastectomy, conservative treatment was instituted with resolution, mild edema.  . Hyperlipidemia   . Hypertension   . Sleep apnea      Patient Active Problem List   Diagnosis Date Noted  . Osteoporosis 05/01/2017  . Postmenopausal bone loss 03/19/2017  . Pubic ramus fracture ([place]) 09/27/2016  . Lymphedema 08/21/2016  . Osteoarthritis of left shoulder 12/22/2015  . Paresthesia and pain of right extremity 06/19/2015  . Diverticulosis 02/17/2015  . Adaptation reaction 02/16/2015  . At risk for falling 02/16/2015  . Benign hypertension with CKD (chronic kidney disease) stage III ([place]) 02/16/2015  . Adult BMI 30+ 02/16/2015  . Chronic kidney disease (CKD), stage III (moderate) ([place]) 02/16/2015  . CN (constipation) 02/16/2015  . Diabetes ([place]) 02/16/2015  . Accumulation of fluid in tissues 02/16/2015  . H/O gastric ulcer 02/16/2015  . Hypercholesteremia 02/16/2015  . Insomnia 02/16/2015  . Gonalgia 02/16/2015  . Cramps of lower extremity 02/16/2015  . Fungal infection of toenail 02/16/2015  . Pain in shoulder 02/16/2015  . Arthritis 11/22/2014  . Helicobacter pylori gastrointestinal tract infection 05/18/2014  . Anemia, iron deficiency 03/27/2014  . Bilateral lower extremity edema 02/22/2014  . Breast cancer ([place]) 03/04/2013    Past Surgical History:  Procedure Laterality Date  . BREAST  BIOPSY Left 02/25/13   positive  . BREAST SURGERY Left 03-15-13   left mastectomywith SN biopsy  . COLON SURGERY  1991   Likely segmental resection for diverticulitis based on patient description.  . COLONOSCOPY    . EYE SURGERY Bilateral 04/2012  . intestinal balloon removed  1991   Likely surgery for diverticulitis.  Marland Kitchen MASTECTOMY Left 2014   positive  . 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    Family History        Family Status  Relation Name Status  . Brother  Alive  . Mother  Deceased at age 32       cva  . Father  Deceased at age 68       staph infection  . Brother  Alive  . Brother  Deceased       heart disease  . Son  Deceased  . Neg Hx  (Not Specified)  Her family history includes Cancer in her brother; Diabetes in her brother and brother; Heart disease in her brother; Lung cancer in her son. There is no history of Breast cancer.      No Known Allergies   Current Outpatient Medications:  .  alendronate (FOSAMAX) 70 MG tablet, Take 1 tablet (70 mg total) by mouth every 7 (seven) days. Take with a full glass of water on an empty stomach., Disp: 12 tablet, Rfl: 3 .  amLODipine (NORVASC) 5 MG tablet, , Disp: , Rfl:  .  aspirin 81 MG tablet, Take 1 tablet (81 mg total) by mouth daily., Disp: 30 tablet, Rfl: 0 .  atorvastatin (LIPITOR) 80 MG tablet, Take 1 tablet (80 mg total) by mouth daily., Disp: 90 tablet, Rfl: 3 .  ferrous sulfate ([employer] IRON) 325 (65 FE) MG tablet, Take 1 tablet (325 mg total) by mouth 2 (two) times daily., Disp: 180 tablet, Rfl: 1 .  furosemide (LASIX) 20 MG tablet, TAKE 1 TABLET DAILY, Disp: 90 tablet, Rfl: 1 .  HYDROcodone-acetaminophen (NORCO/VICODIN) 5-325 MG tablet, Take 1-2 tablets by mouth every 4 (four) hours as needed for moderate pain., Disp: 30 tablet, Rfl: 0 .  letrozole (FEMARA) 2.5 MG tablet, TAKE 1 TABLET DAILY, Disp: 90 tablet, Rfl: 1 .  lisinopril (PRINIVIL,ZESTRIL) 20 MG  tablet, , Disp: , Rfl:  .  meloxicam (MOBIC) 15 MG tablet, TAKE 1 TABLET DAILY, Disp: 90 tablet, Rfl: 1 .  metFORMIN (GLUCOPHAGE) 500 MG tablet, Take 1 tablet (500 mg total) by mouth 2 (two) times daily., Disp: 180 tablet, Rfl: 3 .  Multiple Vitamins-Minerals (CENTRUM SILVER PO), Take 1 tablet by mouth daily., Disp: , Rfl:  .  omeprazole (PRILOSEC) 20 MG capsule, TAKE 1 CAPSULE DAILY, Disp: 90 capsule, Rfl: 4 .  polyethylene glycol powder (GLYCOLAX/MIRALAX) powder, Take by mouth daily. , Disp: , Rfl: 0 .  traZODone (DESYREL) 50 MG tablet, TAKE 1 TABLET EVERY EVENING, Disp: 90 tablet, Rfl: 3   Patient Care Team: Mar Daring, PA-C as PCP - General (Family Medicine) Bary Castilla, Forest Gleason, MD (General Surgery) Idelle Leech, OD as Consulting Physician (Optometry)      Objective:   Vitals:   BP 156/64 (BP Location: Left Arm)    Pulse 88    Temp 98.6 F (37 C) (Oral)    Ht 5' 7"  (1.702 m)    Wt 213 lb 9.6 oz (96.9 kg)    BMI 33.45 kg/m    BSA 2.14 m       Physical Exam  Constitutional: She is oriented to person, place, and time. She appears well-developed and well-nourished. No distress.  HENT:  Head: Normocephalic and atraumatic.  Right Ear: External ear normal.  Left Ear: External ear normal.  Nose: Nose normal.  Mouth/Throat: Uvula is midline, oropharynx is clear and moist and mucous membranes are normal. No oropharyngeal exudate.  Eyes: Pupils are equal, round, and reactive to light. Conjunctivae and EOM are normal. Right eye exhibits no discharge. Left eye exhibits no discharge. No scleral icterus.  Neck: Normal range of motion. Neck supple. No JVD present. Carotid bruit is not present. No tracheal deviation present. No thyromegaly present.  Cardiovascular: Normal rate, regular rhythm, normal heart sounds and intact distal pulses. Exam reveals no gallop and no friction rub.  No murmur heard. Pulmonary/Chest: Effort normal and breath sounds normal. No respiratory  distress. She has no wheezes. She has no rales. She exhibits no tenderness.  Abdominal: Soft. Bowel sounds are normal.  She exhibits no distension and no mass. There is no tenderness. There is no rebound and no guarding.  Musculoskeletal: Normal range of motion. She exhibits no edema or tenderness.  Lymphadenopathy:    She has no cervical adenopathy.  Neurological: She is alert and oriented to person, place, and time.  Skin: Skin is warm and dry. No rash noted. She is not diaphoretic.  Psychiatric: She has a normal mood and affect. Her behavior is normal. Judgment and thought content normal.  Vitals reviewed.    Depression Screen PHQ 2/9 Scores 09/30/2017 09/26/2016 09/26/2016 02/17/2015  PHQ - 2 Score 0 0 0 0  PHQ- 9 Score - 4 - -      Assessment & Plan:     Routine Health Maintenance and Physical Exam  Exercise Activities and Dietary recommendations Goals    None      Immunization History  Administered Date(s) Administered  . Influenza Split 03/14/2010, 02/28/2011, 02/27/2012, 01/27/2013  . Influenza, High Dose Seasonal PF 02/17/2015, 03/06/2016, 03/28/2017  . Pneumococcal Conjugate-13 03/07/2014  . Pneumococcal Polysaccharide-23 06/19/2015  . Tdap 02/28/2011  . Zoster 05/11/2012    Health Maintenance  Topic Date Due  . FOOT EXAM  06/18/2017  . OPHTHALMOLOGY EXAM  07/09/2017  . HEMOGLOBIN A1C  09/26/2017  . INFLUENZA VACCINE  01/01/2018  . TETANUS/TDAP  02/27/2021  . DEXA SCAN  Completed  . PNA vac Low Risk Adult  Completed     Discussed health benefits of physical activity, and encouraged her to engage in regular exercise appropriate for her age and condition.   1. Type 2 diabetes mellitus without complication, without long-term current use of insulin (HCC) Stable. Continue metformin 599m BID. Will check labs as below and f/u pending results. I will see her back in 6 months for f/u.  - Hemoglobin A1c  2. Essential hypertension Slightly elevated today but  patient did not take morning medications. Continue amlodipine 551mand lisinopril 201mAlso uses furosemide 81m1mn for leg swelling.  - CBC with Differential/Platelet - Comprehensive metabolic panel - TSH  3. Hypercholesteremia Stable on atorvastatin 80mg8mll check labs as below and f/u pending results. - Lipid Panel With LDL/HDL Ratio - TSH   --------------------------------------------------------------------    JenniMar DaringC  [facility]

## 2017-10-01 ENCOUNTER — Telehealth: Payer: Self-pay

## 2017-10-01 LAB — CBC WITH DIFFERENTIAL/PLATELET
BASOS ABS: 0.1 10*3/uL (ref 0.0–0.2)
Basos: 1 %
EOS (ABSOLUTE): 0.2 10*3/uL (ref 0.0–0.4)
Eos: 3 %
Hematocrit: 34.8 % (ref 34.0–46.6)
Hemoglobin: 11.4 g/dL (ref 11.1–15.9)
Immature Grans (Abs): 0.1 10*3/uL (ref 0.0–0.1)
Immature Granulocytes: 1 %
LYMPHS ABS: 2.3 10*3/uL (ref 0.7–3.1)
Lymphs: 24 %
MCH: 30 pg (ref 26.6–33.0)
MCHC: 32.8 g/dL (ref 31.5–35.7)
MCV: 92 fL (ref 79–97)
MONOS ABS: 0.7 10*3/uL (ref 0.1–0.9)
Monocytes: 8 %
Neutrophils Absolute: 6.1 10*3/uL (ref 1.4–7.0)
Neutrophils: 63 %
PLATELETS: 325 10*3/uL (ref 150–379)
RBC: 3.8 x10E6/uL (ref 3.77–5.28)
RDW: 14.4 % (ref 12.3–15.4)
WBC: 9.5 10*3/uL (ref 3.4–10.8)

## 2017-10-01 LAB — LIPID PANEL WITH LDL/HDL RATIO
Cholesterol, Total: 176 mg/dL (ref 100–199)
HDL: 43 mg/dL (ref >39)
LDL Calculated: 91 mg/dL (ref 0–99)
LDl/HDL Ratio: 2.1 ratio (ref 0.0–3.2)
Triglycerides: 211 mg/dL — ABNORMAL HIGH (ref 0–149)
VLDL Cholesterol Cal: 42 mg/dL — ABNORMAL HIGH (ref 5–40)

## 2017-10-01 LAB — COMPREHENSIVE METABOLIC PANEL
ALT: 12 IU/L (ref 0–32)
AST: 16 IU/L (ref 0–40)
Albumin/Globulin Ratio: 1.6 (ref 1.2–2.2)
Albumin: 4.1 g/dL (ref 3.5–4.7)
Alkaline Phosphatase: 138 IU/L — ABNORMAL HIGH (ref 39–117)
BUN/Creatinine Ratio: 10 — ABNORMAL LOW (ref 12–28)
BUN: 11 mg/dL (ref 8–27)
Bilirubin Total: 0.3 mg/dL (ref 0.0–1.2)
CO2: 23 mmol/L (ref 20–29)
CREATININE: 1.12 mg/dL — AB (ref 0.57–1.00)
Calcium: 8.5 mg/dL — ABNORMAL LOW (ref 8.7–10.3)
Chloride: 102 mmol/L (ref 96–106)
GFR calc Af Amer: 52 mL/min/{1.73_m2} — ABNORMAL LOW (ref >59)
GFR calc non Af Amer: 46 mL/min/{1.73_m2} — ABNORMAL LOW (ref >59)
GLOBULIN, TOTAL: 2.5 g/dL (ref 1.5–4.5)
GLUCOSE: 123 mg/dL — AB (ref 65–99)
Potassium: 4.1 mmol/L (ref 3.5–5.2)
SODIUM: 143 mmol/L (ref 134–144)
Total Protein: 6.6 g/dL (ref 6.0–8.5)

## 2017-10-01 LAB — TSH: TSH: 1.76 u[IU]/mL (ref 0.450–4.500)

## 2017-10-01 LAB — HEMOGLOBIN A1C
Est. average glucose Bld gHb Est-mCnc: 140 mg/dL
Hgb A1c MFr Bld: 6.5 % — ABNORMAL HIGH (ref 4.8–5.6)

## 2017-10-01 NOTE — Telephone Encounter (Signed)
-----   Message from Mar Daring, PA-C sent at 10/01/2017  8:24 AM EDT ----- A1c back up to 6.5 from 6.0. Continue all meds as prescribed and continue healthy lifestyle modifications with dieting. All other labs are all stable.

## 2017-10-01 NOTE — Telephone Encounter (Signed)
Patient advised as below. Patient verbalizes understanding and is in agreement with treatment plan.  

## 2017-10-16 DIAGNOSIS — H524 Presbyopia: Secondary | ICD-10-CM | POA: Diagnosis not present

## 2017-10-16 DIAGNOSIS — H5203 Hypermetropia, bilateral: Secondary | ICD-10-CM | POA: Diagnosis not present

## 2017-10-16 DIAGNOSIS — H52223 Regular astigmatism, bilateral: Secondary | ICD-10-CM | POA: Diagnosis not present

## 2017-10-16 DIAGNOSIS — H43813 Vitreous degeneration, bilateral: Secondary | ICD-10-CM | POA: Diagnosis not present

## 2017-10-16 DIAGNOSIS — E119 Type 2 diabetes mellitus without complications: Secondary | ICD-10-CM | POA: Diagnosis not present

## 2017-10-16 DIAGNOSIS — Z961 Presence of intraocular lens: Secondary | ICD-10-CM | POA: Diagnosis not present

## 2017-10-16 DIAGNOSIS — I10 Essential (primary) hypertension: Secondary | ICD-10-CM | POA: Diagnosis not present

## 2017-10-16 LAB — HM DIABETES EYE EXAM

## 2017-10-25 ENCOUNTER — Other Ambulatory Visit: Payer: Self-pay | Admitting: Physician Assistant

## 2017-10-25 DIAGNOSIS — C50912 Malignant neoplasm of unspecified site of left female breast: Secondary | ICD-10-CM

## 2017-11-14 ENCOUNTER — Encounter: Payer: Self-pay | Admitting: Physician Assistant

## 2017-11-20 ENCOUNTER — Other Ambulatory Visit: Payer: Self-pay | Admitting: Physician Assistant

## 2017-11-20 DIAGNOSIS — E1142 Type 2 diabetes mellitus with diabetic polyneuropathy: Secondary | ICD-10-CM

## 2017-11-20 DIAGNOSIS — E7800 Pure hypercholesterolemia, unspecified: Secondary | ICD-10-CM

## 2018-01-15 ENCOUNTER — Other Ambulatory Visit: Payer: Self-pay | Admitting: Physician Assistant

## 2018-01-15 DIAGNOSIS — G4700 Insomnia, unspecified: Secondary | ICD-10-CM

## 2018-02-09 ENCOUNTER — Other Ambulatory Visit: Payer: Self-pay

## 2018-02-09 DIAGNOSIS — Z1231 Encounter for screening mammogram for malignant neoplasm of breast: Secondary | ICD-10-CM

## 2018-02-14 ENCOUNTER — Other Ambulatory Visit: Payer: Self-pay | Admitting: Physician Assistant

## 2018-02-14 DIAGNOSIS — R600 Localized edema: Secondary | ICD-10-CM

## 2018-03-10 DIAGNOSIS — E782 Mixed hyperlipidemia: Secondary | ICD-10-CM | POA: Diagnosis not present

## 2018-03-10 DIAGNOSIS — I10 Essential (primary) hypertension: Secondary | ICD-10-CM | POA: Diagnosis not present

## 2018-03-10 DIAGNOSIS — E119 Type 2 diabetes mellitus without complications: Secondary | ICD-10-CM | POA: Diagnosis not present

## 2018-03-10 DIAGNOSIS — G4733 Obstructive sleep apnea (adult) (pediatric): Secondary | ICD-10-CM | POA: Diagnosis not present

## 2018-03-12 ENCOUNTER — Ambulatory Visit
Admission: RE | Admit: 2018-03-12 | Discharge: 2018-03-12 | Disposition: A | Discharge status: Home / Self Care | Payer: Medicare Other | Source: Ambulatory Visit | Attending: General Surgery | Admitting: General Surgery

## 2018-03-12 DIAGNOSIS — Z1231 Encounter for screening mammogram for malignant neoplasm of breast: Secondary | ICD-10-CM | POA: Diagnosis not present

## 2018-04-02 ENCOUNTER — Encounter: Payer: Self-pay | Admitting: General Surgery

## 2018-04-02 ENCOUNTER — Ambulatory Visit (INDEPENDENT_AMBULATORY_CARE_PROVIDER_SITE_OTHER): Payer: Medicare Other | Admitting: General Surgery

## 2018-04-02 ENCOUNTER — Other Ambulatory Visit: Payer: Self-pay

## 2018-04-02 VITALS — BP 144/88 | HR 118 | Temp 97.7°F | Resp 18 | Ht 66.0 in | Wt 206.8 lb

## 2018-04-02 DIAGNOSIS — Z1231 Encounter for screening mammogram for malignant neoplasm of breast: Secondary | ICD-10-CM

## 2018-04-02 NOTE — Patient Instructions (Addendum)
Patient needs to return to the office in 1 year for mammogram.Patient has been advised to call the office with any questions or concerns. Discontinue taking the Femara once all pills have been completed.

## 2018-04-02 NOTE — Progress Notes (Signed)
Patient ID: Robin Ashley, female   DOB: [birth year], 84 y.o.   MRN: [id number]  Chief Complaint  Patient presents with  . Follow-up     one year f/u recall uni right screening mammo [facility] 03/12/18    HPI Robin Ashley is a 84 y.o. female here today to follow up for 1 year mammogram. Patient states she is feeling well.  HPI  Past Medical History:  Diagnosis Date  . 174.4 03/2013   Left breast, T1c, N0, 12 mm; ER PR positive, HER-2/neu not over expressed. Not a candidate for adjuvant chemotherapy per [facility] tumor board.  . Diabetes mellitus without complication ([place])   . Foot drop, right October 2014   noted post mastectomy, conservative treatment was instituted with resolution, mild edema.  . Hyperlipidemia   . Hypertension   . Sleep apnea     Past Surgical History:  Procedure Laterality Date  . BREAST BIOPSY Left 02/25/13   positive  . BREAST SURGERY Left 03-15-13   left mastectomywith SN biopsy  . COLON SURGERY  1991   Likely segmental resection for diverticulitis based on patient description.  . COLONOSCOPY    . EYE SURGERY Bilateral 04/2012  . intestinal balloon removed  1991   Likely surgery for diverticulitis.  Marland Kitchen MASTECTOMY Left 2014   positive  . 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    Family History  Problem Relation Age of Onset  . Cancer Brother        colon  . Diabetes Brother   . Heart disease Brother   . Diabetes Brother   . Lung cancer Son   . Breast cancer Neg Hx     Social History Social History   Tobacco Use  . Smoking status: Never Smoker  . Smokeless tobacco: Never Used  Substance Use Topics  . Alcohol use: No  . Drug use: No    No Known Allergies  Current Outpatient Medications  Medication Sig Dispense Refill  . alendronate (FOSAMAX) 70 MG tablet Take 1 tablet (70 mg total) by mouth every 7 (seven) days. Take with a full glass of water on an empty stomach. 12 tablet 3  .  amLODipine (NORVASC) 5 MG tablet     . amoxicillin-clavulanate (AUGMENTIN) 875-125 MG tablet amoxicillin 875 mg-potassium clavulanate 125 mg tablet  TAKE 1 TABLET BY MOUTH TWICE A DAY FOR 10 DAYS    . aspirin 81 MG tablet Take 1 tablet (81 mg total) by mouth daily. 30 tablet 0  . atorvastatin (LIPITOR) 80 MG tablet TAKE 1 TABLET DAILY 90 tablet 3  . ferrous sulfate 325 (65 FE) MG tablet Iron (ferrous sulfate) 325 mg (65 mg iron) tablet  TAKE 1 TABLET BY MOUTH TWICE A DAY    . furosemide (LASIX) 20 MG tablet TAKE 1 TABLET DAILY 90 tablet 4  . HYDROcodone-acetaminophen (NORCO/VICODIN) 5-325 MG tablet Take 1-2 tablets by mouth every 4 (four) hours as needed for moderate pain. 30 tablet 0  . letrozole (FEMARA) 2.5 MG tablet TAKE 1 TABLET DAILY 90 tablet 1  . lisinopril (PRINIVIL,ZESTRIL) 20 MG tablet     . meloxicam (MOBIC) 15 MG tablet TAKE 1 TABLET DAILY 90 tablet 1  . metFORMIN (GLUCOPHAGE) 500 MG tablet TAKE 1 TABLET TWICE A DAY 180 tablet 3  . methylPREDNISolone (MEDROL DOSEPAK) 4 MG TBPK tablet methylprednisolone 4 mg tablets in a dose pack  TAKE AS DIRECTED    . methylPREDNISolone (MEDROL DOSEPAK)  4 MG TBPK tablet methylprednisolone 4 mg tablets in a dose pack  TAKE AS DIRECTED    . Multiple Vitamins-Minerals (CENTRUM SILVER PO) Take 1 tablet by mouth daily.    Marland Kitchen omeprazole (PRILOSEC) 20 MG capsule TAKE 1 CAPSULE DAILY 90 capsule 4  . Omeprazole 20 MG TBEC omeprazole 20 mg capsule,delayed release    . oxyCODONE (OXY IR/ROXICODONE) 5 MG immediate release tablet oxycodone 5 mg tablet  TAKE 1 TABLET BY MOUTH EVERY 4 HOURS    . polyethylene glycol powder (GLYCOLAX/MIRALAX) powder Take by mouth daily.   0  . traZODone (DESYREL) 50 MG tablet TAKE 1 TABLET EVERY EVENING 90 tablet 3   No current facility-administered medications for this visit.     Review of Systems Review of Systems  Constitutional: Negative.   Respiratory: Negative.   Cardiovascular: Negative.     Blood pressure (!)  144/88, pulse (!) 118, temperature 97.7 F (36.5 C), temperature source Temporal, resp. rate 18, height 5' 6"  (1.676 m), weight 206 lb 12.8 oz (93.8 kg), SpO2 97 %.  Physical Exam Physical Exam  Constitutional: She is oriented to person, place, and time. She appears well-developed and well-nourished.  Eyes: Conjunctivae are normal. No scleral icterus.  Neck: Normal range of motion.  Cardiovascular: Normal rate, regular rhythm and normal heart sounds.  Pulmonary/Chest: Effort normal and breath sounds normal. Right breast exhibits no inverted nipple, no mass, no nipple discharge, no skin change and no tenderness. Left breast exhibits no inverted nipple, no mass, no nipple discharge, no skin change and no tenderness. Breasts are symmetrical.    Lymphadenopathy:    She has no cervical adenopathy.    She has no axillary adenopathy.       Right: No supraclavicular adenopathy present.       Left: No supraclavicular adenopathy present.  Neurological: She is alert and oriented to person, place, and time.  Skin: Skin is warm and dry.    Data Reviewed Right breast screening mammogram dated March 12, 2018 was reviewed.  BI-RADS-1.  Assessment    No evidence of recurrent breast cancer.  Osteoporosis on 2018 bone density.    Plan Patient needs to return to the office in 1 year for mammogram.   Patient has been advised to call the office with any questions or concerns. Discontinue taking the Femara once all pills have been completed.  In light of her age and osteoporosis, I would be reluctant to continue antiestrogen therapy beyond 5 years.  For that reason, will not order BCI testing.  A new prescription for breast prosthesis and surgical bras was provided.  The patient will be a candidate for a bone density exam in November 2020.  HPI, Physical Exam, Assessment and Plan have been scribed under the direction and in the presence of Hervey Ard, Md.  Eudelia Bunch R. Bobette Mo, CMA  I  have completed the exam and reviewed the above documentation for accuracy and completeness.  I agree with the above.  Haematologist has been used and any errors in dictation or transcription are unintentional.  Hervey Ard, M.D., F.A.C.S.  Forest Gleason Bethani Brugger 04/02/2018, 8:27 PM

## 2018-04-06 ENCOUNTER — Ambulatory Visit (INDEPENDENT_AMBULATORY_CARE_PROVIDER_SITE_OTHER): Payer: Medicare Other | Admitting: Physician Assistant

## 2018-04-06 ENCOUNTER — Encounter: Payer: Self-pay | Admitting: Physician Assistant

## 2018-04-06 VITALS — BP 142/80 | HR 75 | Temp 97.7°F | Resp 16 | Wt 204.0 lb

## 2018-04-06 DIAGNOSIS — I129 Hypertensive chronic kidney disease with stage 1 through stage 4 chronic kidney disease, or unspecified chronic kidney disease: Secondary | ICD-10-CM

## 2018-04-06 DIAGNOSIS — E119 Type 2 diabetes mellitus without complications: Secondary | ICD-10-CM

## 2018-04-06 DIAGNOSIS — N183 Chronic kidney disease, stage 3 (moderate): Secondary | ICD-10-CM | POA: Diagnosis not present

## 2018-04-06 DIAGNOSIS — Z23 Encounter for immunization: Secondary | ICD-10-CM | POA: Diagnosis not present

## 2018-04-06 LAB — POCT GLYCOSYLATED HEMOGLOBIN (HGB A1C)
ESTIMATED AVERAGE GLUCOSE: 134
HEMOGLOBIN A1C: 6.3 % — AB (ref 4.0–5.6)

## 2018-04-06 NOTE — Progress Notes (Signed)
Patient: Robin Ashley Female    DOB: 1931-06-12   84 y.o.   MRN: [id number] Visit Date: 04/06/2018  Today's Provider: Mar Daring, PA-C   Chief Complaint  Patient presents with  . Follow-up    DM,HTN   Subjective:    HPI Patient here for 6 months follow-up T2DM.  Diabetes Mellitus Type II, Follow-up:   Lab Results  Component Value Date   HGBA1C 6.5 (H) 09/30/2017   HGBA1C 6.0 03/28/2017   HGBA1C 6.6 (H) 09/26/2016    Last seen for diabetes 6 months ago.  Management since then includes Continue Metformin 500mg  BID.Marland Kitchen She reports excellent compliance with treatment. She is not having side effects.  Current symptoms include none and have been stable. Home blood sugar records: not checking  Episodes of hypoglycemia? no   Current Insulin Regimen: none Most Recent Eye Exam: 05/19 Weight trend: stable Current diet: in general, a "healthy" diet   Current exercise: no regular exercise  Pertinent Labs:    Component Value Date/Time   CHOL 176 09/30/2017 1039   TRIG 211 (H) 09/30/2017 1039   HDL 43 09/30/2017 1039   LDLCALC 91 09/30/2017 1039   CREATININE 1.12 (H) 09/30/2017 1039   CREATININE 1.13 03/09/2013 1247    Wt Readings from Last 3 Encounters:  04/06/18 204 lb (92.5 kg)  04/02/18 206 lb 12.8 oz (93.8 kg)  09/30/17 213 lb (96.6 kg)    ------------------------------------------------------------------------  Hypertension, follow-up:  BP Readings from Last 3 Encounters:  04/06/18 (!) 142/80  04/02/18 (!) 144/88  09/30/17 (!) 156/64    She was last seen for hypertension 6 months ago.  BP at that visit was 156/64. Management since that visit includes none. Continue Amlodipine 5 mg and Lisinopril 20 mg. She reports excellent compliance with treatment. She is not having side effects.  She is not exercising. She is adherent to low salt diet.   Outside blood pressures are not checking. She is experiencing none.  Patient denies chest  pain, chest pressure/discomfort, fatigue, irregular heart beat and palpitations.   Cardiovascular risk factors include advanced age (older than 57 for men, 20 for women), diabetes mellitus, dyslipidemia and hypertension.    ------------------------------------------------------------------------  Lipid/Cholesterol, Follow-up:   Last seen for this6 months ago.  Management changes since that visit include none Stable on Atorvastatin 80mg . . Last Lipid Panel:    Component Value Date/Time   CHOL 176 09/30/2017 1039   TRIG 211 (H) 09/30/2017 1039   HDL 43 09/30/2017 1039   CHOLHDL 3.4 03/06/2016 0925   LDLCALC 91 09/30/2017 1039    Risk factors for vascular disease include diabetes mellitus, hypercholesterolemia and hypertension  She reports excellent compliance with treatment. She is not having side effects.  Current symptoms include none and have been stable.  -------------------------------------------------------------------      No Known Allergies   Current Outpatient Medications:  .  alendronate (FOSAMAX) 70 MG tablet, Take 1 tablet (70 mg total) by mouth every 7 (seven) days. Take with a full glass of water on an empty stomach., Disp: 12 tablet, Rfl: 3 .  amLODipine (NORVASC) 5 MG tablet, , Disp: , Rfl:  .  amoxicillin-clavulanate (AUGMENTIN) 875-125 MG tablet, amoxicillin 875 mg-potassium clavulanate 125 mg tablet  TAKE 1 TABLET BY MOUTH TWICE A DAY FOR 10 DAYS, Disp: , Rfl:  .  aspirin 81 MG tablet, Take 1 tablet (81 mg total) by mouth daily., Disp: 30 tablet, Rfl: 0 .  atorvastatin (LIPITOR) 80  MG tablet, TAKE 1 TABLET DAILY, Disp: 90 tablet, Rfl: 3 .  ferrous sulfate 325 (65 FE) MG tablet, Iron (ferrous sulfate) 325 mg (65 mg iron) tablet  TAKE 1 TABLET BY MOUTH TWICE A DAY, Disp: , Rfl:  .  furosemide (LASIX) 20 MG tablet, TAKE 1 TABLET DAILY, Disp: 90 tablet, Rfl: 4 .  HYDROcodone-acetaminophen (NORCO/VICODIN) 5-325 MG tablet, Take 1-2 tablets by mouth every 4 (four)  hours as needed for moderate pain., Disp: 30 tablet, Rfl: 0 .  letrozole (FEMARA) 2.5 MG tablet, TAKE 1 TABLET DAILY, Disp: 90 tablet, Rfl: 1 .  lisinopril (PRINIVIL,ZESTRIL) 20 MG tablet, , Disp: , Rfl:  .  meloxicam (MOBIC) 15 MG tablet, TAKE 1 TABLET DAILY, Disp: 90 tablet, Rfl: 1 .  metFORMIN (GLUCOPHAGE) 500 MG tablet, TAKE 1 TABLET TWICE A DAY, Disp: 180 tablet, Rfl: 3 .  Multiple Vitamins-Minerals (CENTRUM SILVER PO), Take 1 tablet by mouth daily., Disp: , Rfl:  .  omeprazole (PRILOSEC) 20 MG capsule, TAKE 1 CAPSULE DAILY, Disp: 90 capsule, Rfl: 4 .  Omeprazole 20 MG TBEC, omeprazole 20 mg capsule,delayed release, Disp: , Rfl:  .  oxyCODONE (OXY IR/ROXICODONE) 5 MG immediate release tablet, oxycodone 5 mg tablet  TAKE 1 TABLET BY MOUTH EVERY 4 HOURS, Disp: , Rfl:  .  polyethylene glycol powder (GLYCOLAX/MIRALAX) powder, Take by mouth daily. , Disp: , Rfl: 0 .  traZODone (DESYREL) 50 MG tablet, TAKE 1 TABLET EVERY EVENING, Disp: 90 tablet, Rfl: 3 .  methylPREDNISolone (MEDROL DOSEPAK) 4 MG TBPK tablet, methylprednisolone 4 mg tablets in a dose pack  TAKE AS DIRECTED, Disp: , Rfl:  .  methylPREDNISolone (MEDROL DOSEPAK) 4 MG TBPK tablet, methylprednisolone 4 mg tablets in a dose pack  TAKE AS DIRECTED, Disp: , Rfl:   Review of Systems  Constitutional: Negative.   Respiratory: Negative.   Cardiovascular: Negative.   Neurological: Negative.     Social History   Tobacco Use  . Smoking status: Never Smoker  . Smokeless tobacco: Never Used  Substance Use Topics  . Alcohol use: No   Objective:   BP (!) 142/80 (BP Location: Right Arm, Patient Position: Sitting, Cuff Size: Normal)   Pulse 75   Temp 97.7 F (36.5 C) (Oral)   Resp 16   Wt 204 lb (92.5 kg)   SpO2 97%   BMI 32.93 kg/m  Vitals:   04/06/18 0755  BP: (!) 142/80  Pulse: 75  Resp: 16  Temp: 97.7 F (36.5 C)  TempSrc: Oral  SpO2: 97%  Weight: 204 lb (92.5 kg)     Physical Exam  Constitutional: She appears  well-developed and well-nourished. No distress.  Neck: Normal range of motion. Neck supple.  Cardiovascular: Normal rate, regular rhythm and normal heart sounds. Exam reveals no gallop and no friction rub.  No murmur heard. Pulmonary/Chest: Effort normal and breath sounds normal. No respiratory distress. She has no wheezes. She has no rales.  Skin: She is not diaphoretic.  Vitals reviewed.  Diabetic Foot Exam - Simple   Simple Foot Form Diabetic Foot exam was performed with the following findings:  Yes 04/06/2018  8:32 AM  Visual Inspection No deformities, no ulcerations, no other skin breakdown bilaterally:  Yes Sensation Testing Intact to touch and monofilament testing bilaterally:  Yes Pulse Check Posterior Tibialis and Dorsalis pulse intact bilaterally:  Yes Comments       Assessment & Plan:     1. Type 2 diabetes mellitus without complication, without long-term current use of  insulin (HCC) A1c stable at 6.3. Continue medications as prescribed. I will see her back in 3 months for recheck.  - POCT glycosylated hemoglobin (Hb A1C)  2. Benign hypertension with CKD (chronic kidney disease) stage III (HCC) Stable. Continue amlodipine, furosemide, and lisinopril.   3. Need for influenza vaccination Flu vaccine given today without complication. Patient sat upright for 15 minutes to check for adverse reaction before being released. - Flu vaccine HIGH DOSE PF       Mar Daring, PA-C  [facility]

## 2018-04-26 ENCOUNTER — Other Ambulatory Visit: Payer: Self-pay | Admitting: Physician Assistant

## 2018-04-26 DIAGNOSIS — C50912 Malignant neoplasm of unspecified site of left female breast: Secondary | ICD-10-CM

## 2018-05-25 ENCOUNTER — Other Ambulatory Visit: Payer: Self-pay | Admitting: Physician Assistant

## 2018-05-25 DIAGNOSIS — M810 Age-related osteoporosis without current pathological fracture: Secondary | ICD-10-CM

## 2018-07-07 NOTE — Progress Notes (Signed)
Patient: Robin Ashley Female    DOB: 1931/08/04   86 y.o.   MRN: [id number] Visit Date: 07/08/2018  Today's Provider: Mar Daring, PA-C   Chief Complaint  Patient presents with  . Follow-up    T2DM,HTN   Subjective:     HPI   Diabetes Mellitus Type II, Follow-up:   Lab Results  Component Value Date   HGBA1C 6.2 (A) 07/08/2018   HGBA1C 6.3 (A) 04/06/2018   HGBA1C 6.5 (H) 09/30/2017    Last seen for diabetes 3 months ago.  Management since then includes none. She reports excellent compliance with treatment. She is not having side effects.  Current symptoms include none and have been stable. Home blood sugar records: not checking  Current Insulin Regimen: none Most Recent Eye Exam: 10/2017 Weight trend: stable Current diet: in general, a "healthy" diet   Current exercise: none  Pertinent Labs:    Component Value Date/Time   CHOL 176 09/30/2017 1039   TRIG 211 (H) 09/30/2017 1039   HDL 43 09/30/2017 1039   LDLCALC 91 09/30/2017 1039   CREATININE 1.12 (H) 09/30/2017 1039   CREATININE 1.13 03/09/2013 1247    Wt Readings from Last 3 Encounters:  07/08/18 206 lb (93.4 kg)  04/06/18 204 lb (92.5 kg)  04/02/18 206 lb 12.8 oz (93.8 kg)    ------------------------------------------------------------------------  Hypertension, follow-up:  BP Readings from Last 3 Encounters:  07/08/18 139/82  04/06/18 (!) 142/80  04/02/18 (!) 144/88    She was last seen for hypertension 3 months ago.  BP at that visit was 142/80. Management since that visit includes noneContinue amlodipine, furosemide, and lisinopril. She reports excellent compliance with treatment. She is not having side effects.  She is adherent to low salt diet.   Outside blood pressures are not checking. She is experiencing lower extremity edema ankles. Patient denies chest pain, chest pressure/discomfort, exertional chest pressure/discomfort, fatigue, irregular heart beat,  near-syncope and palpitations.   Cardiovascular risk factors include advanced age (older than 19 for men, 18 for women), diabetes mellitus, dyslipidemia and hypertension.    ------------------------------------------------------------------------   No Known Allergies   Current Outpatient Medications:  .  alendronate (FOSAMAX) 70 MG tablet, TAKE 1 TABLET EVERY 7 DAYS WITH A FULL GLASS OF WATER ON AN EMPTY STOMACH, Disp: 12 tablet, Rfl: 4 .  amLODipine (NORVASC) 5 MG tablet, , Disp: , Rfl:  .  aspirin 81 MG tablet, Take 1 tablet (81 mg total) by mouth daily., Disp: 30 tablet, Rfl: 0 .  atorvastatin (LIPITOR) 80 MG tablet, TAKE 1 TABLET DAILY, Disp: 90 tablet, Rfl: 3 .  ferrous sulfate 325 (65 FE) MG tablet, Iron (ferrous sulfate) 325 mg (65 mg iron) tablet  TAKE 1 TABLET BY MOUTH TWICE A DAY, Disp: , Rfl:  .  furosemide (LASIX) 20 MG tablet, TAKE 1 TABLET DAILY, Disp: 90 tablet, Rfl: 4 .  HYDROcodone-acetaminophen (NORCO/VICODIN) 5-325 MG tablet, Take 1-2 tablets by mouth every 4 (four) hours as needed for moderate pain., Disp: 30 tablet, Rfl: 0 .  letrozole (FEMARA) 2.5 MG tablet, TAKE 1 TABLET DAILY, Disp: 90 tablet, Rfl: 4 .  lisinopril (PRINIVIL,ZESTRIL) 20 MG tablet, , Disp: , Rfl:  .  meloxicam (MOBIC) 15 MG tablet, TAKE 1 TABLET DAILY, Disp: 90 tablet, Rfl: 4 .  metFORMIN (GLUCOPHAGE) 500 MG tablet, TAKE 1 TABLET TWICE A DAY, Disp: 180 tablet, Rfl: 3 .  Multiple Vitamins-Minerals (CENTRUM SILVER PO), Take 1 tablet by mouth daily., Disp: ,  Rfl:  .  omeprazole (PRILOSEC) 20 MG capsule, TAKE 1 CAPSULE DAILY, Disp: 90 capsule, Rfl: 4 .  Omeprazole 20 MG TBEC, omeprazole 20 mg capsule,delayed release, Disp: , Rfl:  .  oxyCODONE (OXY IR/ROXICODONE) 5 MG immediate release tablet, oxycodone 5 mg tablet  TAKE 1 TABLET BY MOUTH EVERY 4 HOURS, Disp: , Rfl:  .  polyethylene glycol powder (GLYCOLAX/MIRALAX) powder, Take by mouth daily. , Disp: , Rfl: 0 .  traZODone (DESYREL) 50 MG tablet, TAKE 1  TABLET EVERY EVENING, Disp: 90 tablet, Rfl: 3 .  amoxicillin-clavulanate (AUGMENTIN) 875-125 MG tablet, amoxicillin 875 mg-potassium clavulanate 125 mg tablet  TAKE 1 TABLET BY MOUTH TWICE A DAY FOR 10 DAYS, Disp: , Rfl:  .  methylPREDNISolone (MEDROL DOSEPAK) 4 MG TBPK tablet, methylprednisolone 4 mg tablets in a dose pack  TAKE AS DIRECTED, Disp: , Rfl:  .  methylPREDNISolone (MEDROL DOSEPAK) 4 MG TBPK tablet, methylprednisolone 4 mg tablets in a dose pack  TAKE AS DIRECTED, Disp: , Rfl:   Review of Systems  Constitutional: Negative.   Respiratory: Negative.   Cardiovascular: Negative.   Gastrointestinal: Negative.   Neurological: Negative.   Psychiatric/Behavioral: Negative.     Social History   Tobacco Use  . Smoking status: Never Smoker  . Smokeless tobacco: Never Used  Substance Use Topics  . Alcohol use: No      Objective:   BP 139/82 (BP Location: Left Arm, Patient Position: Sitting, Cuff Size: Large)   Pulse 89   Temp 98.3 F (36.8 C) (Oral)   Resp 16   Wt 206 lb (93.4 kg)   BMI 33.25 kg/m  Vitals:   07/08/18 0828  BP: 139/82  Pulse: 89  Resp: 16  Temp: 98.3 F (36.8 C)  TempSrc: Oral  Weight: 206 lb (93.4 kg)     Physical Exam Vitals signs reviewed.  Constitutional:      General: She is not in acute distress.    Appearance: Normal appearance. She is well-developed. She is obese. She is not diaphoretic.  Neck:     Musculoskeletal: Normal range of motion and neck supple.  Cardiovascular:     Rate and Rhythm: Normal rate and regular rhythm.     Heart sounds: Normal heart sounds. No murmur. No friction rub. No gallop.   Pulmonary:     Effort: Pulmonary effort is normal. No respiratory distress.     Breath sounds: Normal breath sounds. No wheezing or rales.  Neurological:     Mental Status: She is alert.         Assessment & Plan    1. Type 2 diabetes mellitus with stage 3 chronic kidney disease, without long-term current use of insulin  (HCC) A1c stable at 6.2. Patient denies any lows. Continue metformin 500mg  BID. I will see her back in 3 months for AWV/CPE. - POCT glycosylated hemoglobin (Hb A1C)  2. Benign hypertension with CKD (chronic kidney disease) stage III (HCC) Stable. Continue lisinopril 20mg , amlodipine 5mg .      Mar Daring, PA-C  [place]

## 2018-07-08 ENCOUNTER — Ambulatory Visit (INDEPENDENT_AMBULATORY_CARE_PROVIDER_SITE_OTHER): Payer: Medicare Other | Admitting: Physician Assistant

## 2018-07-08 ENCOUNTER — Encounter: Payer: Self-pay | Admitting: Physician Assistant

## 2018-07-08 VITALS — BP 139/82 | HR 89 | Temp 98.3°F | Resp 16 | Wt 206.0 lb

## 2018-07-08 DIAGNOSIS — I129 Hypertensive chronic kidney disease with stage 1 through stage 4 chronic kidney disease, or unspecified chronic kidney disease: Secondary | ICD-10-CM | POA: Diagnosis not present

## 2018-07-08 DIAGNOSIS — N183 Chronic kidney disease, stage 3 (moderate): Secondary | ICD-10-CM | POA: Diagnosis not present

## 2018-07-08 DIAGNOSIS — E1122 Type 2 diabetes mellitus with diabetic chronic kidney disease: Secondary | ICD-10-CM | POA: Diagnosis not present

## 2018-07-08 LAB — POCT GLYCOSYLATED HEMOGLOBIN (HGB A1C)
Est. average glucose Bld gHb Est-mCnc: 131
Hemoglobin A1C: 6.2 % — AB (ref 4.0–5.6)

## 2018-07-22 ENCOUNTER — Other Ambulatory Visit: Payer: Self-pay | Admitting: General Surgery

## 2018-10-02 ENCOUNTER — Ambulatory Visit: Payer: Self-pay

## 2018-10-06 NOTE — Progress Notes (Signed)
Patient: Robin Ashley Female    DOB: 1932/04/24   85 y.o.   MRN: [id number] Visit Date: 10/07/2018  Today's Provider: Mar Daring, PA-C   No chief complaint on file.  Subjective:     HPI   Type 2 diabetes mellitus with stage 3 chronic kidney disease, without long-term current use of insulin ([place]) From 07/08/2018-stable at 6.2. Continue metformin 500mg  BID.    Lab Results  Component Value Date   HGBA1C 6.4 (A) 10/07/2018   HGBA1C 6.2 (A) 07/08/2018   HGBA1C 6.3 (A) 04/06/2018    Benign hypertension with CKD (chronic kidney disease) stage III (HCC) From 07/08/2018-Stable. Continue lisinopril 20mg , amlodipine 5mg .    No Known Allergies   Current Outpatient Medications:  .  alendronate (FOSAMAX) 70 MG tablet, TAKE 1 TABLET EVERY 7 DAYS WITH A FULL GLASS OF WATER ON AN EMPTY STOMACH, Disp: 12 tablet, Rfl: 4 .  amLODipine (NORVASC) 5 MG tablet, , Disp: , Rfl:  .  amoxicillin-clavulanate (AUGMENTIN) 875-125 MG tablet, amoxicillin 875 mg-potassium clavulanate 125 mg tablet  TAKE 1 TABLET BY MOUTH TWICE A DAY FOR 10 DAYS, Disp: , Rfl:  .  aspirin 81 MG tablet, Take 1 tablet (81 mg total) by mouth daily., Disp: 30 tablet, Rfl: 0 .  atorvastatin (LIPITOR) 80 MG tablet, TAKE 1 TABLET DAILY, Disp: 90 tablet, Rfl: 3 .  ferrous sulfate 325 (65 FE) MG tablet, Iron (ferrous sulfate) 325 mg (65 mg iron) tablet  TAKE 1 TABLET BY MOUTH TWICE A DAY, Disp: , Rfl:  .  furosemide (LASIX) 20 MG tablet, TAKE 1 TABLET DAILY, Disp: 90 tablet, Rfl: 4 .  HYDROcodone-acetaminophen (NORCO/VICODIN) 5-325 MG tablet, Take 1-2 tablets by mouth every 4 (four) hours as needed for moderate pain., Disp: 30 tablet, Rfl: 0 .  letrozole (FEMARA) 2.5 MG tablet, TAKE 1 TABLET DAILY, Disp: 90 tablet, Rfl: 4 .  lisinopril (PRINIVIL,ZESTRIL) 20 MG tablet, , Disp: , Rfl:  .  meloxicam (MOBIC) 15 MG tablet, TAKE 1 TABLET DAILY, Disp: 90 tablet, Rfl: 4 .  metFORMIN (GLUCOPHAGE) 500 MG tablet, TAKE 1 TABLET  TWICE A DAY, Disp: 180 tablet, Rfl: 3 .  Multiple Vitamins-Minerals (CENTRUM SILVER PO), Take 1 tablet by mouth daily., Disp: , Rfl:  .  omeprazole (PRILOSEC) 20 MG capsule, TAKE 1 CAPSULE DAILY, Disp: 90 capsule, Rfl: 4 .  Omeprazole 20 MG TBEC, omeprazole 20 mg capsule,delayed release, Disp: , Rfl:  .  oxyCODONE (OXY IR/ROXICODONE) 5 MG immediate release tablet, oxycodone 5 mg tablet  TAKE 1 TABLET BY MOUTH EVERY 4 HOURS, Disp: , Rfl:  .  polyethylene glycol powder (GLYCOLAX/MIRALAX) powder, Take by mouth daily. , Disp: , Rfl: 0 .  traZODone (DESYREL) 50 MG tablet, TAKE 1 TABLET EVERY EVENING, Disp: 90 tablet, Rfl: 3 .  methylPREDNISolone (MEDROL DOSEPAK) 4 MG TBPK tablet, methylprednisolone 4 mg tablets in a dose pack  TAKE AS DIRECTED, Disp: , Rfl:  .  methylPREDNISolone (MEDROL DOSEPAK) 4 MG TBPK tablet, methylprednisolone 4 mg tablets in a dose pack  TAKE AS DIRECTED, Disp: , Rfl:   Review of Systems  Constitutional: Negative for appetite change, chills, fatigue and fever.  Eyes: Negative for visual disturbance.  Respiratory: Negative for chest tightness and shortness of breath.   Cardiovascular: Negative for chest pain and palpitations.  Gastrointestinal: Negative for abdominal pain, nausea and vomiting.  Endocrine: Negative for polydipsia and polyuria.  Neurological: Negative for dizziness and weakness.    Social History  Tobacco Use  . Smoking status: Never Smoker  . Smokeless tobacco: Never Used  Substance Use Topics  . Alcohol use: No      Objective:   BP (!) 147/82 (BP Location: Left Arm, Patient Position: Sitting, Cuff Size: Large)   Pulse 92   Temp 98.6 F (37 C) (Oral)   Resp 16   Wt 207 lb (93.9 kg)   BMI 33.41 kg/m  Vitals:   10/07/18 0807  BP: (!) 147/82  Pulse: 92  Resp: 16  Temp: 98.6 F (37 C)  TempSrc: Oral  Weight: 207 lb (93.9 kg)     Physical Exam Vitals signs reviewed.  Constitutional:      General: She is not in acute distress.     Appearance: Normal appearance. She is well-developed. She is obese. She is not ill-appearing or diaphoretic.  HENT:     Head: Normocephalic and atraumatic.  Eyes:     General: No scleral icterus.    Extraocular Movements: Extraocular movements intact.     Pupils: Pupils are equal, round, and reactive to light.  Neck:     Musculoskeletal: Normal range of motion and neck supple.     Thyroid: No thyromegaly.     Vascular: No JVD.     Trachea: No tracheal deviation.  Cardiovascular:     Rate and Rhythm: Normal rate and regular rhythm.     Heart sounds: Normal heart sounds. No murmur. No friction rub. No gallop.   Pulmonary:     Effort: Pulmonary effort is normal. No respiratory distress.     Breath sounds: Normal breath sounds. No wheezing or rales.  Lymphadenopathy:     Cervical: No cervical adenopathy.  Neurological:     Mental Status: She is alert.  Psychiatric:        Mood and Affect: Mood normal.        Behavior: Behavior normal.        Thought Content: Thought content normal.        Judgment: Judgment normal.         Assessment & Plan    1. Type 2 diabetes mellitus with stage 3 chronic kidney disease, without long-term current use of insulin (HCC) A1c today 6.4. Still well controlled. Denies any hypoglycemic episodes. Continue metformin 500mg  BID at this time. I will see her back in 3 months.  - POCT glycosylated hemoglobin (Hb A1C)     Mar Daring, PA-C  [facility]

## 2018-10-07 ENCOUNTER — Other Ambulatory Visit: Payer: Self-pay

## 2018-10-07 ENCOUNTER — Ambulatory Visit (INDEPENDENT_AMBULATORY_CARE_PROVIDER_SITE_OTHER): Payer: Medicare Other | Admitting: Physician Assistant

## 2018-10-07 ENCOUNTER — Encounter: Payer: Self-pay | Admitting: Physician Assistant

## 2018-10-07 VITALS — BP 147/82 | HR 92 | Temp 98.6°F | Resp 16 | Wt 207.0 lb

## 2018-10-07 DIAGNOSIS — N183 Chronic kidney disease, stage 3 unspecified: Secondary | ICD-10-CM

## 2018-10-07 DIAGNOSIS — E1122 Type 2 diabetes mellitus with diabetic chronic kidney disease: Secondary | ICD-10-CM | POA: Diagnosis not present

## 2018-10-07 LAB — POCT GLYCOSYLATED HEMOGLOBIN (HGB A1C)
Est. average glucose Bld gHb Est-mCnc: 137
Hemoglobin A1C: 6.4 % — AB (ref 4.0–5.6)

## 2018-10-16 IMAGING — CT CT PELVIS W/O CM
2 of 3 series · 15 of 46 positions shown, 17 images · non-contrast
Comparison: None.

CLINICAL DATA: Right hip pain after fall today

EXAM:
CT PELVIS WITHOUT CONTRAST
TECHNIQUE: Multidetector CT imaging of the pelvis was performed following the
standard protocol without intravenous contrast.

[Series 3: axial st · axial · 0.88mm/px · z∈[-1035,-815]mm · 12 of 128 slices shown, 14 images]
[im 9/128  soft-tissue]
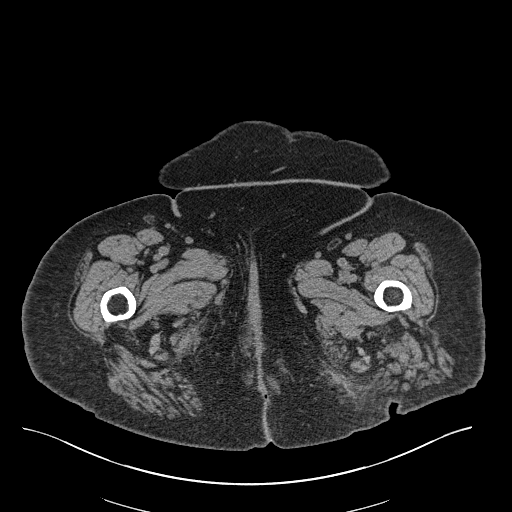
[im 9/128  bone]
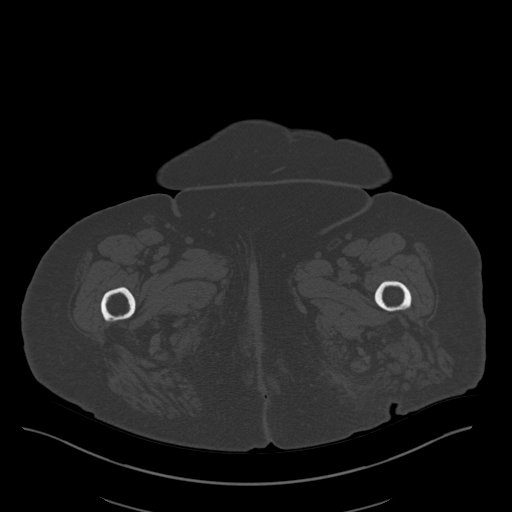
[im 17/128  soft-tissue]
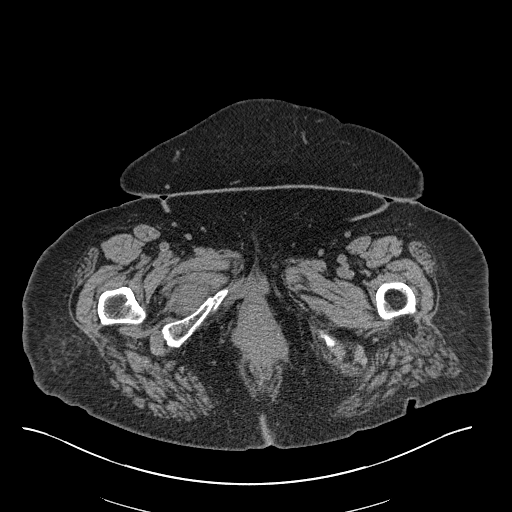
[im 29/128  soft-tissue]
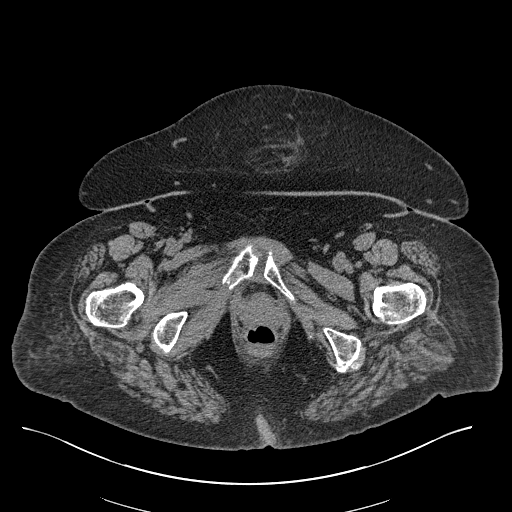
[im 37/128  soft-tissue]
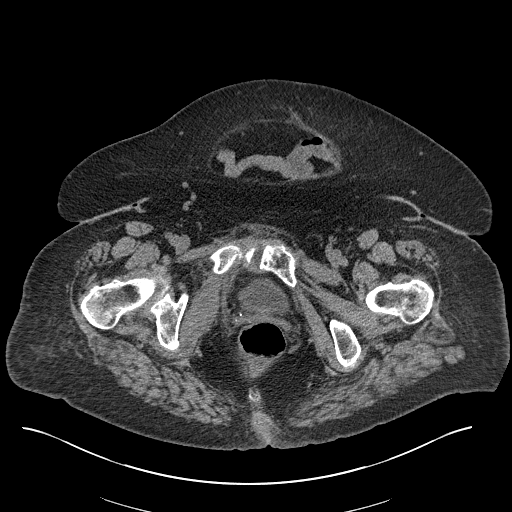
[im 50/128  soft-tissue]
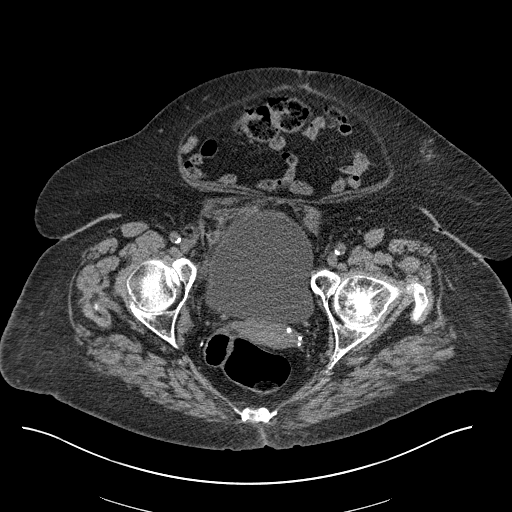
[im 58/128  soft-tissue]
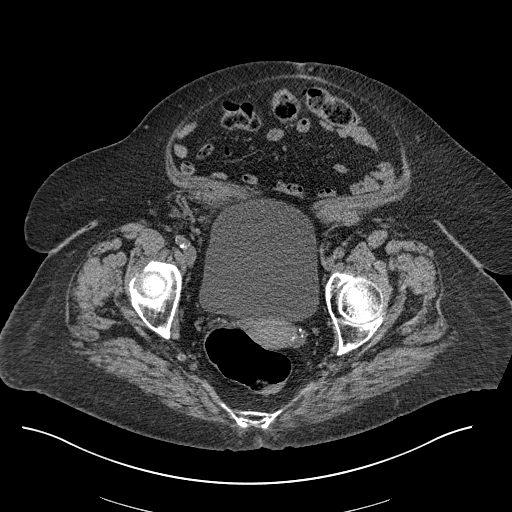
[im 70/128  soft-tissue]
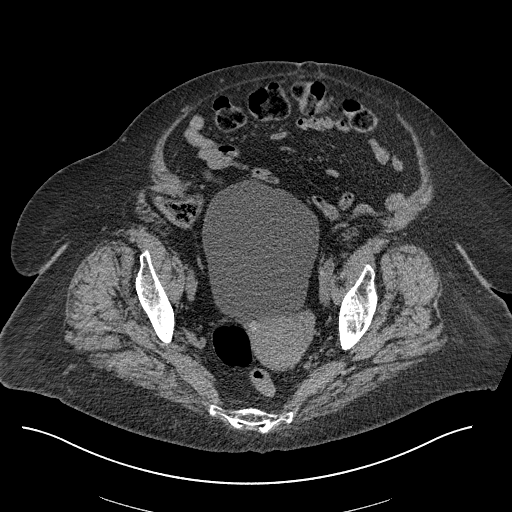
[im 78/128  soft-tissue]
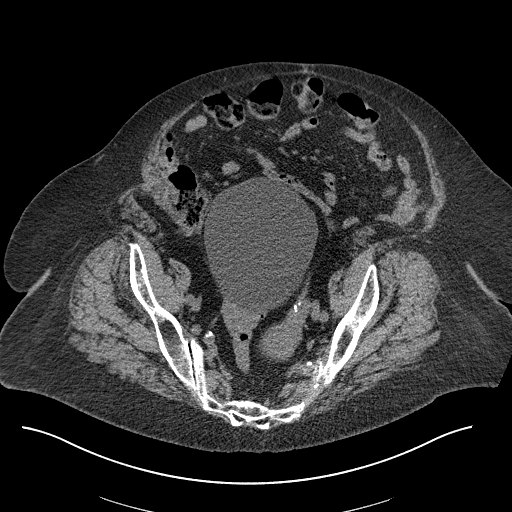
[im 91/128  soft-tissue]
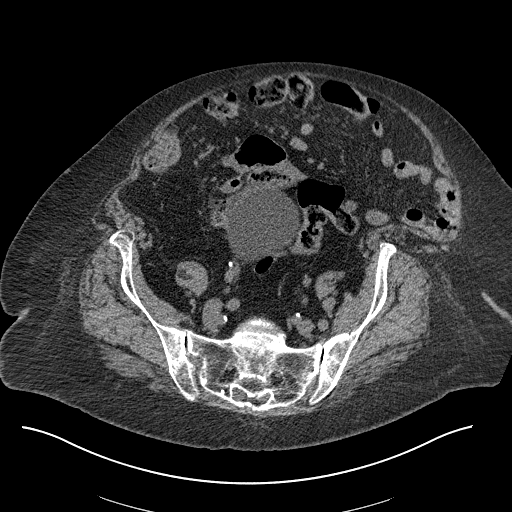
[im 91/128  bone]
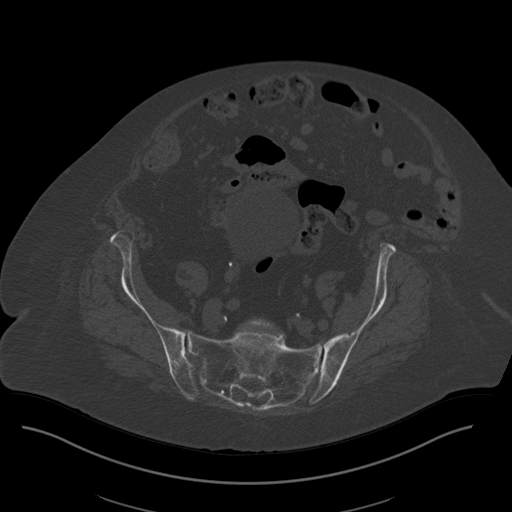
[im 99/128  soft-tissue]
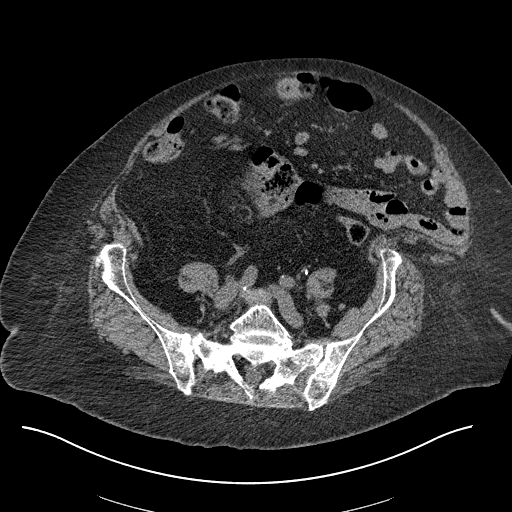
[im 111/128  soft-tissue]
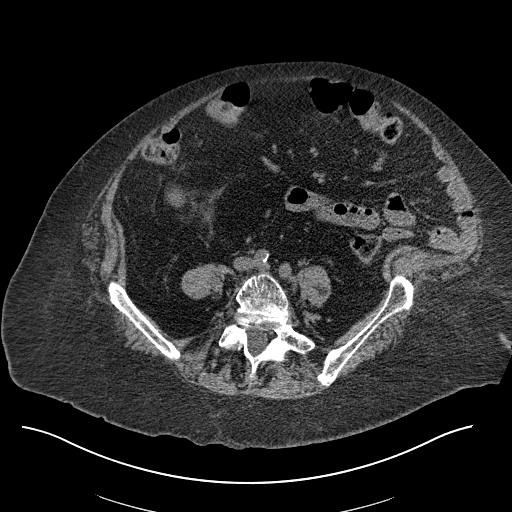
[im 119/128  soft-tissue]
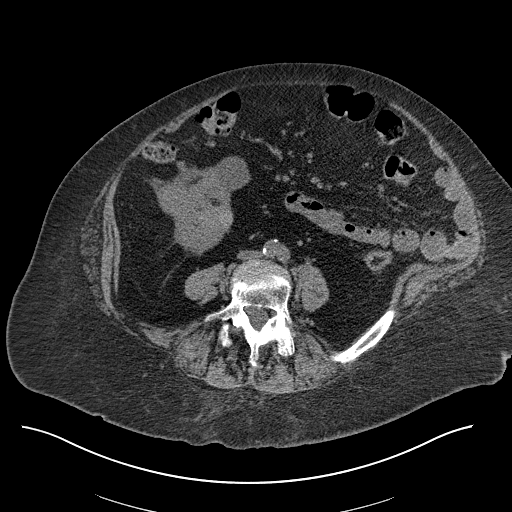

[Series 6: coronal st · coronal · 0.58mm/px · 3 of 176 slices shown]
[im 59/176  soft-tissue]
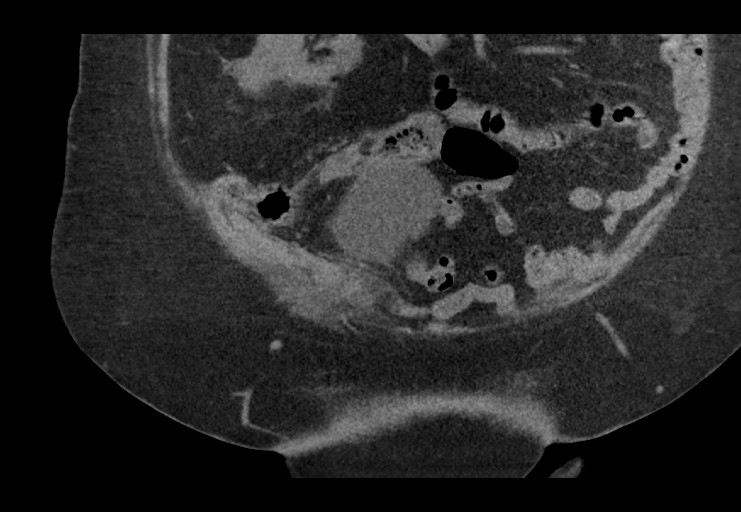
[im 78/176  soft-tissue]
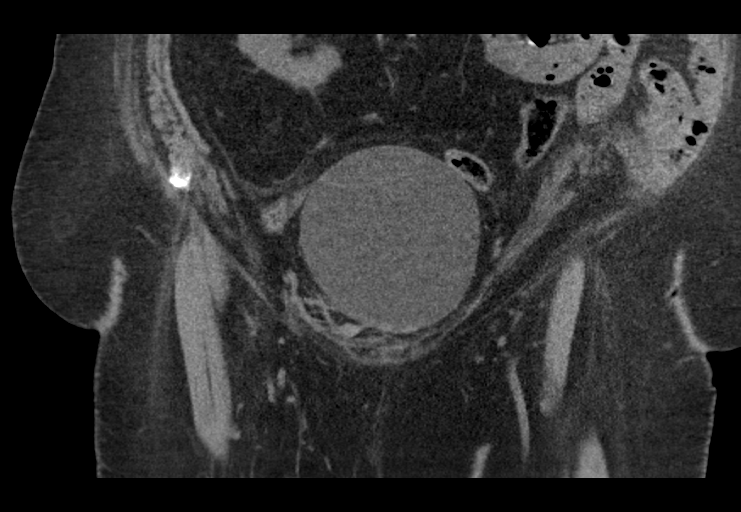
[im 98/176  soft-tissue]
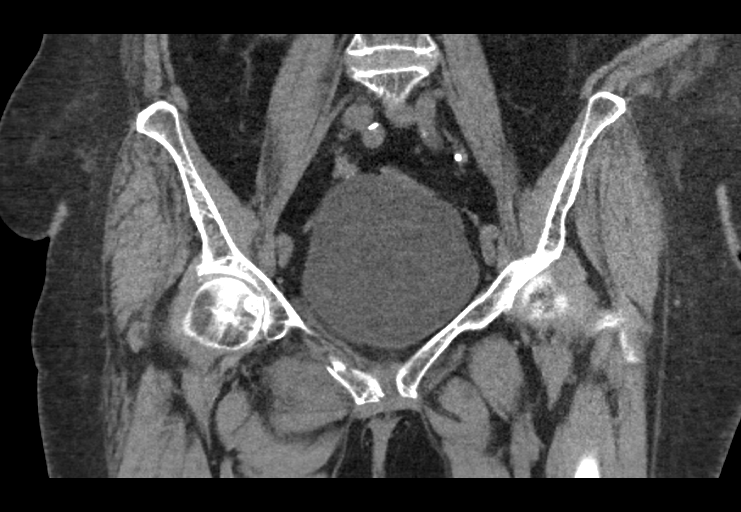

[15 of 46 positions shown; findings below may reference images not displayed]

FINDINGS: Urinary Tract: Exophytic lesions of the visualized right kidney are
noted two which are not simple cysts but may represent complex cysts
measuring 2.7 cm in diameter and 1 cm respectively along the lateral
as well as medial aspect of the kidney. A larger exophytic 3.4 cm
simple cyst is noted with water attenuation anteriorly. These can be
further correlated with nonemergent ultrasound as deemed clinically
necessary.

Bowel:  No acute bowel obstruction or inflammation.

Vascular/Lymphatic: Aortoiliac atherosclerosis without aneurysm. No
lymphadenopathy.

Reproductive: Uterus and ovaries are visualized and demonstrate no
acute appearing abnormalities. Idiopathic calcifications are noted
of the ovaries.

Other:  No free air or free fluid.

Musculoskeletal: Acute minimally displaced fractures of the right
superior pubic ramus near the pubic symphysis and of the mid
inferior pubic ramus. Both hip joints maintained. Osteoarthritis of
the SI joints with sclerosis bilaterally. Grade 1 anterolisthesis of
L4 on L5 likely on the basis of degenerative facet arthropathy with
degenerative disc disease.
IMPRESSION: 1. Acute minimally displaced fractures of the right superior pubic
ramus near the pubic symphysis and of the mid inferior pubic ramus.
2. Osteoarthritis of the SI joints with sclerosis bilaterally.
3. Grade 1 anterolisthesis of L4 on L5 with degenerative disc
disease.
4. Simple and likely complex right renal cysts which could be
further correlated with nonemergent ultrasound or dedicated CT
without and with IV contrast as deemed clinically necessary.

## 2018-10-16 IMAGING — CR DG HIP (WITH OR WITHOUT PELVIS) 2-3V*R*
3 series · 3 of 3 positions shown · non-contrast
Comparison: None.

CLINICAL DATA: Fell on the hip, pain

EXAM:
DG HIP (WITH OR WITHOUT PELVIS) 2-3V RIGHT

[pelvis ap]
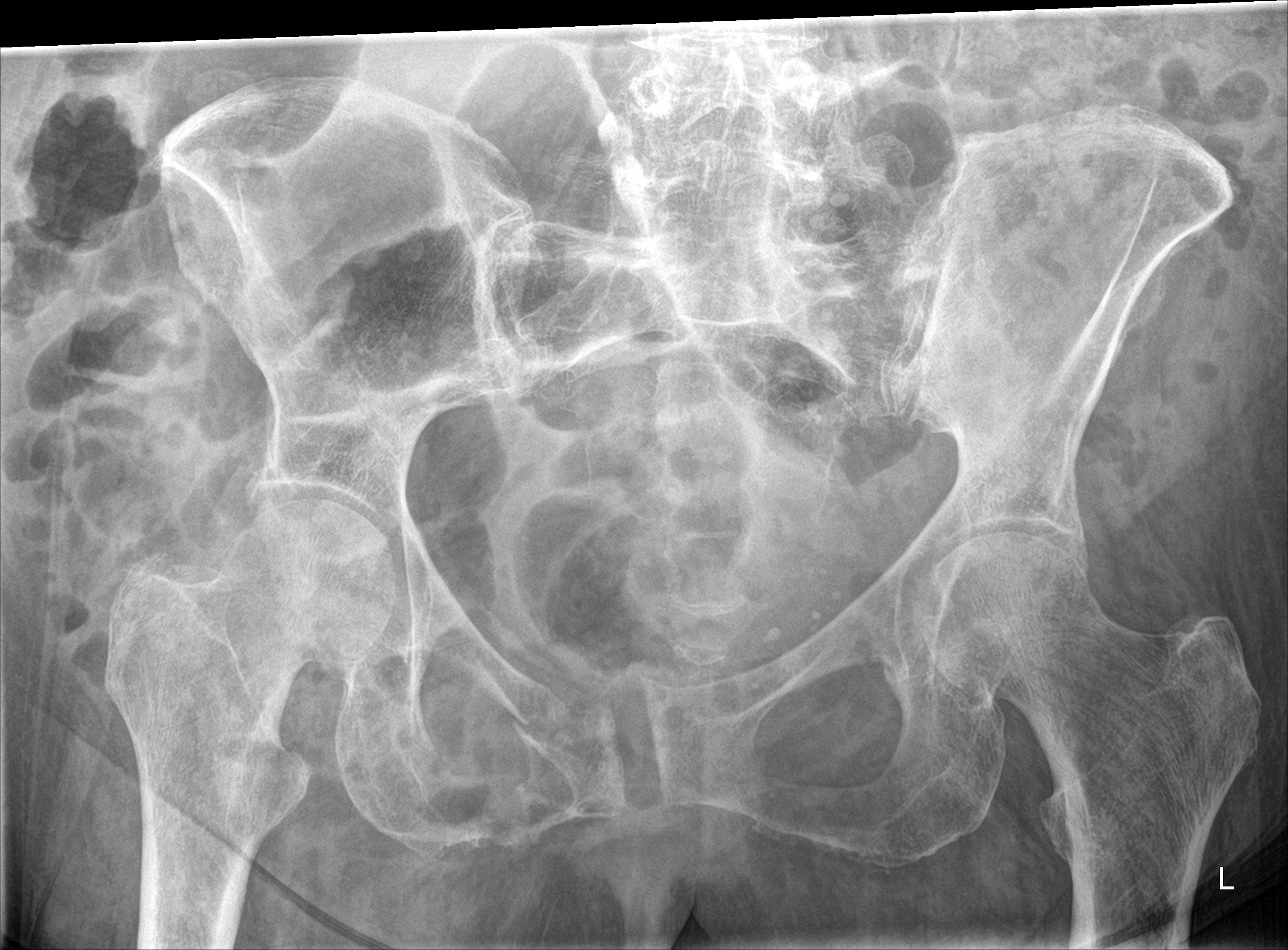

[hip ap]
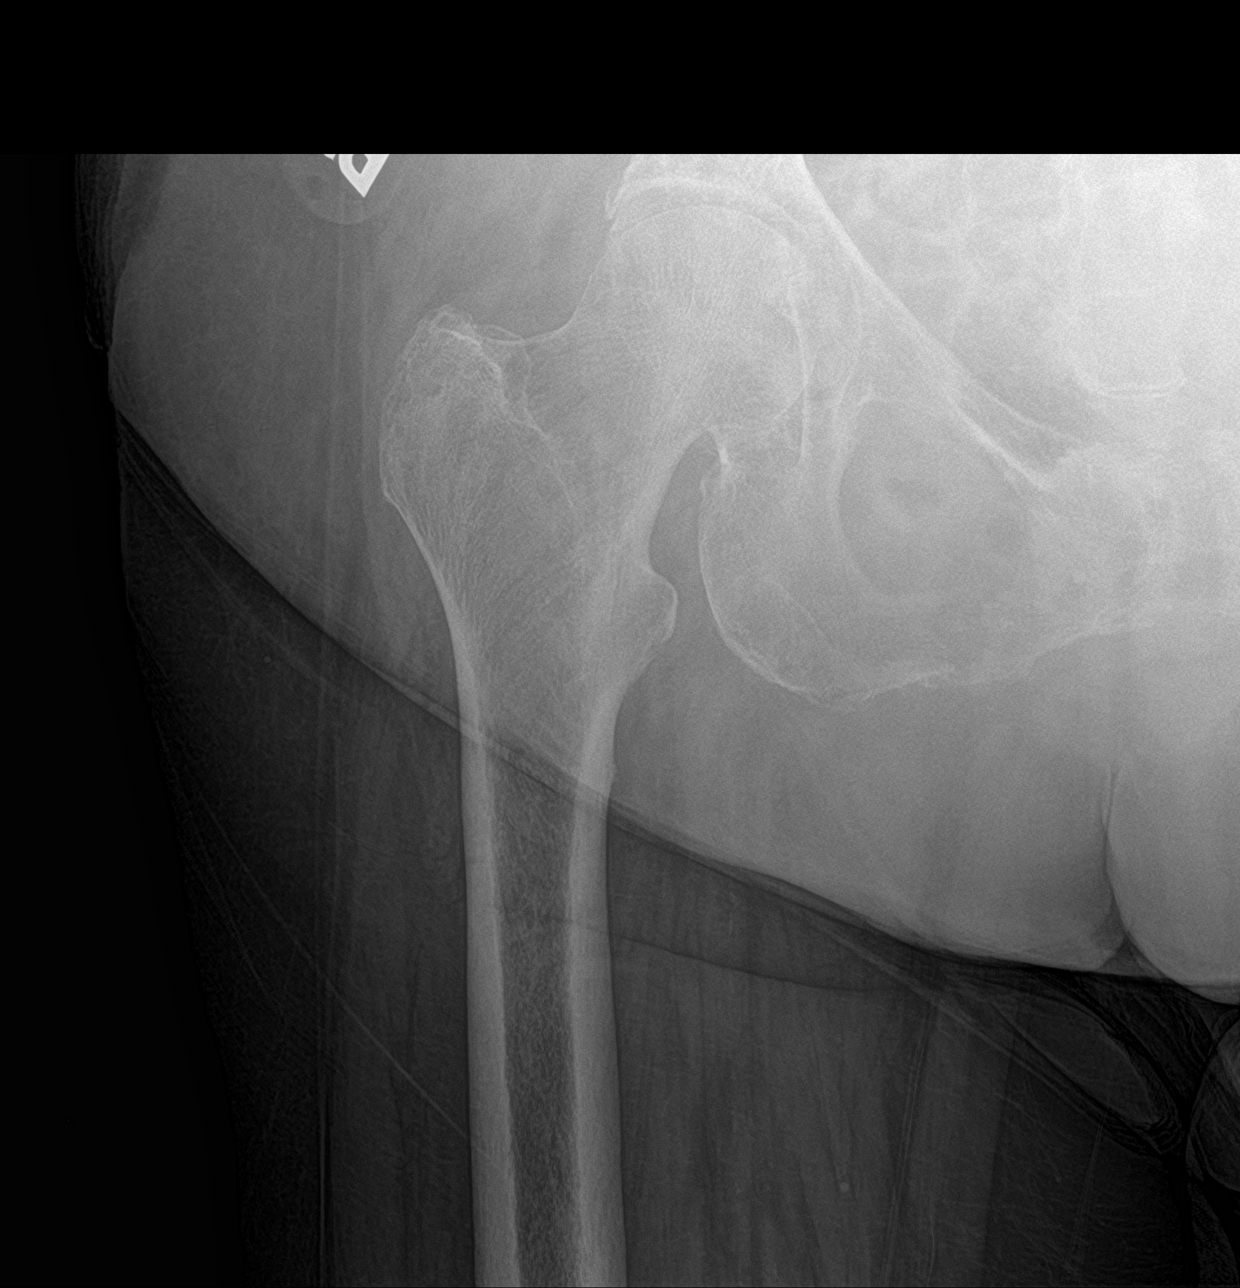

[hip lat]
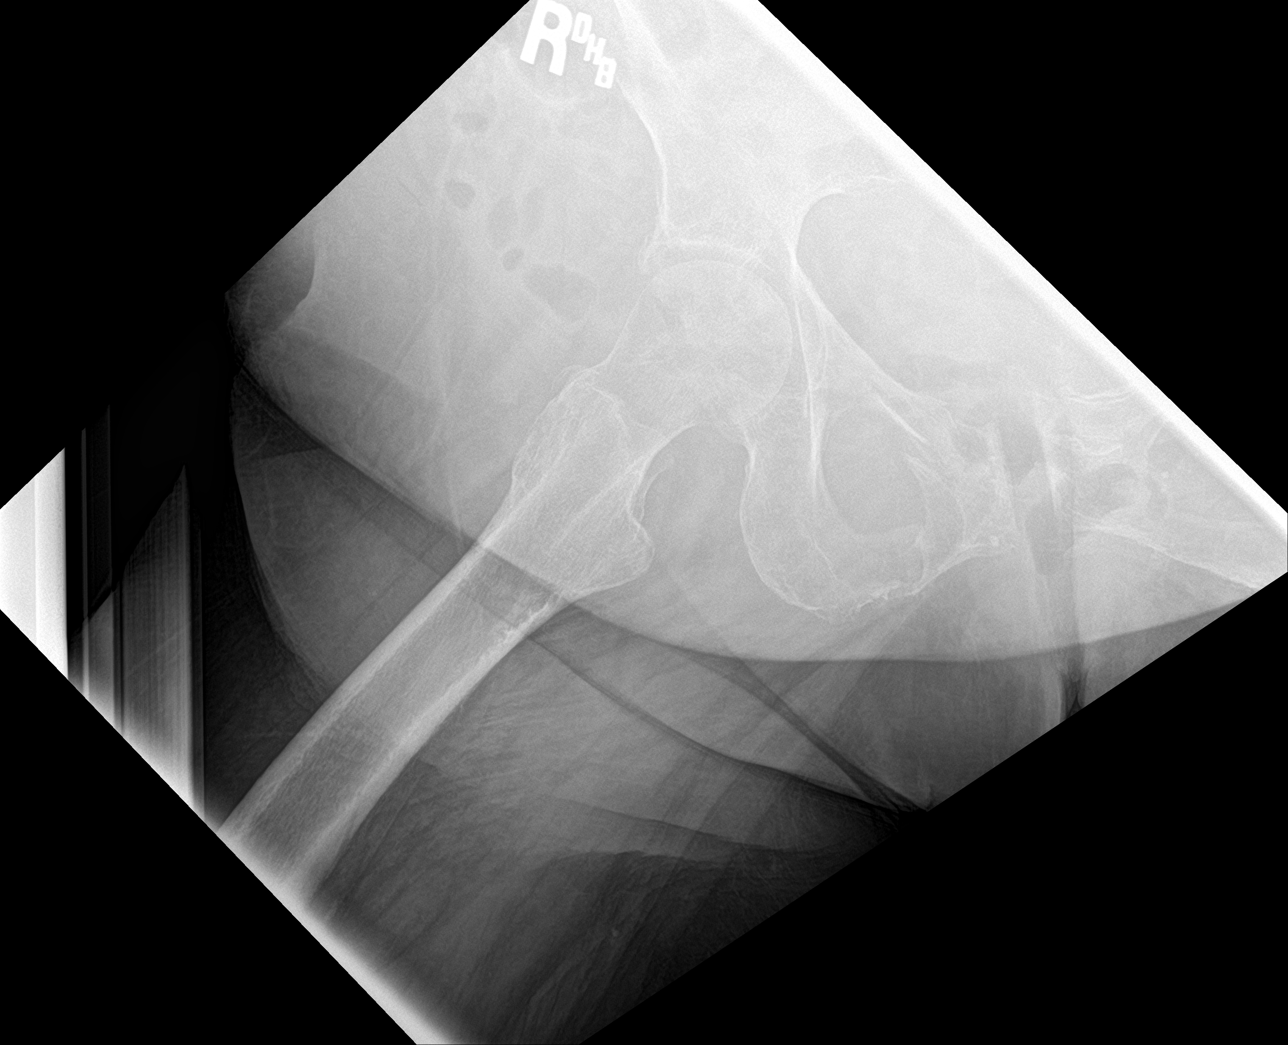

[3 of 3 positions shown; findings below may reference images not displayed]

FINDINGS: The femoral heads appear normally positioned. There is slight
widening of the pubic symphysis up to 12 mm. Mildly displaced
fractures of the right superior and inferior pubic rami.
IMPRESSION: 1. Acute mildly displaced fractures of the right superior and
inferior pubic rami
2. Mild widening/diastasis of the pubic symphysis up to 12 mm.

## 2018-10-20 ENCOUNTER — Ambulatory Visit (INDEPENDENT_AMBULATORY_CARE_PROVIDER_SITE_OTHER): Payer: Medicare Other

## 2018-10-20 ENCOUNTER — Other Ambulatory Visit: Payer: Self-pay

## 2018-10-20 VITALS — Ht 66.0 in

## 2018-10-20 DIAGNOSIS — Z0000 Encounter for general adult medical examination without abnormal findings: Secondary | ICD-10-CM | POA: Diagnosis not present

## 2018-10-20 NOTE — Progress Notes (Addendum)
Subjective:   Robin Ashley is a 85 y.o. female who presents for Medicare Annual (Subsequent) preventive examination.  Review of Systems:  N/A  Cardiac Risk Factors include: advanced age (>52mn, >>65women);diabetes mellitus;dyslipidemia;hypertension;obesity (BMI >30kg/m2)     Objective:     Vitals: Ht _0  (1.676 m)   BMI 33.41 kg/m   Body mass index is 33.41 kg/m.  Advanced Directives 10/20/2018 09/30/2017 09/27/2016 09/27/2016 09/26/2016 12/27/2015 10/16/2015  Does Patient Have a Medical Advance Directive? Yes Yes No Yes Yes Yes Yes  Type of AParamedicof APoynet[place]Living will [place] will - Living will Living will Living will;Healthcare Power of [place]  Does patient want to make changes to medical advance directive? - - - - - No - Patient declined -  Copy of [place]? No - copy requested No - copy requested - - - - -  Would patient like information on creating a medical advance directive? - - No - Patient declined - - - -    Tobacco Social History   Tobacco Use  Smoking Status Never Smoker  Smokeless Tobacco Never Used     Counseling given: Not Answered   Clinical Intake:  Pre-visit preparation completed: Yes  Pain : No/denies pain     Nutritional Status: BMI > 30  Obese Nutritional Risks: None Diabetes: Yes  How often do you need to have someone help you when you read instructions, pamphlets, or other written materials from your doctor or pharmacy?: 1 - Never   Diabetes:  Is the patient diabetic?  Yes type 2 If diabetic, was a CBG obtained today?  No  Did the patient bring in their glucometer from home?  No  How often do you monitor your CBG's? Does not check BS.   Financial Strains and Diabetes Management:  Are you having any financial strains with the device, your supplies or your medication? No .  Does the patient want to be  seen by Chronic Care Management for management of their diabetes?  No  Would the patient like to be referred to a Nutritionist or for Diabetic Management?  No   Diabetic Exams:  Diabetic Eye Exam: Completed 10/16/17. Overdue for diabetic eye exam. Pt has an eye exam scheduled for tomorrow, 10/21/18 with Dr NMatilde Sprang  Diabetic Foot Exam: Completed 04/06/18.   Interpreter Needed?: No  Information [facility] :: [facility]  Past Medical History:  Diagnosis Date  . 174.4 03/2013   Left breast, T1c, N0, 12 mm; ER PR positive, HER-2/neu not over expressed. Not a candidate [facility] adjuvant [facility]  . Diabetes mellitus without complication ([place]   . Foot drop, right October 2014   noted post mastectomy, conservative treatment was instituted with resolution, mild edema.  . Hyperlipidemia   . Hypertension   . Sleep apnea    Past Surgical History:  Procedure Laterality Date  . BREAST BIOPSY Left 02/25/13   positive  . BREAST SURGERY Left 03-15-13   left mastectomywith SN biopsy  . COLON SURGERY  1991   Likely segmental resection for diverticulitis based on patient description.  . COLONOSCOPY    . EYE SURGERY Bilateral 04/2012  . intestinal balloon removed  1991   Likely surgery for diverticulitis.  .Marland KitchenMASTECTOMY Left 2014   positive  . 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   Family History  Problem Relation Age of Onset  . Cancer Brother        colon  . Diabetes Brother   . Heart disease Brother   . Diabetes Brother   . Lung cancer Son   . Breast cancer Neg Hx    Social History   Socioeconomic History  . Marital status: Widowed    Spouse name: Not on file  . Number of children: 5  . Years of education: Not on file  . Highest education level: 12th grade  Occupational History  . Occupation: retired  Scientific laboratory technician  . Financial resource strain: Not hard at all  . Food insecurity:    Worry: Never  true    Inability: Never true  . Transportation needs:    Medical: No    Non-medical: No  Tobacco Use  . Smoking status: Never Smoker  . Smokeless tobacco: Never Used  Substance and Sexual Activity  . Alcohol use: No  . Drug use: No  . Sexual activity: Never  Lifestyle  . Physical activity:    Days per week: 0 days    Minutes per session: 0 min  . Stress: Not at all  Relationships  . Social connections:    Talks on phone: Patient refused    Gets together: Patient refused    Attends religious service: Patient refused    Active member of club or organization: Patient refused    Attends meetings of clubs or organizations: Patient refused    Relationship status: Patient refused  Other Topics Concern  . Not on file  Social History Narrative  . Not on file    Outpatient Encounter Medications as of 10/20/2018  Medication Sig  . alendronate (FOSAMAX) 70 MG tablet TAKE 1 TABLET EVERY 7 DAYS WITH A FULL GLASS OF WATER ON AN EMPTY STOMACH  . amLODipine (NORVASC) 5 MG tablet   . aspirin 81 MG tablet Take 1 tablet (81 mg total) by mouth daily.  Marland Kitchen atorvastatin (LIPITOR) 80 MG tablet TAKE 1 TABLET DAILY  . ferrous sulfate 325 (65 FE) MG tablet Iron (ferrous sulfate) 325 mg (65 mg iron) tablet  TAKE 1 TABLET BY MOUTH TWICE A DAY  . furosemide (LASIX) 20 MG tablet TAKE 1 TABLET DAILY  . letrozole (FEMARA) 2.5 MG tablet TAKE 1 TABLET DAILY  . lisinopril (PRINIVIL,ZESTRIL) 20 MG tablet   . meloxicam (MOBIC) 15 MG tablet TAKE 1 TABLET DAILY  . metFORMIN (GLUCOPHAGE) 500 MG tablet TAKE 1 TABLET TWICE A DAY  . methylPREDNISolone (MEDROL DOSEPAK) 4 MG TBPK tablet methylprednisolone 4 mg tablets in a dose pack  TAKE AS DIRECTED  . Multiple Vitamins-Minerals (CENTRUM SILVER PO) Take 1 tablet by mouth daily.  Marland Kitchen omeprazole (PRILOSEC) 20 MG capsule TAKE 1 CAPSULE DAILY  . Omeprazole 20 MG TBEC omeprazole 20 mg capsule,delayed release  . polyethylene glycol powder (GLYCOLAX/MIRALAX) powder Take by  mouth daily.   . traZODone (DESYREL) 50 MG tablet TAKE 1 TABLET EVERY EVENING  . amoxicillin-clavulanate (AUGMENTIN) 875-125 MG tablet amoxicillin 875 mg-potassium clavulanate 125 mg tablet  TAKE 1 TABLET BY MOUTH TWICE A DAY FOR 10 DAYS  . HYDROcodone-acetaminophen (NORCO/VICODIN) 5-325 MG tablet Take 1-2 tablets by mouth every 4 (four) hours as needed for moderate pain. (Patient not taking: Reported on 10/20/2018)  . methylPREDNISolone (MEDROL DOSEPAK) 4 MG TBPK tablet methylprednisolone 4 mg tablets in a dose pack  TAKE AS DIRECTED  . oxyCODONE (OXY IR/ROXICODONE) 5 MG immediate release tablet oxycodone 5 mg tablet  TAKE 1 TABLET  BY MOUTH EVERY 4 HOURS   No facility-administered encounter medications on file as of 10/20/2018.     Activities of Daily Living In your present state of health, do you have any difficulty performing the following activities: 10/20/2018  Hearing? N  Vision? N  Comment Wears eye glasses daily.   Difficulty concentrating or making decisions? N  Walking or climbing stairs? N  Dressing or bathing? N  Doing errands, shopping? N  Preparing Food and eating ? N  Using the Toilet? N  In the past six months, have you accidently leaked urine? N  Do you have problems with loss of bowel control? N  Managing your Medications? N  Managing your Finances? N  Housekeeping or managing your Housekeeping? Y  Comment Has assistance with cleaning.   Some recent data might be hidden    Patient Care Team: Rubye Beach as PCP - General (Family Medicine) Bary Castilla, Forest Gleason, MD (General Surgery) Rayetta Humphrey as Consulting Physician (Optometry)    Assessment:   This is a routine wellness examination for [place].  Exercise Activities and Dietary recommendations Current Exercise Habits: The patient does not participate in regular exercise at present, Exercise limited by: None identified  Goals    . DIET - DECREASE SODA OR JUICE INTAKE     Recommend to  decrease amount of sodas to 1 a day.     . Diet- Decrease carbohydrates     Recommend cutting back on carbohydrate intake and monitoring intake.    . Increase water intake     Recommend increasing water intake to at least 2 glasses a day. Pt declines anymore.       Fall Risk: Fall Risk  10/20/2018 04/02/2018 09/30/2017 09/26/2016 02/17/2015  Falls in the past year? 0 No No No No    FALL RISK PREVENTION PERTAINING TO THE HOME:  Any stairs in or around the home? No  If so, are there any without handrails? N/A  Home free of loose throw rugs in walkways, pet beds, electrical cords, etc? Yes  Adequate lighting in your home to reduce risk of falls? Yes   ASSISTIVE DEVICES UTILIZED TO PREVENT FALLS:  Life alert? Yes  Use of a cane, walker or w/c? No  Grab bars in the bathroom? Yes  Shower chair or bench in shower? Yes  Elevated toilet seat or a handicapped toilet? Yes   TIMED UP AND GO:  Was the test performed? No .    Depression Screen PHQ 2/9 Scores 10/20/2018 10/07/2018 09/30/2017 09/26/2016  PHQ - 2 Score 0 0 0 0  PHQ- 9 Score - - - 4     Cognitive Function     6CIT Screen 10/20/2018 09/26/2016  What Year? 0 points 0 points  What month? 0 points 0 points  What time? 0 points 0 points  Count back from 20 0 points 0 points  Months in reverse 0 points 0 points  Repeat phrase 0 points 4 points  Total Score 0 4    Immunization History  Administered Date(s) Administered  . Influenza Split 03/14/2010, 02/28/2011, 02/27/2012, 01/27/2013  . Influenza, High Dose Seasonal PF 02/17/2015, 03/06/2016, 03/28/2017, 04/06/2018  . Pneumococcal Conjugate-13 03/07/2014  . Pneumococcal Polysaccharide-23 06/19/2015  . Tdap 02/28/2011  . Zoster 05/11/2012    Qualifies for Shingles Vaccine? Yes  Zostavax completed 05/11/12. Due for Shingrix. Education has been provided regarding the importance of this vaccine. Pt has been advised to call insurance company to determine out of  pocket  expense. Advised may also receive vaccine at local pharmacy or Health Dept. Verbalized acceptance and understanding.  Tdap: Up to date  Flu Vaccine: Up to date  Pneumococcal Vaccine: Up to date  Screening Tests Health Maintenance  Topic Date Due  . OPHTHALMOLOGY EXAM  10/17/2018  . INFLUENZA VACCINE  01/02/2019  . FOOT EXAM  04/07/2019  . HEMOGLOBIN A1C  04/09/2019  . DEXA SCAN  04/17/2019  . TETANUS/TDAP  02/27/2021  . PNA vac Low Risk Adult  Completed    Cancer Screenings:  Colorectal Screening: No longer required.   Mammogram: No longer required.   Bone Density: Completed 04/16/17. Results reflect OSTEOPOROSIS. Repeat every 2 years.   Lung Cancer Screening: (Low Dose CT Chest recommended if Age 86-80 years, 30 pack-year currently smoking OR have quit w/in 15years.) does not qualify.   Additional Screening:  Dental Screening: Recommended annual dental exams for proper oral hygiene   Community Resource Referral:  CRR required this visit?  No       Plan:  I have personally reviewed and addressed the Medicare Annual Wellness questionnaire and have noted the following in the patient's chart:  A. Medical and social history B. Use of alcohol, tobacco or illicit drugs  C. Current medications and supplements D. Functional ability and status E.  Nutritional status F.  Physical activity G. Advance directives H. List of other physicians I.  Hospitalizations, surgeries, and ER visits in previous 12 months J.  [place] such as hearing and vision if needed, cognitive and depression L. Referrals and appointments   In addition, I have reviewed and discussed with patient certain preventive protocols, quality metrics, and best practice recommendations. A written personalized care plan for preventive services as well as general preventive health recommendations were provided to patient.   Glendora Score, [place]  3/[id number]/[id number] Nurse Health Advisor    Nurse Notes: None.

## 2018-10-20 NOTE — Patient Instructions (Addendum)
Robin Ashley , Thank you for taking time to come for your Medicare Wellness Visit. I appreciate your ongoing commitment to your health goals. Please review the following plan we discussed and let me know if I can assist you in the future.   Screening recommendations/referrals: Colonoscopy: No longer required.  Mammogram: No longer required.  Bone Density: Up to date, due 04/2019 Recommended yearly ophthalmology/optometry visit for glaucoma screening and checkup Recommended yearly dental visit for hygiene and checkup  Vaccinations: Influenza vaccine: Up to date Pneumococcal vaccine: Completed series Tdap vaccine: Up to date, due 02/2021 Shingles vaccine: Pt declines today.     Advanced directives: Please bring a copy of your POA (Power of Attorney) and/or Living Will to your next appointment.   Conditions/risks identified: Recommend to decrease the amount of sodas consumed in a day and increase the amount of water to 6-8 8 oz glasses a day.   Next appointment: 01/08/19 with [name].    Preventive Care 36 Years and Older, Female Preventive care refers to lifestyle choices and visits with your health care provider that can promote health and wellness. What does preventive care include?  A yearly physical exam. This is also called an annual well check.  Dental exams once or twice a year.  Routine eye exams. Ask your health care provider how often you should have your eyes checked.  Personal lifestyle choices, including:  Daily care of your teeth and gums.  Regular physical activity.  Eating a healthy diet.  Avoiding tobacco and drug use.  Limiting alcohol use.  Practicing safe sex.  Taking low-dose aspirin every day.  Taking vitamin and mineral supplements as recommended by your health care provider. What happens during an annual well check? The services and screenings done by your health care provider during your annual well check will depend on your age,  overall health, lifestyle risk factors, and family history of disease. Counseling  Your health care provider may ask you questions about your:  Alcohol use.  Tobacco use.  Drug use.  Emotional well-being.  Home and relationship well-being.  Sexual activity.  Eating habits.  History of falls.  Memory and ability to understand (cognition).  Work and work Statistician.  Reproductive health. Screening  You may have the following tests or measurements:  Height, weight, and BMI.  Blood pressure.  Lipid and cholesterol levels. These may be checked every 5 years, or more frequently if you are over 59 years old.  Skin check.  Lung cancer screening. You may have this screening every year starting at age 4 if you have a 30-pack-year history of smoking and currently smoke or have quit within the past 15 years.  Fecal occult blood test (FOBT) of the stool. You may have this test every year starting at age 70.  Flexible sigmoidoscopy or colonoscopy. You may have a sigmoidoscopy every 5 years or a colonoscopy every 10 years starting at age 56.  Hepatitis C blood test.  Hepatitis B blood test.  Sexually transmitted disease (STD) testing.  Diabetes screening. This is done by checking your blood sugar (glucose) after you have not eaten for a while (fasting). You may have this done every 1-3 years.  Bone density scan. This is done to screen for osteoporosis. You may have this done starting at age 26.  Mammogram. This may be done every 1-2 years. Talk to your health care provider about how often you should have regular mammograms. Talk with your health care provider about your test results,  treatment options, and if necessary, the need for more tests. Vaccines  Your health care provider may recommend certain vaccines, such as:  Influenza vaccine. This is recommended every year.  Tetanus, diphtheria, and acellular pertussis (Tdap, Td) vaccine. You may need a Td booster every 10  years.  Zoster vaccine. You may need this after age 20.  Pneumococcal 13-valent conjugate (PCV13) vaccine. One dose is recommended after age 16.  Pneumococcal polysaccharide (PPSV23) vaccine. One dose is recommended after age 7. Talk to your health care provider about which screenings and vaccines you need and how often you need them. This information is not intended to replace advice given to you by your health care provider. Make sure you discuss any questions you have with your health care provider. Document Released: 06/16/2015 Document Revised: 02/07/2016 Document Reviewed: 03/21/2015 Elsevier Interactive Patient Education  2017 [place] Prevention in the Home Falls can cause injuries. They can happen to people of all ages. There are many things you can do to make your home safe and to help prevent falls. What can I do on the outside of my home?  Regularly fix the edges of walkways and driveways and fix any cracks.  Remove anything that might make you trip as you walk through a door, such as a raised step or threshold.  Trim any bushes or trees on the path to your home.  Use bright outdoor lighting.  Clear any walking paths of anything that might make someone trip, such as rocks or tools.  Regularly check to see if handrails are loose or broken. Make sure that both sides of any steps have handrails.  Any raised decks and porches should have guardrails on the edges.  Have any leaves, snow, or ice cleared regularly.  Use sand or salt on walking paths during winter.  Clean up any spills in your garage right away. This includes oil or grease spills. What can I do in the bathroom?  Use night lights.  Install grab bars by the toilet and in the tub and shower. Do not use towel bars as grab bars.  Use non-skid mats or decals in the tub or shower.  If you need to sit down in the shower, use a plastic, non-slip stool.  Keep the floor dry. Clean up any water that  spills on the floor as soon as it happens.  Remove soap buildup in the tub or shower regularly.  Attach bath mats securely with double-sided non-slip rug tape.  Do not have throw rugs and other things on the floor that can make you trip. What can I do in the bedroom?  Use night lights.  Make sure that you have a light by your bed that is easy to reach.  Do not use any sheets or blankets that are too big for your bed. They should not hang down onto the floor.  Have a firm chair that has side arms. You can use this for support while you get dressed.  Do not have throw rugs and other things on the floor that can make you trip. What can I do in the kitchen?  Clean up any spills right away.  Avoid walking on wet floors.  Keep items that you use a lot in easy-to-reach places.  If you need to reach something above you, use a strong step stool that has a grab bar.  Keep electrical cords out of the way.  Do not use floor polish or wax that makes floors  slippery. If you must use wax, use non-skid floor wax.  Do not have throw rugs and other things on the floor that can make you trip. What can I do with my stairs?  Do not leave any items on the stairs.  Make sure that there are handrails on both sides of the stairs and use them. Fix handrails that are broken or loose. Make sure that handrails are as long as the stairways.  Check any carpeting to make sure that it is firmly attached to the stairs. Fix any carpet that is loose or worn.  Avoid having throw rugs at the top or bottom of the stairs. If you do have throw rugs, attach them to the floor with carpet tape.  Make sure that you have a light switch at the top of the stairs and the bottom of the stairs. If you do not have them, ask someone to add them for you. What else can I do to help prevent falls?  Wear shoes that:  Do not have high heels.  Have rubber bottoms.  Are comfortable and fit you well.  Are closed at the  toe. Do not wear sandals.  If you use a stepladder:  Make sure that it is fully opened. Do not climb a closed stepladder.  Make sure that both sides of the stepladder are locked into place.  Ask someone to hold it for you, if possible.  Clearly mark and make sure that you can see:  Any grab bars or handrails.  First and last steps.  Where the edge of each step is.  Use tools that help you move around (mobility aids) if they are needed. These include:  Canes.  Walkers.  Scooters.  Crutches.  Turn on the lights when you go into a dark area. Replace any light bulbs as soon as they burn out.  Set up your furniture so you have a clear path. Avoid moving your furniture around.  If any of your floors are uneven, fix them.  If there are any pets around you, be aware of where they are.  Review your medicines with your doctor. Some medicines can make you feel dizzy. This can increase your chance of falling. Ask your doctor what other things that you can do to help prevent falls. This information is not intended to replace advice given to you by your health care provider. Make sure you discuss any questions you have with your health care provider. Document Released: 03/16/2009 Document Revised: 10/26/2015 Document Reviewed: 06/24/2014 Elsevier Interactive Patient Education  2017 [employer].

## 2018-10-21 DIAGNOSIS — E119 Type 2 diabetes mellitus without complications: Secondary | ICD-10-CM | POA: Diagnosis not present

## 2018-10-21 DIAGNOSIS — H5203 Hypermetropia, bilateral: Secondary | ICD-10-CM | POA: Diagnosis not present

## 2018-10-21 DIAGNOSIS — H43813 Vitreous degeneration, bilateral: Secondary | ICD-10-CM | POA: Diagnosis not present

## 2018-10-21 DIAGNOSIS — Z961 Presence of intraocular lens: Secondary | ICD-10-CM | POA: Diagnosis not present

## 2018-10-21 DIAGNOSIS — H52223 Regular astigmatism, bilateral: Secondary | ICD-10-CM | POA: Diagnosis not present

## 2018-10-21 DIAGNOSIS — H524 Presbyopia: Secondary | ICD-10-CM | POA: Diagnosis not present

## 2018-10-21 DIAGNOSIS — Z7984 Long term (current) use of oral hypoglycemic drugs: Secondary | ICD-10-CM | POA: Diagnosis not present

## 2018-10-21 LAB — HM DIABETES EYE EXAM

## 2018-11-02 ENCOUNTER — Encounter: Payer: Self-pay | Admitting: Physician Assistant

## 2018-11-15 ENCOUNTER — Other Ambulatory Visit: Payer: Self-pay | Admitting: Physician Assistant

## 2018-11-15 DIAGNOSIS — E7800 Pure hypercholesterolemia, unspecified: Secondary | ICD-10-CM

## 2018-11-15 DIAGNOSIS — E1142 Type 2 diabetes mellitus with diabetic polyneuropathy: Secondary | ICD-10-CM

## 2018-12-22 ENCOUNTER — Encounter: Payer: Self-pay | Admitting: General Surgery

## 2019-01-07 ENCOUNTER — Telehealth: Payer: Self-pay

## 2019-01-07 NOTE — Telephone Encounter (Signed)
Prescreen completed.

## 2019-01-07 NOTE — Progress Notes (Signed)
Patient: Robin Ashley Female    DOB: Mar 28, 1932   85 y.o.   MRN: [id number] Visit Date: 01/08/2019  Today's Provider: Mar Daring, PA-C   Chief Complaint  Patient presents with  . Follow-up  . Diabetes   Subjective:     HPI   Diabetes Mellitus Type II, Follow-up:   Lab Results  Component Value Date   HGBA1C 6.4 (A) 10/07/2018   HGBA1C 6.2 (A) 07/08/2018   HGBA1C 6.3 (A) 04/06/2018    Last seen for diabetes 3 months ago.  Management since then includes none.Patient stable. She reports fair compliance with treatment. She is not having side effects. none Current symptoms include none and have been unchanged. Home blood sugar records: fasting range: not checking  Episodes of hypoglycemia? no    No Known Allergies   Current Outpatient Medications:  .  alendronate (FOSAMAX) 70 MG tablet, TAKE 1 TABLET EVERY 7 DAYS WITH A FULL GLASS OF WATER ON AN EMPTY STOMACH, Disp: 12 tablet, Rfl: 4 .  amLODipine (NORVASC) 5 MG tablet, , Disp: , Rfl:  .  amoxicillin-clavulanate (AUGMENTIN) 875-125 MG tablet, amoxicillin 875 mg-potassium clavulanate 125 mg tablet  TAKE 1 TABLET BY MOUTH TWICE A DAY FOR 10 DAYS, Disp: , Rfl:  .  aspirin 81 MG tablet, Take 1 tablet (81 mg total) by mouth daily., Disp: 30 tablet, Rfl: 0 .  atorvastatin (LIPITOR) 80 MG tablet, TAKE 1 TABLET DAILY, Disp: 90 tablet, Rfl: 3 .  ferrous sulfate 325 (65 FE) MG tablet, Iron (ferrous sulfate) 325 mg (65 mg iron) tablet  TAKE 1 TABLET BY MOUTH TWICE A DAY, Disp: , Rfl:  .  furosemide (LASIX) 20 MG tablet, TAKE 1 TABLET DAILY, Disp: 90 tablet, Rfl: 4 .  HYDROcodone-acetaminophen (NORCO/VICODIN) 5-325 MG tablet, Take 1-2 tablets by mouth every 4 (four) hours as needed for moderate pain. (Patient not taking: Reported on 10/20/2018), Disp: 30 tablet, Rfl: 0 .  letrozole (FEMARA) 2.5 MG tablet, TAKE 1 TABLET DAILY, Disp: 90 tablet, Rfl: 4 .  lisinopril (PRINIVIL,ZESTRIL) 20 MG tablet, , Disp: , Rfl:  .   meloxicam (MOBIC) 15 MG tablet, TAKE 1 TABLET DAILY, Disp: 90 tablet, Rfl: 4 .  metFORMIN (GLUCOPHAGE) 500 MG tablet, TAKE 1 TABLET TWICE A DAY, Disp: 180 tablet, Rfl: 3 .  methylPREDNISolone (MEDROL DOSEPAK) 4 MG TBPK tablet, methylprednisolone 4 mg tablets in a dose pack  TAKE AS DIRECTED, Disp: , Rfl:  .  methylPREDNISolone (MEDROL DOSEPAK) 4 MG TBPK tablet, methylprednisolone 4 mg tablets in a dose pack  TAKE AS DIRECTED, Disp: , Rfl:  .  Multiple Vitamins-Minerals (CENTRUM SILVER PO), Take 1 tablet by mouth daily., Disp: , Rfl:  .  omeprazole (PRILOSEC) 20 MG capsule, TAKE 1 CAPSULE DAILY, Disp: 90 capsule, Rfl: 4 .  Omeprazole 20 MG TBEC, omeprazole 20 mg capsule,delayed release, Disp: , Rfl:  .  oxyCODONE (OXY IR/ROXICODONE) 5 MG immediate release tablet, oxycodone 5 mg tablet  TAKE 1 TABLET BY MOUTH EVERY 4 HOURS, Disp: , Rfl:  .  polyethylene glycol powder (GLYCOLAX/MIRALAX) powder, Take by mouth daily. , Disp: , Rfl: 0 .  traZODone (DESYREL) 50 MG tablet, TAKE 1 TABLET EVERY EVENING, Disp: 90 tablet, Rfl: 3  Review of Systems  Constitutional: Negative for appetite change, chills, fatigue and fever.  Respiratory: Negative for chest tightness and shortness of breath.   Cardiovascular: Negative for chest pain and palpitations.  Gastrointestinal: Negative for abdominal pain, nausea and vomiting.  Neurological: Negative for dizziness and weakness.    Social History   Tobacco Use  . Smoking status: Never Smoker  . Smokeless tobacco: Never Used  Substance Use Topics  . Alcohol use: No      Objective:   BP (!) 156/84 (BP Location: Right Arm, Patient Position: Sitting, Cuff Size: Large)   Pulse 95   Temp 97.9 F (36.6 C) (Oral)   Resp 16   Ht 5\' 6"  (1.676 m)   Wt 207 lb (93.9 kg)   SpO2 96%   BMI 33.41 kg/m  Vitals:   01/08/19 0824  BP: (!) 156/84  Pulse: 95  Resp: 16  Temp: 97.9 F (36.6 C)  TempSrc: Oral  SpO2: 96%  Weight: 207 lb (93.9 kg)  Height: 5\' 6"  (1.676  m)     Physical Exam Vitals signs reviewed.  Constitutional:      General: She is not in acute distress.    Appearance: Normal appearance. She is well-developed. She is obese. She is not ill-appearing or diaphoretic.  Neck:     Musculoskeletal: Normal range of motion and neck supple.  Cardiovascular:     Rate and Rhythm: Normal rate and regular rhythm.     Heart sounds: Normal heart sounds. No murmur. No friction rub. No gallop.   Pulmonary:     Effort: Pulmonary effort is normal. No respiratory distress.     Breath sounds: Normal breath sounds. No wheezing or rales.  Neurological:     Mental Status: She is alert.      No results found for any visits on 01/08/19.     Assessment & Plan    1. Benign hypertension with CKD (chronic kidney disease) stage III (HCC) Stable. Continue current medical treatment plan. Will check labs as below and f/u pending results. I will see her back in 3 months for blood pressure recheck and A1c.  - CBC w/Diff/Platelet - Comprehensive Metabolic Panel (CMET) - Lipid Profile - HgB A1c  2. Type 2 diabetes mellitus with stage 3 chronic kidney disease, without long-term current use of insulin (HCC) Previously 6.2. Recheck today. Continue metformin 1000mg  BID. Will check labs as below and f/u pending results. - CBC w/Diff/Platelet - Comprehensive Metabolic Panel (CMET) - Lipid Profile - HgB A1c  3. Hypercholesteremia Stable on Atorvastatin 80 mg. Will check labs as below and f/u pending results. - CBC w/Diff/Platelet - Comprehensive Metabolic Panel (CMET) - Lipid Profile - HgB A1c     Mar Daring, PA-C  [facility]

## 2019-01-08 ENCOUNTER — Ambulatory Visit (INDEPENDENT_AMBULATORY_CARE_PROVIDER_SITE_OTHER): Payer: Medicare Other | Admitting: Physician Assistant

## 2019-01-08 ENCOUNTER — Encounter: Payer: Self-pay | Admitting: Physician Assistant

## 2019-01-08 ENCOUNTER — Other Ambulatory Visit: Payer: Self-pay

## 2019-01-08 VITALS — BP 156/84 | HR 95 | Temp 97.9°F | Resp 16 | Ht 66.0 in | Wt 207.0 lb

## 2019-01-08 DIAGNOSIS — E1122 Type 2 diabetes mellitus with diabetic chronic kidney disease: Secondary | ICD-10-CM

## 2019-01-08 DIAGNOSIS — E7800 Pure hypercholesterolemia, unspecified: Secondary | ICD-10-CM | POA: Diagnosis not present

## 2019-01-08 DIAGNOSIS — I129 Hypertensive chronic kidney disease with stage 1 through stage 4 chronic kidney disease, or unspecified chronic kidney disease: Secondary | ICD-10-CM

## 2019-01-08 DIAGNOSIS — N183 Chronic kidney disease, stage 3 (moderate): Secondary | ICD-10-CM

## 2019-01-08 NOTE — Patient Instructions (Signed)
Health Maintenance After Age 83 After age 83, you are at a higher risk for certain long-term diseases and infections as well as injuries from falls. Falls are a major cause of broken bones and head injuries in people who are older than age 83. Getting regular preventive care can help to keep you healthy and well. Preventive care includes getting regular testing and making lifestyle changes as recommended by your health care provider. Talk with your health care provider about:  Which screenings and tests you should have. A screening is a test that checks for a disease when you have no symptoms.  A diet and exercise plan that is right for you. What should I know about screenings and tests to prevent falls? Screening and testing are the best ways to find a health problem early. Early diagnosis and treatment give you the best chance of managing medical conditions that are common after age 83. Certain conditions and lifestyle choices may make you more likely to have a fall. Your health care provider may recommend:  Regular vision checks. Poor vision and conditions such as cataracts can make you more likely to have a fall. If you wear glasses, make sure to get your prescription updated if your vision changes.  Medicine review. Work with your health care provider to regularly review all of the medicines you are taking, including over-the-counter medicines. Ask your health care provider about any side effects that may make you more likely to have a fall. Tell your health care provider if any medicines that you take make you feel dizzy or sleepy.  Osteoporosis screening. Osteoporosis is a condition that causes the bones to get weaker. This can make the bones weak and cause them to break more easily.  Blood pressure screening. Blood pressure changes and medicines to control blood pressure can make you feel dizzy.  Strength and balance checks. Your health care provider may recommend certain tests to check your  strength and balance while standing, walking, or changing positions.  Foot health exam. Foot pain and numbness, as well as not wearing proper footwear, can make you more likely to have a fall.  Depression screening. You may be more likely to have a fall if you have a fear of falling, feel emotionally low, or feel unable to do activities that you used to do.  Alcohol use screening. Using too much alcohol can affect your balance and may make you more likely to have a fall. What actions can I take to lower my risk of falls? General instructions  Talk with your health care provider about your risks for falling. Tell your health care provider if: ? You fall. Be sure to tell your health care provider about all falls, even ones that seem minor. ? You feel dizzy, sleepy, or off-balance.  Take over-the-counter and prescription medicines only as told by your health care provider. These include any supplements.  Eat a healthy diet and maintain a healthy weight. A healthy diet includes low-fat dairy products, low-fat (lean) meats, and fiber from whole grains, beans, and lots of fruits and vegetables. Home safety  Remove any tripping hazards, such as rugs, cords, and clutter.  Install safety equipment such as grab bars in bathrooms and safety rails on stairs.  Keep rooms and walkways well-lit. Activity   Follow a regular exercise program to stay fit. This will help you maintain your balance. Ask your health care provider what types of exercise are appropriate for you.  If you need a cane or   walker, use it as recommended by your health care provider.  Wear supportive shoes that have nonskid soles. Lifestyle  Do not drink alcohol if your health care provider tells you not to drink.  If you drink alcohol, limit how much you have: ? 0-1 drink a day for women. ? 0-2 drinks a day for men.  Be aware of how much alcohol is in your drink. In the [place] one drink equals one typical bottle of beer (12  oz), one-half glass of wine (5 oz), or one shot of hard liquor (1 oz).  Do not use any products that contain nicotine or tobacco, such as cigarettes and e-cigarettes. If you need help quitting, ask your health care provider. Summary  Having a healthy lifestyle and getting preventive care can help to protect your health and wellness after age 83.  Screening and testing are the best way to find a health problem early and help you avoid having a fall. Early diagnosis and treatment give you the best chance for managing medical conditions that are more common for people who are older than age 83.  Falls are a major cause of broken bones and head injuries in people who are older than age 83. Take precautions to prevent a fall at home.  Work with your health care provider to learn what changes you can make to improve your health and wellness and to prevent falls. This information is not intended to replace advice given to you by your health care provider. Make sure you discuss any questions you have with your health care provider. Document Released: 04/02/2017 Document Revised: 09/10/2018 Document Reviewed: 04/02/2017 Elsevier Patient Education  2020 [employer]  

## 2019-01-09 LAB — COMPREHENSIVE METABOLIC PANEL
ALT: 7 IU/L (ref 0–32)
AST: 14 IU/L (ref 0–40)
Albumin/Globulin Ratio: 1.7 (ref 1.2–2.2)
Albumin: 4.1 g/dL (ref 3.6–4.6)
Alkaline Phosphatase: 117 IU/L (ref 39–117)
BUN/Creatinine Ratio: 9 — ABNORMAL LOW (ref 12–28)
BUN: 12 mg/dL (ref 8–27)
Bilirubin Total: 0.3 mg/dL (ref 0.0–1.2)
CO2: 21 mmol/L (ref 20–29)
Calcium: 9.2 mg/dL (ref 8.7–10.3)
Chloride: 100 mmol/L (ref 96–106)
Creatinine, Ser: 1.34 mg/dL — ABNORMAL HIGH (ref 0.57–1.00)
GFR calc Af Amer: 42 mL/min/{1.73_m2} — ABNORMAL LOW (ref >59)
GFR calc non Af Amer: 36 mL/min/{1.73_m2} — ABNORMAL LOW (ref >59)
Globulin, Total: 2.4 g/dL (ref 1.5–4.5)
Glucose: 106 mg/dL — ABNORMAL HIGH (ref 65–99)
Potassium: 4.4 mmol/L (ref 3.5–5.2)
Sodium: 136 mmol/L (ref 134–144)
Total Protein: 6.5 g/dL (ref 6.0–8.5)

## 2019-01-09 LAB — CBC WITH DIFFERENTIAL/PLATELET
Basophils Absolute: 0.1 10*3/uL (ref 0.0–0.2)
Basos: 1 %
EOS (ABSOLUTE): 0.2 10*3/uL (ref 0.0–0.4)
Eos: 2 %
Hematocrit: 32.9 % — ABNORMAL LOW (ref 34.0–46.6)
Hemoglobin: 11.2 g/dL (ref 11.1–15.9)
Immature Grans (Abs): 0 10*3/uL (ref 0.0–0.1)
Immature Granulocytes: 1 %
Lymphocytes Absolute: 1.8 10*3/uL (ref 0.7–3.1)
Lymphs: 21 %
MCH: 30.6 pg (ref 26.6–33.0)
MCHC: 34 g/dL (ref 31.5–35.7)
MCV: 90 fL (ref 79–97)
Monocytes Absolute: 0.8 10*3/uL (ref 0.1–0.9)
Monocytes: 9 %
Neutrophils Absolute: 5.5 10*3/uL (ref 1.4–7.0)
Neutrophils: 66 %
Platelets: 350 10*3/uL (ref 150–450)
RBC: 3.66 x10E6/uL — ABNORMAL LOW (ref 3.77–5.28)
RDW: 12.8 % (ref 11.7–15.4)
WBC: 8.3 10*3/uL (ref 3.4–10.8)

## 2019-01-09 LAB — LIPID PANEL
Chol/HDL Ratio: 2.8 ratio (ref 0.0–4.4)
Cholesterol, Total: 127 mg/dL (ref 100–199)
HDL: 46 mg/dL (ref >39)
LDL Calculated: 54 mg/dL (ref 0–99)
Triglycerides: 135 mg/dL (ref 0–149)
VLDL Cholesterol Cal: 27 mg/dL (ref 5–40)

## 2019-01-09 LAB — HEMOGLOBIN A1C
Est. average glucose Bld gHb Est-mCnc: 131 mg/dL
Hgb A1c MFr Bld: 6.2 % — ABNORMAL HIGH (ref 4.8–5.6)

## 2019-01-10 ENCOUNTER — Other Ambulatory Visit: Payer: Self-pay | Admitting: Physician Assistant

## 2019-01-10 DIAGNOSIS — G4700 Insomnia, unspecified: Secondary | ICD-10-CM

## 2019-01-12 ENCOUNTER — Telehealth: Payer: Self-pay

## 2019-01-12 NOTE — Telephone Encounter (Signed)
-----   Message from Mar Daring, PA-C sent at 01/12/2019  1:12 PM EDT ----- Blood count is normal and stable. Kidney function decreased just slightly, most likely from dehydration for fasting for labs. Recommend pushing fluids. Can recheck in 2 weeks if patient desires. Liver function normal. Sodium, potassium and calcium are normal. Cholesterol normal. A1c is down to 6.2! Was 6.4 last time.

## 2019-01-12 NOTE — Telephone Encounter (Signed)
Patient advised as directed below. 

## 2019-01-27 ENCOUNTER — Other Ambulatory Visit: Payer: Self-pay

## 2019-01-27 DIAGNOSIS — Z1231 Encounter for screening mammogram for malignant neoplasm of breast: Secondary | ICD-10-CM

## 2019-03-19 ENCOUNTER — Ambulatory Visit: Payer: Medicare Other | Admitting: Surgery

## 2019-03-24 ENCOUNTER — Ambulatory Visit: Payer: Medicare Other | Admitting: Surgery

## 2019-04-06 NOTE — Progress Notes (Signed)
Patient: Robin Ashley Female    DOB: [birth year]   84 y.o.   MRN: [id number] Visit Date: 04/12/2019  Today's Provider: Mar Daring, PA-C   Chief Complaint  Patient presents with   Diabetes   Hypertension   Subjective:     HPI  Benign hypertension with CKD (chronic kidney disease) stage III:  BP Readings from Last 3 Encounters:  04/12/19 140/76  01/08/19 (!) 156/84  10/07/18 (!) 147/82    She was last seen for hypertension 3 months ago.  BP at that visit was 156/84. Management since that visit includes Stable. Continue current medical treatment plan. She reports excellent compliance with treatment. She is not having side effects.  She is not exercising. She is adherent to low salt diet.   Outside blood pressures are not being checked. She is experiencing none.  Patient denies chest pain, chest pressure/discomfort, claudication, dyspnea, exertional chest pressure/discomfort, fatigue, irregular heart beat, lower extremity edema, near-syncope, orthopnea, palpitations, paroxysmal nocturnal dyspnea, syncope and tachypnea.   Cardiovascular risk factors include advanced age (older than 23 for men, 72 for women), diabetes mellitus and hypertension.    Weight trend: stable Wt Readings from Last 3 Encounters:  04/12/19 212 lb (96.2 kg)  01/08/19 207 lb (93.9 kg)  10/07/18 207 lb (93.9 kg)    Current diet: well balanced  ------------------------------------------------------------------------  Diabetes Mellitus Type II with stage 3 CKD, Follow-up:   Lab Results  Component Value Date   HGBA1C 5.6 04/12/2019   HGBA1C 6.2 (H) 01/08/2019   HGBA1C 6.4 (A) 10/07/2018    Last seen for diabetes 3 months ago.  Management since then includes Continue metformin 1000mg  BID. She reports excellent compliance with treatment. She is not having side effects.  Current symptoms include none and have been unchanged. Home blood sugar records: are not being  checked  Episodes of hypoglycemia? unknown   Current insulin regiment: Is not on insulin Most Recent Eye Exam: 10/21/18 Weight trend: stable Current exercise: none Current diet habits: well balanced  Pertinent Labs:    Component Value Date/Time   CHOL 127 01/08/2019 0904   TRIG 135 01/08/2019 0904   HDL 46 01/08/2019 0904   LDLCALC 54 01/08/2019 0904   CREATININE 1.34 (H) 01/08/2019 0904   CREATININE 1.13 03/09/2013 1247    Wt Readings from Last 3 Encounters:  04/12/19 212 lb (96.2 kg)  01/08/19 207 lb (93.9 kg)  10/07/18 207 lb (93.9 kg)   ------------------------------------------------------------------------   No Known Allergies   Current Outpatient Medications:    alendronate (FOSAMAX) 70 MG tablet, TAKE 1 TABLET EVERY 7 DAYS WITH A FULL GLASS OF WATER ON AN EMPTY STOMACH, Disp: 12 tablet, Rfl: 4   amLODipine (NORVASC) 5 MG tablet, , Disp: , Rfl:    aspirin 81 MG tablet, Take 1 tablet (81 mg total) by mouth daily., Disp: 30 tablet, Rfl: 0   atorvastatin (LIPITOR) 80 MG tablet, TAKE 1 TABLET DAILY, Disp: 90 tablet, Rfl: 3   ferrous sulfate 325 (65 FE) MG tablet, Iron (ferrous sulfate) 325 mg (65 mg iron) tablet  TAKE 1 TABLET BY MOUTH TWICE A DAY, Disp: , Rfl:    furosemide (LASIX) 20 MG tablet, TAKE 1 TABLET DAILY, Disp: 90 tablet, Rfl: 4   letrozole (FEMARA) 2.5 MG tablet, TAKE 1 TABLET DAILY, Disp: 90 tablet, Rfl: 4   lisinopril (PRINIVIL,ZESTRIL) 20 MG tablet, , Disp: , Rfl:    meloxicam (MOBIC) 15 MG tablet, TAKE 1 TABLET DAILY,  Disp: 90 tablet, Rfl: 4   metFORMIN (GLUCOPHAGE) 500 MG tablet, TAKE 1 TABLET TWICE A DAY, Disp: 180 tablet, Rfl: 3   methylPREDNISolone (MEDROL DOSEPAK) 4 MG TBPK tablet, methylprednisolone 4 mg tablets in a dose pack  TAKE AS DIRECTED, Disp: , Rfl:    methylPREDNISolone (MEDROL DOSEPAK) 4 MG TBPK tablet, methylprednisolone 4 mg tablets in a dose pack  TAKE AS DIRECTED, Disp: , Rfl:    Multiple Vitamins-Minerals (CENTRUM  SILVER PO), Take 1 tablet by mouth daily., Disp: , Rfl:    omeprazole (PRILOSEC) 20 MG capsule, TAKE 1 CAPSULE DAILY, Disp: 90 capsule, Rfl: 4   Omeprazole 20 MG TBEC, omeprazole 20 mg capsule,delayed release, Disp: , Rfl:    oxyCODONE (OXY IR/ROXICODONE) 5 MG immediate release tablet, oxycodone 5 mg tablet  TAKE 1 TABLET BY MOUTH EVERY 4 HOURS, Disp: , Rfl:    polyethylene glycol powder (GLYCOLAX/MIRALAX) powder, Take by mouth daily. , Disp: , Rfl: 0   traZODone (DESYREL) 50 MG tablet, TAKE 1 TABLET EVERY EVENING, Disp: 90 tablet, Rfl: 3   HYDROcodone-acetaminophen (NORCO/VICODIN) 5-325 MG tablet, Take 1-2 tablets by mouth every 4 (four) hours as needed for moderate pain. (Patient not taking: Reported on 10/20/2018), Disp: 30 tablet, Rfl: 0  Review of Systems  Constitutional: Negative.   Respiratory: Negative.   Cardiovascular: Negative.   Endocrine: Negative.   Neurological: Negative.     Social History   Tobacco Use   Smoking status: Never Smoker   Smokeless tobacco: Never Used  Substance Use Topics   Alcohol use: No      Objective:   BP 140/76    Pulse 95    Temp (!) 96.9 F (36.1 C) (Oral)    Resp 16    Wt 212 lb (96.2 kg)    SpO2 98%    BMI 34.22 kg/m  Vitals:   04/12/19 0824  BP: 140/76  Pulse: 95  Resp: 16  Temp: (!) 96.9 F (36.1 C)  TempSrc: Oral  SpO2: 98%  Weight: 212 lb (96.2 kg)  Body mass index is 34.22 kg/m.   Physical Exam Vitals signs reviewed.  Constitutional:      General: She is not in acute distress.    Appearance: She is well-developed. She is obese. She is not ill-appearing or diaphoretic.  Neck:     Musculoskeletal: Normal range of motion and neck supple.  Cardiovascular:     Rate and Rhythm: Normal rate and regular rhythm.     Heart sounds: Normal heart sounds. No murmur. No friction rub. No gallop.   Pulmonary:     Effort: Pulmonary effort is normal. No respiratory distress.     Breath sounds: Normal breath sounds. No wheezing  or rales.  Musculoskeletal:     Right lower leg: No edema.     Left lower leg: No edema.  Neurological:     Mental Status: She is alert.    Diabetic Foot Exam - Simple   Simple Foot Form Diabetic Foot exam was performed with the following findings: Yes 04/12/2019  1:08 PM  Visual Inspection No deformities, no ulcerations, no other skin breakdown bilaterally: Yes Sensation Testing Intact to touch and monofilament testing bilaterally: Yes Pulse Check Posterior Tibialis and Dorsalis pulse intact bilaterally: Yes Comments      Results for orders placed or performed in visit on 04/12/19  POCT HgB A1C  Result Value Ref Range   Hemoglobin A1C 5.6 4.0 - 5.6 %   HbA1c POC (<> result, manual  entry)     HbA1c, POC (prediabetic range)     HbA1c, POC (controlled diabetic range)         Assessment & Plan    1. Type 2 diabetes mellitus with stage 3 chronic kidney disease, without long-term current use of insulin, unspecified whether stage 3a or 3b CKD (HCC) Stable. A1c today was 5.6. Will decrease metformin 500mg  to daily instead of BID. Patient agrees. Continue lisinopril. Will return in 3 months for next A1c check.   2. Type 2 diabetes mellitus with diabetic polyneuropathy, without long-term current use of insulin ([place]) See above medical treatment plan. - metFORMIN (GLUCOPHAGE) 500 MG tablet; Take 1 tablet (500 mg total) by mouth daily with breakfast.  Dispense: 90 tablet; Refill: 3  3. Benign hypertension with CKD (chronic kidney disease) stage III Stable. Continue amlodipine 5mg , lisinopril 20mg .  4. Need for influenza vaccination Flu vaccine given today without complication. Patient sat upright for 15 minutes to check for adverse reaction before being released. - Flu Vaccine QUAD High Dose(Fluad)    Mar Daring, PA-C  [facility] Patient seen and examined by [name], PA, note scribed by [employer], [place]

## 2019-04-12 ENCOUNTER — Other Ambulatory Visit: Payer: Self-pay

## 2019-04-12 ENCOUNTER — Encounter: Payer: Self-pay | Admitting: Physician Assistant

## 2019-04-12 ENCOUNTER — Ambulatory Visit (INDEPENDENT_AMBULATORY_CARE_PROVIDER_SITE_OTHER): Payer: Medicare Other | Admitting: Physician Assistant

## 2019-04-12 VITALS — BP 140/76 | HR 95 | Temp 96.9°F | Resp 16 | Wt 212.0 lb

## 2019-04-12 DIAGNOSIS — Z23 Encounter for immunization: Secondary | ICD-10-CM | POA: Diagnosis not present

## 2019-04-12 DIAGNOSIS — N183 Chronic kidney disease, stage 3 unspecified: Secondary | ICD-10-CM | POA: Diagnosis not present

## 2019-04-12 DIAGNOSIS — E1122 Type 2 diabetes mellitus with diabetic chronic kidney disease: Secondary | ICD-10-CM | POA: Diagnosis not present

## 2019-04-12 DIAGNOSIS — I129 Hypertensive chronic kidney disease with stage 1 through stage 4 chronic kidney disease, or unspecified chronic kidney disease: Secondary | ICD-10-CM

## 2019-04-12 DIAGNOSIS — E1142 Type 2 diabetes mellitus with diabetic polyneuropathy: Secondary | ICD-10-CM | POA: Diagnosis not present

## 2019-04-12 LAB — POCT GLYCOSYLATED HEMOGLOBIN (HGB A1C): Hemoglobin A1C: 5.6 % (ref 4.0–5.6)

## 2019-04-12 MED ORDER — METFORMIN HCL 500 MG PO TABS: 500.0000 mg | ORAL_TABLET | Freq: Every day | ORAL | 3 refills | Status: DC

## 2019-04-12 NOTE — Patient Instructions (Signed)

## 2019-05-05 ENCOUNTER — Other Ambulatory Visit: Payer: Self-pay | Admitting: Physician Assistant

## 2019-05-05 DIAGNOSIS — C50912 Malignant neoplasm of unspecified site of left female breast: Secondary | ICD-10-CM

## 2019-05-05 MED ORDER — MELOXICAM 15 MG PO TABS: 15.0000 mg | ORAL_TABLET | Freq: Every day | ORAL | 4 refills | Status: DC

## 2019-05-05 MED ORDER — LETROZOLE 2.5 MG PO TABS: 2.5000 mg | ORAL_TABLET | Freq: Every day | ORAL | 4 refills | Status: DC

## 2019-05-05 NOTE — Telephone Encounter (Signed)
[facility] faxed refill request for the following medications:  letrozole (FEMARA) 2.5 MG tablet  meloxicam (MOBIC) 15 MG tablet   Please advise.

## 2019-06-22 ENCOUNTER — Encounter: Payer: Self-pay | Admitting: *Deleted

## 2019-07-01 NOTE — Progress Notes (Signed)
Patient: Robin Ashley Female    DOB: 07/17/1931   86 y.o.   MRN: [id number] Visit Date: 07/05/2019  Today's Provider: Mar Daring, PA-C   Chief Complaint  Patient presents with  . Follow-up    T2DM   Subjective:     HPI   Diabetes Mellitus Type II, Follow-up:   Lab Results  Component Value Date   HGBA1C 5.7 (A) 07/05/2019   HGBA1C 5.6 04/12/2019   HGBA1C 6.2 (H) 01/08/2019    Last seen for diabetes 3 months ago.  Management since then includes decrease metformin 500mg  to daily instead of BID.Continue lisinopril. She reports excellent compliance with treatment. She is not having side effects.  Current symptoms include none and have been stable. Home blood sugar records: not cheking  Episodes of hypoglycemia? n/a   Current insulin regiment: Is not on insulin Weight trend: stable   Pertinent Labs:    Component Value Date/Time   CHOL 127 01/08/2019 0904   TRIG 135 01/08/2019 0904   HDL 46 01/08/2019 0904   LDLCALC 54 01/08/2019 0904   CREATININE 1.34 (H) 01/08/2019 0904   CREATININE 1.13 03/09/2013 1247    Wt Readings from Last 3 Encounters:  07/05/19 214 lb (97.1 kg)  04/12/19 212 lb (96.2 kg)  01/08/19 207 lb (93.9 kg)   ------------------------------------------------------------------------   No Known Allergies   Current Outpatient Medications:  .  alendronate (FOSAMAX) 70 MG tablet, TAKE 1 TABLET EVERY 7 DAYS WITH A FULL GLASS OF WATER ON AN EMPTY STOMACH, Disp: 12 tablet, Rfl: 4 .  amLODipine (NORVASC) 5 MG tablet, , Disp: , Rfl:  .  aspirin 81 MG tablet, Take 1 tablet (81 mg total) by mouth daily., Disp: 30 tablet, Rfl: 0 .  atorvastatin (LIPITOR) 80 MG tablet, TAKE 1 TABLET DAILY, Disp: 90 tablet, Rfl: 3 .  ferrous sulfate 325 (65 FE) MG tablet, Iron (ferrous sulfate) 325 mg (65 mg iron) tablet  TAKE 1 TABLET BY MOUTH TWICE A DAY, Disp: , Rfl:  .  furosemide (LASIX) 20 MG tablet, TAKE 1 TABLET DAILY, Disp: 90 tablet, Rfl: 4 .   letrozole (FEMARA) 2.5 MG tablet, Take 1 tablet (2.5 mg total) by mouth daily., Disp: 90 tablet, Rfl: 4 .  lisinopril (PRINIVIL,ZESTRIL) 20 MG tablet, , Disp: , Rfl:  .  meloxicam (MOBIC) 15 MG tablet, Take 1 tablet (15 mg total) by mouth daily., Disp: 90 tablet, Rfl: 4 .  metFORMIN (GLUCOPHAGE) 500 MG tablet, Take 1 tablet (500 mg total) by mouth daily with breakfast., Disp: 90 tablet, Rfl: 3 .  Multiple Vitamins-Minerals (CENTRUM SILVER PO), Take 1 tablet by mouth daily., Disp: , Rfl:  .  omeprazole (PRILOSEC) 20 MG capsule, TAKE 1 CAPSULE DAILY, Disp: 90 capsule, Rfl: 4 .  Omeprazole 20 MG TBEC, omeprazole 20 mg capsule,delayed release, Disp: , Rfl:  .  oxyCODONE (OXY IR/ROXICODONE) 5 MG immediate release tablet, oxycodone 5 mg tablet  TAKE 1 TABLET BY MOUTH EVERY 4 HOURS, Disp: , Rfl:  .  polyethylene glycol powder (GLYCOLAX/MIRALAX) powder, Take by mouth daily. , Disp: , Rfl: 0 .  traZODone (DESYREL) 50 MG tablet, TAKE 1 TABLET EVERY EVENING, Disp: 90 tablet, Rfl: 3  Review of Systems  Constitutional: Negative for appetite change, chills, fatigue and fever.  Eyes: Negative for visual disturbance.  Respiratory: Negative for chest tightness and shortness of breath.   Cardiovascular: Positive for leg swelling (chronic; not worse). Negative for chest pain and palpitations.  Gastrointestinal: Negative for abdominal pain, nausea and vomiting.  Neurological: Negative for dizziness and weakness.    Social History   Tobacco Use  . Smoking status: Never Smoker  . Smokeless tobacco: Never Used  Substance Use Topics  . Alcohol use: No      Objective:   BP (!) 180/92 (BP Location: Left Arm, Patient Position: Sitting, Cuff Size: Large)   Pulse (!) 103   Temp (!) 97.3 F (36.3 C) (Temporal)   Resp 16   Wt 214 lb (97.1 kg)   BMI 34.54 kg/m  Vitals:   07/05/19 0810  BP: (!) 180/92  Pulse: (!) 103  Resp: 16  Temp: (!) 97.3 F (36.3 C)  TempSrc: Temporal  Weight: 214 lb (97.1 kg)    Body mass index is 34.54 kg/m.   Physical Exam Vitals reviewed.  Constitutional:      General: She is not in acute distress.    Appearance: Normal appearance. She is well-developed. She is obese. She is not ill-appearing or diaphoretic.  Neck:     Thyroid: No thyromegaly.     Vascular: No JVD.     Trachea: No tracheal deviation.  Cardiovascular:     Rate and Rhythm: Normal rate and regular rhythm.     Pulses: Normal pulses.     Heart sounds: Normal heart sounds. No murmur. No friction rub. No gallop.   Pulmonary:     Effort: Pulmonary effort is normal. No respiratory distress.     Breath sounds: Normal breath sounds. No wheezing or rales.  Musculoskeletal:     Cervical back: Normal range of motion and neck supple.     Right lower leg: Edema (1+ pitting ) present.     Left lower leg: Edema (1+ pitting) present.  Lymphadenopathy:     Cervical: No cervical adenopathy.  Neurological:     General: No focal deficit present.     Mental Status: She is alert. Mental status is at baseline.      Results for orders placed or performed in visit on 07/05/19  POCT glycosylated hemoglobin (Hb A1C)  Result Value Ref Range   Hemoglobin A1C 5.7 (A) 4.0 - 5.6 %   Est. average glucose Bld gHb Est-mCnc 117        Assessment & Plan    1. Type 2 diabetes mellitus with stage 3 chronic kidney disease, without long-term current use of insulin, unspecified whether stage 3a or 3b CKD (HCC) Stable. Continue Metformin 500mg  daily.   2. Benign hypertension with CKD (chronic kidney disease) stage III Elevated today. Increased lisinopril to 40mg . Continue Amlodipine 5mg .  - lisinopril (ZESTRIL) 40 MG tablet; Take 1 tablet (40 mg total) by mouth daily.  Dispense: 90 tablet; Refill: 3  3. Bilateral lower extremity edema Secondary to dependent positioning. Advised to elevate legs and walk intermittently.     Mar Daring, PA-C  [facility]

## 2019-07-05 ENCOUNTER — Ambulatory Visit (INDEPENDENT_AMBULATORY_CARE_PROVIDER_SITE_OTHER): Payer: Medicare Other | Admitting: Physician Assistant

## 2019-07-05 ENCOUNTER — Other Ambulatory Visit: Payer: Self-pay

## 2019-07-05 ENCOUNTER — Encounter: Payer: Self-pay | Admitting: Physician Assistant

## 2019-07-05 VITALS — BP 180/92 | HR 103 | Temp 97.3°F | Resp 16 | Wt 214.0 lb

## 2019-07-05 DIAGNOSIS — I129 Hypertensive chronic kidney disease with stage 1 through stage 4 chronic kidney disease, or unspecified chronic kidney disease: Secondary | ICD-10-CM

## 2019-07-05 DIAGNOSIS — E1122 Type 2 diabetes mellitus with diabetic chronic kidney disease: Secondary | ICD-10-CM

## 2019-07-05 DIAGNOSIS — R600 Localized edema: Secondary | ICD-10-CM | POA: Diagnosis not present

## 2019-07-05 DIAGNOSIS — N183 Chronic kidney disease, stage 3 unspecified: Secondary | ICD-10-CM

## 2019-07-05 LAB — POCT GLYCOSYLATED HEMOGLOBIN (HGB A1C)
Est. average glucose Bld gHb Est-mCnc: 117
Hemoglobin A1C: 5.7 % — AB (ref 4.0–5.6)

## 2019-07-05 MED ORDER — LISINOPRIL 40 MG PO TABS: 40.0000 mg | ORAL_TABLET | Freq: Every day | ORAL | 3 refills | Status: DC

## 2019-07-05 NOTE — Patient Instructions (Signed)
DASH Eating Plan DASH stands for "Dietary Approaches to Stop Hypertension." The DASH eating plan is a healthy eating plan that has been shown to reduce high blood pressure (hypertension). It may also reduce your risk for type 2 diabetes, heart disease, and stroke. The DASH eating plan may also help with weight loss. What are tips for following this plan?  General guidelines  Avoid eating more than 2,300 mg (milligrams) of salt (sodium) a day. If you have hypertension, you may need to reduce your sodium intake to 1,500 mg a day.  Limit alcohol intake to no more than 1 drink a day for nonpregnant women and 2 drinks a day for men. One drink equals 12 oz of beer, 5 oz of wine, or 1 oz of hard liquor.  Work with your health care provider to maintain a healthy body weight or to lose weight. Ask what an ideal weight is for you.  Get at least 30 minutes of exercise that causes your heart to beat faster (aerobic exercise) most days of the week. Activities may include walking, swimming, or biking.  Work with your health care provider or diet and nutrition specialist (dietitian) to adjust your eating plan to your individual calorie needs. Reading food labels   Check food labels for the amount of sodium per serving. Choose foods with less than 5 percent of the Daily Value of sodium. Generally, foods with less than 300 mg of sodium per serving fit into this eating plan.  To find whole grains, look for the word "whole" as the first word in the ingredient list. Shopping  Buy products labeled as "low-sodium" or "no salt added."  Buy fresh foods. Avoid canned foods and premade or frozen meals. Cooking  Avoid adding salt when cooking. Use salt-free seasonings or herbs instead of table salt or sea salt. Check with your health care provider or pharmacist before using salt substitutes.  Do not fry foods. Cook foods using healthy methods such as baking, boiling, grilling, and broiling instead.  Cook with  heart-healthy oils, such as olive, canola, soybean, or sunflower oil. Meal planning  Eat a balanced diet that includes: ? 5 or more servings of fruits and vegetables each day. At each meal, try to fill half of your plate with fruits and vegetables. ? Up to 6-8 servings of whole grains each day. ? Less than 6 oz of lean meat, poultry, or fish each day. A 3-oz serving of meat is about the same size as a deck of cards. One egg equals 1 oz. ? 2 servings of low-fat dairy each day. ? A serving of nuts, seeds, or beans 5 times each week. ? Heart-healthy fats. Healthy fats called Omega-3 fatty acids are found in foods such as flaxseeds and coldwater fish, like sardines, salmon, and mackerel.  Limit how much you eat of the following: ? Canned or prepackaged foods. ? Food that is high in trans fat, such as fried foods. ? Food that is high in saturated fat, such as fatty meat. ? Sweets, desserts, sugary drinks, and other foods with added sugar. ? Full-fat dairy products.  Do not salt foods before eating.  Try to eat at least 2 vegetarian meals each week.  Eat more home-cooked food and less restaurant, buffet, and fast food.  When eating at a restaurant, ask that your food be prepared with less salt or no salt, if possible. What foods are recommended? The items listed may not be a complete list. Talk with your dietitian about   what dietary choices are best for you. Grains Whole-grain or whole-wheat bread. Whole-grain or whole-wheat pasta. Brown rice. Oatmeal. Quinoa. Bulgur. Whole-grain and low-sodium cereals. Pita bread. Low-fat, low-sodium crackers. Whole-wheat flour tortillas. Vegetables Fresh or frozen vegetables (raw, steamed, roasted, or grilled). Low-sodium or reduced-sodium tomato and vegetable juice. Low-sodium or reduced-sodium tomato sauce and tomato paste. Low-sodium or reduced-sodium canned vegetables. Fruits All fresh, dried, or frozen fruit. Canned fruit in natural juice (without  added sugar). Meat and other protein foods Skinless chicken or turkey. Ground chicken or turkey. Pork with fat trimmed off. Fish and seafood. Egg whites. Dried beans, peas, or lentils. Unsalted nuts, nut butters, and seeds. Unsalted canned beans. Lean cuts of beef with fat trimmed off. Low-sodium, lean deli meat. Dairy Low-fat (1%) or fat-free (skim) milk. Fat-free, low-fat, or reduced-fat cheeses. Nonfat, low-sodium ricotta or cottage cheese. Low-fat or nonfat yogurt. Low-fat, low-sodium cheese. Fats and oils Soft margarine without trans fats. Vegetable oil. Low-fat, reduced-fat, or light mayonnaise and salad dressings (reduced-sodium). Canola, safflower, olive, soybean, and sunflower oils. Avocado. Seasoning and other foods Herbs. Spices. Seasoning mixes without salt. Unsalted popcorn and pretzels. Fat-free sweets. What foods are not recommended? The items listed may not be a complete list. Talk with your dietitian about what dietary choices are best for you. Grains Baked goods made with fat, such as croissants, muffins, or some breads. Dry pasta or rice meal packs. Vegetables Creamed or fried vegetables. Vegetables in a cheese sauce. Regular canned vegetables (not low-sodium or reduced-sodium). Regular canned tomato sauce and paste (not low-sodium or reduced-sodium). Regular tomato and vegetable juice (not low-sodium or reduced-sodium). Pickles. Olives. Fruits Canned fruit in a light or heavy syrup. Fried fruit. Fruit in cream or butter sauce. Meat and other protein foods Fatty cuts of meat. Ribs. Fried meat. Bacon. Sausage. Bologna and other processed lunch meats. Salami. Fatback. Hotdogs. Bratwurst. Salted nuts and seeds. Canned beans with added salt. Canned or smoked fish. Whole eggs or egg yolks. Chicken or turkey with skin. Dairy Whole or 2% milk, cream, and half-and-half. Whole or full-fat cream cheese. Whole-fat or sweetened yogurt. Full-fat cheese. Nondairy creamers. Whipped toppings.  Processed cheese and cheese spreads. Fats and oils Butter. Stick margarine. Lard. Shortening. Ghee. Bacon fat. Tropical oils, such as coconut, palm kernel, or palm oil. Seasoning and other foods Salted popcorn and pretzels. Onion salt, garlic salt, seasoned salt, table salt, and sea salt. Worcestershire sauce. Tartar sauce. Barbecue sauce. Teriyaki sauce. Soy sauce, including reduced-sodium. Steak sauce. Canned and packaged gravies. Fish sauce. Oyster sauce. Cocktail sauce. Horseradish that you find on the shelf. Ketchup. Mustard. Meat flavorings and tenderizers. Bouillon cubes. Hot sauce and Tabasco sauce. Premade or packaged marinades. Premade or packaged taco seasonings. Relishes. Regular salad dressings. Where to find more information:  National Heart, Lung, and Blood Institute: www.nhlbi.nih.gov  American Heart Association: www.heart.org Summary  The DASH eating plan is a healthy eating plan that has been shown to reduce high blood pressure (hypertension). It may also reduce your risk for type 2 diabetes, heart disease, and stroke.  With the DASH eating plan, you should limit salt (sodium) intake to 2,300 mg a day. If you have hypertension, you may need to reduce your sodium intake to 1,500 mg a day.  When on the DASH eating plan, aim to eat more fresh fruits and vegetables, whole grains, lean proteins, low-fat dairy, and heart-healthy fats.  Work with your health care provider or diet and nutrition specialist (dietitian) to adjust your eating plan to your   individual calorie needs. This information is not intended to replace advice given to you by your health care provider. Make sure you discuss any questions you have with your health care provider. Document Revised: 05/02/2017 Document Reviewed: 05/13/2016 Elsevier Patient Education  2020 Elsevier Inc.  

## 2019-07-07 ENCOUNTER — Other Ambulatory Visit: Payer: Self-pay | Admitting: Physician Assistant

## 2019-07-07 DIAGNOSIS — M810 Age-related osteoporosis without current pathological fracture: Secondary | ICD-10-CM

## 2019-08-08 ENCOUNTER — Ambulatory Visit: Payer: Medicare Other

## 2019-08-08 ENCOUNTER — Ambulatory Visit: Payer: Medicare Other | Attending: Internal Medicine

## 2019-08-08 DIAGNOSIS — Z23 Encounter for immunization: Secondary | ICD-10-CM

## 2019-08-08 NOTE — Progress Notes (Signed)
   Covid-19 Vaccination Clinic  Name:  Robin Ashley    MRN: [id number] DOB: 12/04/1931  08/08/2019  [name] was observed post Covid-19 immunization for 15 minutes without incident. She was provided with Vaccine Information Sheet and instruction to access the V-Safe system.   [name] was instructed to call 911 with any severe reactions post vaccine: Marland Kitchen Difficulty breathing  . Swelling of face and throat  . A fast heartbeat  . A bad rash all over body  . Dizziness and weakness   Immunizations Administered    Name Date Dose VIS Date Route   Pfizer COVID-19 Vaccine 08/08/2019 12:19 PM 0.3 mL 05/14/2019 Intramuscular   Manufacturer: [place]   Lot: [id number]   [place]: [id number]

## 2019-08-31 ENCOUNTER — Ambulatory Visit: Payer: Medicare Other | Attending: Internal Medicine

## 2019-08-31 DIAGNOSIS — Z23 Encounter for immunization: Secondary | ICD-10-CM

## 2019-08-31 NOTE — Progress Notes (Signed)
   Covid-19 Vaccination Clinic  Name:  Robin Ashley    MRN: [id number] DOB: Oct 18, 1931  08/31/2019  Robin Ashley was observed post Covid-19 immunization for 15 minutes without incident. She was provided with Vaccine Information Sheet and instruction to access the V-Safe system.   Robin Ashley was instructed to call 911 with any severe reactions post vaccine: Marland Kitchen Difficulty breathing  . Swelling of face and throat  . A fast heartbeat  . A bad rash all over body  . Dizziness and weakness   Immunizations Administered    Name Date Dose VIS Date Route   Pfizer COVID-19 Vaccine 08/31/2019  4:01 PM 0.3 mL 05/14/2019 Intramuscular   Manufacturer: [place]   Lot: [id number]   [place]: [id number]

## 2019-10-20 NOTE — Progress Notes (Signed)
Subjective:   Robin Ashley is a 86 y.o. female who presents for Medicare Annual (Subsequent) preventive examination.  I connected with Wakisha Alberts today by telephone and verified that I am speaking with the correct person using two identifiers. Location patient: home Location provider: work Persons participating in the virtual visit: patient, provider.   I discussed the limitations, risks, security and privacy concerns of performing an evaluation and management service by telephone and the availability of in person appointments. I also discussed with the patient that there may be a patient responsible charge related to this service. The patient expressed understanding and verbally consented to this telephonic visit.    Interactive audio and video telecommunications were attempted between this provider and patient, however failed, due to patient having technical difficulties OR patient did not have access to video capability.  We continued and completed visit with audio only.  Review of Systems:  N/A  Cardiac Risk Factors include: advanced age (>24mn, >>59women);diabetes mellitus;dyslipidemia;hypertension     Objective:     Vitals: There were no vitals taken for this visit.  There is no height or weight on file to calculate BMI.  Advanced Directives 10/21/2019 10/20/2018 09/30/2017 09/27/2016 09/27/2016 09/26/2016 12/27/2015  Does Patient Have a Medical Advance Directive? Yes Yes Yes No Yes Yes Yes  Type of [place]aramed[place]of [place] will [place] will [place] will - Living will Living will Living will;Healthcare Power of Attorney  Does patient want to make changes to medical advance directive? - - - - - - No - Patient declined  Copy of HPrestonin Chart? No - copy requested No - copy requested No - copy requested - - - -  Would patient like information on creating a medical advance  directive? - - - No - Patient declined - - -    Tobacco Social History   Tobacco Use  Smoking Status Never Smoker  Smokeless Tobacco Never Used     Counseling given: Not Answered   Clinical Intake:  Pre-visit preparation completed: Yes  Pain : No/denies pain Pain Score: 0-No pain     Nutritional Risks: None Diabetes: Yes  How often do you need to have someone help you when you read instructions, pamphlets, or other written materials from your doctor or pharmacy?: 1 - Never   Diabetes:  Is the patient diabetic?  Yes  If diabetic, was a CBG obtained today?  No  Did the patient bring in their glucometer from home?  No  How often do you monitor your CBG's? Does not check.   Financial Strains and Diabetes Management:  Are you having any financial strains with the device, your supplies or your medication? No .  Does the patient want to be seen by Chronic Care Management for management of their diabetes?  No  Would the patient like to be referred to a Nutritionist or for Diabetic Management?  No   Diabetic Exams:  Diabetic Eye Exam: Completed 10/21/18. Overdue for diabetic eye exam. Pt has been advised about the importance in completing this exam. Pt declined scheduling an eye exam at this time.   Diabetic Foot Exam: Completed 04/22/19. Repeat yearly.   Interpreter Needed?: No  Information [facility] :: [facility] L[facility]  Past Medical History:  Diagnosis Date  . 174.4 03/2013   Left breast, T1c, N0, 12 mm; ER PR positive, HER-2/neu not over expressed. Not a candidate for adjuvant chemotherapy [facility].  . Diabetes mellitus without complication ([place])   . Foot drop, right October 2014   noted post mastectomy, conservative treatment was instituted with resolution, mild edema.  . Hyperlipidemia   . Hypertension   . Sleep apnea    Past Surgical History:  Procedure Laterality Date  . BREAST BIOPSY Left 02/25/13   positive  . BREAST SURGERY Left 03-15-13    left mastectomywith SN biopsy  . COLON SURGERY  1991   Likely segmental resection for diverticulitis based on patient description.  . COLONOSCOPY    . EYE SURGERY Bilateral 04/2012  . intestinal balloon removed  1991   Likely surgery for diverticulitis.  Marland Kitchen MASTECTOMY Left 2014   positive  . 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   Family History  Problem Relation Age of Onset  . Cancer Brother        colon  . Diabetes Brother   . Heart disease Brother   . Diabetes Brother   . Lung cancer Son   . Breast cancer Neg Hx    Social History   Socioeconomic History  . Marital status: Widowed    Spouse name: Not on file  . Number of children: 5  . Years of education: Not on file  . Highest education level: 12th grade  Occupational History  . Occupation: retired  Tobacco Use  . Smoking status: Never Smoker  . Smokeless tobacco: Never Used  Substance and Sexual Activity  . Alcohol use: No  . Drug use: No  . Sexual activity: Never  Other Topics Concern  . Not on file  Social History Narrative  . Not on file   Social Determinants of Health   Financial Resource Strain: Low Risk   . Difficulty of Paying Living Expenses: Not hard at all  Food Insecurity: No Food Insecurity  . Worried About Charity fundraiser in the Last Year: Never true  . Ran Out of Food in the Last Year: Never true  Transportation Needs: No Transportation Needs  . Lack of Transportation (Medical): No  . Lack of Transportation (Non-Medical): No  Physical Activity: Inactive  . Days of Exercise per Week: 0 days  . Minutes of Exercise per Session: 0 min  Stress: No Stress Concern Present  . Feeling of Stress : Not at all  Social Connections: Slightly Isolated  . Frequency of Communication with Friends and Family: More than three times a week  . Frequency of Social Gatherings with Friends and Family: More than three times a week  . Attends Religious  Services: More than 4 times per year  . Active Member of Clubs or Organizations: Yes  . Attends Archivist Meetings: Never  . Marital Status: Widowed    Outpatient Encounter Medications as of 10/21/2019  Medication Sig  . alendronate (FOSAMAX) 70 MG tablet TAKE 1 TABLET EVERY 7 DAYS WITH A FULL GLASS OF WATER ON AN EMPTY STOMACH  . amLODipine (NORVASC) 5 MG tablet   . aspirin 81 MG tablet Take 1 tablet (81 mg total) by mouth daily.  Marland Kitchen atorvastatin (LIPITOR) 80 MG tablet TAKE 1 TABLET DAILY  . ferrous sulfate 325 (65 FE) MG tablet Iron (ferrous sulfate) 325 mg (65 mg iron) tablet  TAKE 1 TABLET BY MOUTH TWICE A DAY  . furosemide (LASIX) 20 MG tablet TAKE 1 TABLET DAILY  . letrozole (FEMARA) 2.5 MG tablet Take 1 tablet (2.5 mg total) by mouth daily.  Marland Kitchen  lisinopril (ZESTRIL) 40 MG tablet Take 1 tablet (40 mg total) by mouth daily.  . meloxicam (MOBIC) 15 MG tablet Take 1 tablet (15 mg total) by mouth daily.  . metFORMIN (GLUCOPHAGE) 500 MG tablet Take 1 tablet (500 mg total) by mouth daily with breakfast.  . Multiple Vitamins-Minerals (CENTRUM SILVER PO) Take 1 tablet by mouth daily.  Marland Kitchen omeprazole (PRILOSEC) 20 MG capsule TAKE 1 CAPSULE DAILY  . Omeprazole 20 MG TBEC omeprazole 20 mg capsule,delayed release  . oxyCODONE (OXY IR/ROXICODONE) 5 MG immediate release tablet oxycodone 5 mg tablet  TAKE 1 TABLET BY MOUTH EVERY 4 HOURS  . polyethylene glycol powder (GLYCOLAX/MIRALAX) powder Take by mouth daily.   . traZODone (DESYREL) 50 MG tablet TAKE 1 TABLET EVERY EVENING   No facility-administered encounter medications on file as of 10/21/2019.    Activities of Daily Living In your present state of health, do you have any difficulty performing the following activities: 10/21/2019  Hearing? N  Vision? N  Difficulty concentrating or making decisions? N  Walking or climbing stairs? N  Dressing or bathing? N  Doing errands, shopping? Y  Comment Does not drive.  Preparing Food and  eating ? N  Using the Toilet? N  In the past six months, have you accidently leaked urine? N  Do you have problems with loss of bowel control? N  Managing your Medications? N  Managing your Finances? N  Housekeeping or managing your Housekeeping? Y  Comment Has assistance once a month cleaning home.  Some recent data might be hidden    Patient Care Team: Rubye Beach as PCP - General (Family Medicine) Rayetta Humphrey as Consulting Physician (Optometry)    Assessment:   This is a routine wellness examination for [place].  Exercise Activities and Dietary recommendations Current Exercise Habits: The patient does not participate in regular exercise at present, Exercise limited by: None identified  Goals    . DIET - DECREASE SODA OR JUICE INTAKE     Recommend to decrease amount of sodas to 1 a day.     . Increase water intake     Recommend increasing water intake to at least 2 glasses a day. Pt declines anymore.       Fall Risk: Fall Risk  10/21/2019 10/20/2018 04/02/2018 09/30/2017 09/26/2016  Falls in the past year? 0 0 No No No  Number falls in past yr: 0 - - - -  Injury with Fall? 0 - - - -    FALL RISK PREVENTION PERTAINING TO THE HOME:  Any stairs in or around the home? No  If so, are there any without handrails? N/A  Home free of loose throw rugs in walkways, pet beds, electrical cords, etc? Yes  Adequate lighting in your home to reduce risk of falls? Yes   ASSISTIVE DEVICES UTILIZED TO PREVENT FALLS:  Life alert? Yes  Use of a cane, walker or w/c? No  Grab bars in the bathroom? Yes  Shower chair or bench in shower? Yes  Elevated toilet seat or a handicapped toilet? Yes   TIMED UP AND GO:  Was the test performed? No .    Depression Screen PHQ 2/9 Scores 10/21/2019 10/20/2018 10/07/2018 09/30/2017  PHQ - 2 Score 0 0 0 0  PHQ- 9 Score - - - -     Cognitive Function: Declined today.     6CIT Screen 10/20/2018 09/26/2016  What Year? 0 points 0  points  What month? 0 points  0 points  What time? 0 points 0 points  Count back from 20 0 points 0 points  Months in reverse 0 points 0 points  Repeat phrase 0 points 4 points  Total Score 0 4    Immunization History  Administered Date(s) Administered  . Fluad Quad(high Dose 65+) 04/12/2019  . Influenza Split 03/14/2010, 02/28/2011, 02/27/2012, 01/27/2013  . Influenza, High Dose Seasonal PF 02/17/2015, 03/06/2016, 03/28/2017, 04/06/2018  . PFIZER SARS-COV-2 Vaccination 08/08/2019, 08/31/2019  . Pneumococcal Conjugate-13 03/07/2014  . Pneumococcal Polysaccharide-23 06/19/2015  . Tdap 02/28/2011  . Zoster 05/11/2012    Qualifies for Shingles Vaccine? Yes  Zostavax completed 05/11/12. Due for Shingrix. Pt has been advised to call insurance company to determine out of pocket expense. Advised may also receive vaccine at local pharmacy or Health Dept. Verbalized acceptance and understanding.  Tdap: Up to date  Flu Vaccine: Up to date  Pneumococcal Vaccine: Completed series  Screening Tests Health Maintenance  Topic Date Due  . DEXA SCAN  04/17/2019  . OPHTHALMOLOGY EXAM  10/21/2019  . INFLUENZA VACCINE  01/02/2020  . HEMOGLOBIN A1C  01/02/2020  . FOOT EXAM  04/11/2020  . TETANUS/TDAP  02/27/2021  . COVID-19 Vaccine  Completed  . PNA vac Low Risk Adult  Completed    Cancer Screenings:  Colorectal Screening: No longer required.   Mammogram: No longer required.   Bone Density: Completed 04/16/17. Results reflect OSTEOPOROSIS. Repeat every 2 years.   Lung Cancer Screening: (Low Dose CT Chest recommended if Age 43-80 years, 30 pack-year currently smoking OR have quit w/in 15years.) does not qualify.   Additional Screening:  Dental Screening: Recommended annual dental exams for proper oral hygiene   Community Resource Referral:  CRR required this visit? No     Plan:  I have personally reviewed and addressed the Medicare Annual Wellness questionnaire and have noted  the following in the patient's chart:  A. Medical and social history B. Use of alcohol, tobacco or illicit drugs  C. Current medications and supplements D. Functional ability and status E.  Nutritional status F.  Physical activity G. Advance directives H. List of other physicians I.  Hospitalizations, surgeries, and ER visits in previous 12 months J.  [place] such as hearing and vision if needed, cognitive and depression L. Referrals and appointments   In addition, I have reviewed and discussed with patient certain preventive protocols, quality metrics, and best practice recommendations. A written personalized care plan for preventive services as well as general preventive health recommendations were provided to patient.   [place]endora Score, [place]  [id number]/[id number] Nurse Health Advisor   Nurse Notes: Pt declined scheduling an eye exam this year. Pt to follow up with PCP about repeating the DEXA scan.

## 2019-10-21 ENCOUNTER — Ambulatory Visit (INDEPENDENT_AMBULATORY_CARE_PROVIDER_SITE_OTHER): Payer: Medicare Other

## 2019-10-21 ENCOUNTER — Other Ambulatory Visit: Payer: Self-pay

## 2019-10-21 DIAGNOSIS — Z0000 Encounter for general adult medical examination without abnormal findings: Secondary | ICD-10-CM

## 2019-10-21 NOTE — Patient Instructions (Signed)
Robin Ashley , Thank you for taking time to come for your Medicare Wellness Visit. I appreciate your ongoing commitment to your health goals. Please review the following plan we discussed and let me know if I can assist you in the future.   Screening recommendations/referrals: Colonoscopy: No longer required.  Mammogram: No longer required.  Bone Density: Up to date, due 04/2019 Recommended yearly ophthalmology/optometry visit for glaucoma screening and checkup Recommended yearly dental visit for hygiene and checkup  Vaccinations: Influenza vaccine: Up to date Pneumococcal vaccine: Completed series Tdap vaccine: Up to date, due 02/2021 Shingles vaccine: Pt declines today.     Advanced directives: Please bring a copy of your POA (Power of Attorney) and/or Living Will to your next appointment.   Conditions/risks identified: Recommend to cut back on soda intake to no more than 1 a day and increase water intake to 6-8 8 oz glasses a day.   Next appointment: 10/25/19 @ 8:00 AM with [name]. Declined scheduling an AWV for 2022 at this time.    Preventive Care 5 Years and Older, Female Preventive care refers to lifestyle choices and visits with your health care provider that can promote health and wellness. What does preventive care include?  A yearly physical exam. This is also called an annual well check.  Dental exams once or twice a year.  Routine eye exams. Ask your health care provider how often you should have your eyes checked.  Personal lifestyle choices, including:  Daily care of your teeth and gums.  Regular physical activity.  Eating a healthy diet.  Avoiding tobacco and drug use.  Limiting alcohol use.  Practicing safe sex.  Taking low-dose aspirin every day.  Taking vitamin and mineral supplements as recommended by your health care provider. What happens during an annual well check? The services and screenings done by your health care provider during  your annual well check will depend on your age, overall health, lifestyle risk factors, and family history of disease. Counseling  Your health care provider may ask you questions about your:  Alcohol use.  Tobacco use.  Drug use.  Emotional well-being.  Home and relationship well-being.  Sexual activity.  Eating habits.  History of falls.  Memory and ability to understand (cognition).  Work and work Statistician.  Reproductive health. Screening  You may have the following tests or measurements:  Height, weight, and BMI.  Blood pressure.  Lipid and cholesterol levels. These may be checked every 5 years, or more frequently if you are over 3 years old.  Skin check.  Lung cancer screening. You may have this screening every year starting at age 40 if you have a 30-pack-year history of smoking and currently smoke or have quit within the past 15 years.  Fecal occult blood test (FOBT) of the stool. You may have this test every year starting at age 9.  Flexible sigmoidoscopy or colonoscopy. You may have a sigmoidoscopy every 5 years or a colonoscopy every 10 years starting at age 62.  Hepatitis C blood test.  Hepatitis B blood test.  Sexually transmitted disease (STD) testing.  Diabetes screening. This is done by checking your blood sugar (glucose) after you have not eaten for a while (fasting). You may have this done every 1-3 years.  Bone density scan. This is done to screen for osteoporosis. You may have this done starting at age 81.  Mammogram. This may be done every 1-2 years. Talk to your health care provider about how often you should  have regular mammograms. Talk with your health care provider about your test results, treatment options, and if necessary, the need for more tests. Vaccines  Your health care provider may recommend certain vaccines, such as:  Influenza vaccine. This is recommended every year.  Tetanus, diphtheria, and acellular pertussis (Tdap,  Td) vaccine. You may need a Td booster every 10 years.  Zoster vaccine. You may need this after age 55.  Pneumococcal 13-valent conjugate (PCV13) vaccine. One dose is recommended after age 67.  Pneumococcal polysaccharide (PPSV23) vaccine. One dose is recommended after age 70. Talk to your health care provider about which screenings and vaccines you need and how often you need them. This information is not intended to replace advice given to you by your health care provider. Make sure you discuss any questions you have with your health care provider. Document Released: 06/16/2015 Document Revised: 02/07/2016 Document Reviewed: 03/21/2015 Elsevier Interactive Patient Education  2017 [employer] Prevention in the Home Falls can cause injuries. They can happen to people of all ages. There are many things you can do to make your home safe and to help prevent falls. What can I do on the outside of my home?  Regularly fix the edges of walkways and driveways and fix any cracks.  Remove anything that might make you trip as you walk through a door, such as a raised step or threshold.  Trim any bushes or trees on the path to your home.  Use bright outdoor lighting.  Clear any walking paths of anything that might make someone trip, such as rocks or tools.  Regularly check to see if handrails are loose or broken. Make sure that both sides of any steps have handrails.  Any raised decks and porches should have guardrails on the edges.  Have any leaves, snow, or ice cleared regularly.  Use sand or salt on walking paths during winter.  Clean up any spills in your garage right away. This includes oil or grease spills. What can I do in the bathroom?  Use night lights.  Install grab bars by the toilet and in the tub and shower. Do not use towel bars as grab bars.  Use non-skid mats or decals in the tub or shower.  If you need to sit down in the shower, use a plastic, non-slip  stool.  Keep the floor dry. Clean up any water that spills on the floor as soon as it happens.  Remove soap buildup in the tub or shower regularly.  Attach bath mats securely with double-sided non-slip rug tape.  Do not have throw rugs and other things on the floor that can make you trip. What can I do in the bedroom?  Use night lights.  Make sure that you have a light by your bed that is easy to reach.  Do not use any sheets or blankets that are too big for your bed. They should not hang down onto the floor.  Have a firm chair that has side arms. You can use this for support while you get dressed.  Do not have throw rugs and other things on the floor that can make you trip. What can I do in the kitchen?  Clean up any spills right away.  Avoid walking on wet floors.  Keep items that you use a lot in easy-to-reach places.  If you need to reach something above you, use a strong step stool that has a grab bar.  Keep electrical cords out of  the way.  Do not use floor polish or wax that makes floors slippery. If you must use wax, use non-skid floor wax.  Do not have throw rugs and other things on the floor that can make you trip. What can I do with my stairs?  Do not leave any items on the stairs.  Make sure that there are handrails on both sides of the stairs and use them. Fix handrails that are broken or loose. Make sure that handrails are as long as the stairways.  Check any carpeting to make sure that it is firmly attached to the stairs. Fix any carpet that is loose or worn.  Avoid having throw rugs at the top or bottom of the stairs. If you do have throw rugs, attach them to the floor with carpet tape.  Make sure that you have a light switch at the top of the stairs and the bottom of the stairs. If you do not have them, ask someone to add them for you. What else can I do to help prevent falls?  Wear shoes that:  Do not have high heels.  Have rubber bottoms.  Are  comfortable and fit you well.  Are closed at the toe. Do not wear sandals.  If you use a stepladder:  Make sure that it is fully opened. Do not climb a closed stepladder.  Make sure that both sides of the stepladder are locked into place.  Ask someone to hold it for you, if possible.  Clearly mark and make sure that you can see:  Any grab bars or handrails.  First and last steps.  Where the edge of each step is.  Use tools that help you move around (mobility aids) if they are needed. These include:  Canes.  Walkers.  Scooters.  Crutches.  Turn on the lights when you go into a dark area. Replace any light bulbs as soon as they burn out.  Set up your furniture so you have a clear path. Avoid moving your furniture around.  If any of your floors are uneven, fix them.  If there are any pets around you, be aware of where they are.  Review your medicines with your doctor. Some medicines can make you feel dizzy. This can increase your chance of falling. Ask your doctor what other things that you can do to help prevent falls. This information is not intended to replace advice given to you by your health care provider. Make sure you discuss any questions you have with your health care provider. Document Released: 03/16/2009 Document Revised: 10/26/2015 Document Reviewed: 06/24/2014 Elsevier Interactive Patient Education  2017 [employer].

## 2019-10-22 NOTE — Progress Notes (Deleted)
Established patient visit   Patient: Robin Ashley   DOB: January 06, 1932   86 y.o. Female  MRN: [id number] Visit Date: 10/25/2019  Today's healthcare provider: Mar Daring, PA-C   No chief complaint on file.  Subjective    HPI Patient had her AWV 10/21/19 Diabetes Mellitus Type II, Follow-up  Lab Results  Component Value Date   HGBA1C 5.7 (A) 07/05/2019   HGBA1C 5.6 04/12/2019   HGBA1C 6.2 (H) 01/08/2019   Wt Readings from Last 3 Encounters:  07/05/19 214 lb (97.1 kg)  04/12/19 212 lb (96.2 kg)  01/08/19 207 lb (93.9 kg)   Last seen for diabetes 3 months ago.  Management since then includes Stable. Continue Metformin 500mg  daily. She reports {excellent/good/fair/poor:19665} compliance with treatment. She {is/is not:21021397} having side effects. {document side effects if present:1} Symptoms: {Yes/No:20286} fatigue {Yes/No:20286} foot ulcerations  {Yes/No:20286} appetite changes {Yes/No:20286} nausea  {Yes/No:20286} paresthesia of the feet  {Yes/No:20286} polydipsia  {Yes/No:20286} polyuria {Yes/No:20286} visual disturbances   {Yes/No:20286} vomiting     Home blood sugar records: {diabetes glucometry results:16657}  Episodes of hypoglycemia? {Yes/No:20286} {enter symptoms and frequency of symptoms if yes:1}   Current insulin regiment: {enter 'none' or type of insulin and number of units taken with each dose of each insulin formulation that the patient is taking:1} Most Recent Eye Exam: *** {Current exercise:16438:::1} {Current diet habits:16563:::1}  Pertinent Labs: Lab Results  Component Value Date   CHOL 127 01/08/2019   HDL 46 01/08/2019   LDLCALC 54 01/08/2019   TRIG 135 01/08/2019   CHOLHDL 2.8 01/08/2019   Lab Results  Component Value Date   NA 136 01/08/2019   K 4.4 01/08/2019   CREATININE 1.34 (H) 01/08/2019   GFRNONAA 36 (L) 01/08/2019   GFRAA 42 (L) 01/08/2019   GLUCOSE 106 (H) 01/08/2019     Hypertension, follow-up  BP  Readings from Last 3 Encounters:  07/05/19 (!) 180/92  04/12/19 140/76  01/08/19 (!) 156/84   Wt Readings from Last 3 Encounters:  07/05/19 214 lb (97.1 kg)  04/12/19 212 lb (96.2 kg)  01/08/19 207 lb (93.9 kg)     She was last seen for hypertension 3 months ago.  BP at that visit was 180/92. Management since that visit includes Elevated today. Increased lisinopril to 40mg . Continue Amlodipine 5mg   She reports {excellent/good/fair/poor:19665} compliance with treatment. She {is/is not:9024} having side effects. {document side effects if present:1} She is following a {diet:21022986} diet. She {is/is not:9024} exercising. She {does/does not:200015} smoke.  Use of agents associated with hypertension: {bp agents assoc with hypertension:511::"none"}.   Outside blood pressures are {***enter patient reported home BP readings, or 'not being checked':1}. Symptoms: {Yes/No:20286} chest pain {Yes/No:20286} chest pressure  {Yes/No:20286} palpitations {Yes/No:20286} syncope  {Yes/No:20286} dyspnea {Yes/No:20286} orthopnea  {Yes/No:20286} paroxysmal nocturnal dyspnea {Yes/No:20286} lower extremity edema   Pertinent labs: Lab Results  Component Value Date   CHOL 127 01/08/2019   HDL 46 01/08/2019   LDLCALC 54 01/08/2019   TRIG 135 01/08/2019   CHOLHDL 2.8 01/08/2019   Lab Results  Component Value Date   NA 136 01/08/2019   K 4.4 01/08/2019   CREATININE 1.34 (H) 01/08/2019   GFRNONAA 36 (L) 01/08/2019   GFRAA 42 (L) 01/08/2019   GLUCOSE 106 (H) 01/08/2019     The ASCVD Risk score (Goff DC Jr., et al., 2013) failed to calculate for the following reasons:   The 2013 ASCVD risk score is only valid for ages 65 to 18   --------------------------------------------------------------------------------------------------- ---------------------------------------------------------------------------------------------------  {  Show patient history (optional):23778::" "}   Medications:  Outpatient Medications Prior to Visit  Medication Sig  . alendronate (FOSAMAX) 70 MG tablet TAKE 1 TABLET EVERY 7 DAYS WITH A FULL GLASS OF WATER ON AN EMPTY STOMACH  . amLODipine (NORVASC) 5 MG tablet   . aspirin 81 MG tablet Take 1 tablet (81 mg total) by mouth daily.  Marland Kitchen atorvastatin (LIPITOR) 80 MG tablet TAKE 1 TABLET DAILY  . ferrous sulfate 325 (65 FE) MG tablet Iron (ferrous sulfate) 325 mg (65 mg iron) tablet  TAKE 1 TABLET BY MOUTH TWICE A DAY  . furosemide (LASIX) 20 MG tablet TAKE 1 TABLET DAILY  . letrozole (FEMARA) 2.5 MG tablet Take 1 tablet (2.5 mg total) by mouth daily.  Marland Kitchen lisinopril (ZESTRIL) 40 MG tablet Take 1 tablet (40 mg total) by mouth daily.  . meloxicam (MOBIC) 15 MG tablet Take 1 tablet (15 mg total) by mouth daily.  . metFORMIN (GLUCOPHAGE) 500 MG tablet Take 1 tablet (500 mg total) by mouth daily with breakfast.  . Multiple Vitamins-Minerals (CENTRUM SILVER PO) Take 1 tablet by mouth daily.  Marland Kitchen omeprazole (PRILOSEC) 20 MG capsule TAKE 1 CAPSULE DAILY  . Omeprazole 20 MG TBEC omeprazole 20 mg capsule,delayed release  . oxyCODONE (OXY IR/ROXICODONE) 5 MG immediate release tablet oxycodone 5 mg tablet  TAKE 1 TABLET BY MOUTH EVERY 4 HOURS  . polyethylene glycol powder (GLYCOLAX/MIRALAX) powder Take by mouth daily.   . traZODone (DESYREL) 50 MG tablet TAKE 1 TABLET EVERY EVENING   No facility-administered medications prior to visit.    Review of Systems  {Heme  Chem  Endocrine  Serology  Results Review (optional):23779::" "}  Objective    There were no vitals taken for this visit. {Show previous vital signs (optional):23777::" "}  Physical Exam  ***  No results found for any visits on 10/25/19.  Assessment & Plan     ***  No follow-ups on file.      {provider attestation***:1}   Rubye Beach  [facility] [phone] (phone) [phone] (fax)  [place]

## 2019-10-25 ENCOUNTER — Ambulatory Visit: Payer: Medicare Other | Admitting: Physician Assistant

## 2019-11-10 ENCOUNTER — Other Ambulatory Visit: Payer: Self-pay | Admitting: Physician Assistant

## 2019-11-10 DIAGNOSIS — E7800 Pure hypercholesterolemia, unspecified: Secondary | ICD-10-CM

## 2019-11-10 NOTE — Telephone Encounter (Signed)
Requested Prescriptions  Pending Prescriptions Disp Refills  . atorvastatin (LIPITOR) 80 MG tablet [Pharmacy Med Name: ATORVASTATIN TABS 80MG ] 90 tablet 0    Sig: TAKE 1 TABLET DAILY     Cardiovascular:  Antilipid - Statins Passed - 11/10/2019  3:25 AM      Passed - Total Cholesterol in normal range and within 360 days    Cholesterol, Total  Date Value Ref Range Status  01/08/2019 127 100 - 199 mg/dL Final         Passed - LDL in normal range and within 360 days    LDL Calculated  Date Value Ref Range Status  01/08/2019 54 0 - 99 mg/dL Final         Passed - HDL in normal range and within 360 days    HDL  Date Value Ref Range Status  01/08/2019 46 >39 mg/dL Final         Passed - Triglycerides in normal range and within 360 days    Triglycerides  Date Value Ref Range Status  01/08/2019 135 0 - 149 mg/dL Final         Passed - Patient is not pregnant      Passed - Valid encounter within last 12 months    Recent Outpatient Visits          4 months ago Type 2 diabetes mellitus with stage 3 chronic kidney disease, without long-term current use of insulin, unspecified whether stage 3a or 3b CKD [facility])   [place], [place], PA-C   7 months ago Type 2 diabetes mellitus with stage 3 chronic kidney disease, without long-term current use of insulin, unspecified whether stage 3a or 3b CKD [facility])   [place], Anderson Malta M, PA-C   10 months ago Benign hypertension with CKD (chronic kidney disease) stage III [facility])   [place], [place], PA-C   1 year ago Type 2 diabetes mellitus with stage 3 chronic kidney disease, without long-term current use of insulin [facility])   [place], [place], [place]   1 year ago Type 2 diabetes mellitus with stage 3 chronic kidney disease, without long-term current use of insulin [facility])   [facility], [place], [place]

## 2020-01-08 ENCOUNTER — Other Ambulatory Visit: Payer: Self-pay | Admitting: Physician Assistant

## 2020-01-08 DIAGNOSIS — G4700 Insomnia, unspecified: Secondary | ICD-10-CM

## 2020-01-25 ENCOUNTER — Other Ambulatory Visit: Payer: Self-pay | Admitting: Physician Assistant

## 2020-01-25 DIAGNOSIS — G4700 Insomnia, unspecified: Secondary | ICD-10-CM

## 2020-02-08 ENCOUNTER — Other Ambulatory Visit: Payer: Self-pay | Admitting: Physician Assistant

## 2020-02-08 DIAGNOSIS — E7800 Pure hypercholesterolemia, unspecified: Secondary | ICD-10-CM

## 2020-03-14 DIAGNOSIS — Z7902 Long term (current) use of antithrombotics/antiplatelets: Secondary | ICD-10-CM | POA: Diagnosis not present

## 2020-03-14 DIAGNOSIS — R600 Localized edema: Secondary | ICD-10-CM | POA: Diagnosis not present

## 2020-03-14 DIAGNOSIS — Z823 Family history of stroke: Secondary | ICD-10-CM | POA: Diagnosis not present

## 2020-03-14 DIAGNOSIS — Z1379 Encounter for other screening for genetic and chromosomal anomalies: Secondary | ICD-10-CM | POA: Diagnosis not present

## 2020-03-14 DIAGNOSIS — I10 Essential (primary) hypertension: Secondary | ICD-10-CM | POA: Diagnosis not present

## 2020-03-14 DIAGNOSIS — Z8249 Family history of ischemic heart disease and other diseases of the circulatory system: Secondary | ICD-10-CM | POA: Diagnosis not present

## 2020-04-06 ENCOUNTER — Other Ambulatory Visit: Payer: Self-pay | Admitting: Physician Assistant

## 2020-04-06 DIAGNOSIS — E1142 Type 2 diabetes mellitus with diabetic polyneuropathy: Secondary | ICD-10-CM

## 2020-04-06 NOTE — Telephone Encounter (Signed)
Requested medications are due for refill today yes  Requested medications are on the active medication list yes  Last refill 8/10  Last visit 07/2019  Future visit scheduled no  Notes to clinic Failed protocol of visit within 6 months, no upcoming visit scheduled.

## 2020-04-17 ENCOUNTER — Other Ambulatory Visit: Payer: Self-pay | Admitting: Physician Assistant

## 2020-04-17 DIAGNOSIS — N183 Chronic kidney disease, stage 3 unspecified: Secondary | ICD-10-CM

## 2020-04-17 DIAGNOSIS — I129 Hypertensive chronic kidney disease with stage 1 through stage 4 chronic kidney disease, or unspecified chronic kidney disease: Secondary | ICD-10-CM

## 2020-04-24 ENCOUNTER — Other Ambulatory Visit: Payer: Self-pay | Admitting: Physician Assistant

## 2020-04-24 DIAGNOSIS — G4700 Insomnia, unspecified: Secondary | ICD-10-CM

## 2020-04-24 NOTE — Progress Notes (Signed)
Established patient visit   Patient: Robin Ashley   DOB: 03/04/1932   86 y.o. Female  MRN: [id number] Visit Date: 04/25/2020  Today's healthcare provider: Mar Daring, PA-C   Chief Complaint  Patient presents with  . Follow-up   Subjective    HPI Had AWV with [facility] 10/21/2019  Diabetes Mellitus Type II, Follow-up  Lab Results  Component Value Date   HGBA1C 5.7 (A) 07/05/2019   HGBA1C 5.6 04/12/2019   HGBA1C 6.2 (H) 01/08/2019   Wt Readings from Last 3 Encounters:  04/25/20 220 lb 8 oz (100 kg)  07/05/19 214 lb (97.1 kg)  04/12/19 212 lb (96.2 kg)   Last seen for diabetes 9 months ago.  Management since then includes Continue Metformin 543m daily. She reports excellent compliance with treatment. She is not having side effects.   Symptoms: No fatigue No foot ulcerations  No appetite changes No nausea  No paresthesia of the feet  No polydipsia  No polyuria No visual disturbances   No vomiting     Home blood sugar records: not being checked  Episodes of hypoglycemia? Not being checked  Current insulin regiment: none Current exercise: none Current diet habits: regular  Pertinent Labs: Lab Results  Component Value Date   CHOL 127 01/08/2019   HDL 46 01/08/2019   LDLCALC 54 01/08/2019   TRIG 135 01/08/2019   CHOLHDL 2.8 01/08/2019   Lab Results  Component Value Date   NA 136 01/08/2019   K 4.4 01/08/2019   CREATININE 1.34 (H) 01/08/2019   GFRNONAA 36 (L) 01/08/2019   GFRAA 42 (L) 01/08/2019   GLUCOSE 106 (H) 01/08/2019     --------------------------------------------------------------------------------------------------- Hypertension, follow-up  BP Readings from Last 3 Encounters:  04/25/20 (!) 189/87  07/05/19 (!) 180/92  04/12/19 140/76   Wt Readings from Last 3 Encounters:  04/25/20 220 lb 8 oz (100 kg)  07/05/19 214 lb (97.1 kg)  04/12/19 212 lb (96.2 kg)     She was last seen for hypertension 8-9 months ago.  BP at that  visit was 180/92. Management since that visit includes Increased lisinopril to 437m Continue Amlodipine 5m72mShe reports excellent compliance with treatment. She is not having side effects.  She does not smoke.  Outside blood pressures are not being checked. Symptoms: No chest pain No chest pressure  No palpitations No syncope  No dyspnea No orthopnea  No paroxysmal nocturnal dyspnea No lower extremity edema   --------------------------------------------------------------------------------------------------- Patient Active Problem List   Diagnosis Date Noted  . Osteoporosis 05/01/2017  . Postmenopausal bone loss 03/19/2017  . Pubic ramus fracture (HCCMineral Point4/27/2018  . Lymphedema 08/21/2016  . Osteoarthritis of left shoulder 12/22/2015  . Paresthesia and pain of right extremity 06/19/2015  . Diverticulosis 02/17/2015  . Adaptation reaction 02/16/2015  . At risk for falling 02/16/2015  . Benign hypertension with CKD (chronic kidney disease) stage III (HCCMe[place]9/15/2016  . Adult BMI 30+ 02/16/2015  . Chronic kidney disease (CKD), stage III (moderate) (HCCRadford9/15/2016  . CN (constipation) 02/16/2015  . Diabetes (HCCGar[place]9/15/2016  . Accumulation of fluid in tissues 02/16/2015  . H/O gastric ulcer 02/16/2015  . Hypercholesteremia 02/16/2015  . Insomnia 02/16/2015  . Gonalgia 02/16/2015  . Cramps of lower extremity 02/16/2015  . Fungal infection of toenail 02/16/2015  . Pain in shoulder 02/16/2015  . Arthritis 11/22/2014  . Helicobacter pylori gastrointestinal tract infection 05/18/2014  . Anemia, iron deficiency 03/27/2014  . Bilateral lower extremity edema 02/22/2014  .  History of breast cancer 03/04/2013   Past Medical History:  Diagnosis Date  . 174.4 03/2013   Left breast, T1c, N0, 12 mm; ER PR positive, HER-2/neu not over expressed. Not a candidate for adjuvant chemotherapy per [facility] tumor board.  . Diabetes mellitus without complication ([place])   . Foot drop, right October  2014   noted post mastectomy, conservative treatment was instituted with resolution, mild edema.  . Hyperlipidemia   . Hypertension   . Sleep apnea        Medications: Outpatient Medications Prior to Visit  Medication Sig  . alendronate (FOSAMAX) 70 MG tablet TAKE 1 TABLET EVERY 7 DAYS WITH A FULL GLASS OF WATER ON AN EMPTY STOMACH  . amLODipine (NORVASC) 5 MG tablet   . aspirin 81 MG tablet Take 1 tablet (81 mg total) by mouth daily.  Marland Kitchen atorvastatin (LIPITOR) 80 MG tablet TAKE 1 TABLET DAILY  . ferrous sulfate 325 (65 FE) MG tablet Iron (ferrous sulfate) 325 mg (65 mg iron) tablet  TAKE 1 TABLET BY MOUTH TWICE A DAY  . furosemide (LASIX) 20 MG tablet TAKE 1 TABLET DAILY  . letrozole (FEMARA) 2.5 MG tablet Take 1 tablet (2.5 mg total) by mouth daily.  Marland Kitchen lisinopril (ZESTRIL) 40 MG tablet TAKE 1 TABLET DAILY  . meloxicam (MOBIC) 15 MG tablet Take 1 tablet (15 mg total) by mouth daily.  . metFORMIN (GLUCOPHAGE) 500 MG tablet Take 1 tablet (500 mg total) by mouth daily with breakfast.  . Multiple Vitamins-Minerals (CENTRUM SILVER PO) Take 1 tablet by mouth daily.  Marland Kitchen omeprazole (PRILOSEC) 20 MG capsule TAKE 1 CAPSULE DAILY  . Omeprazole 20 MG TBEC omeprazole 20 mg capsule,delayed release  . polyethylene glycol powder (GLYCOLAX/MIRALAX) powder Take by mouth daily.   . traZODone (DESYREL) 50 MG tablet TAKE 1 TABLET EVERY EVENING (NEED APPOINTMENT)  . oxyCODONE (OXY IR/ROXICODONE) 5 MG immediate release tablet oxycodone 5 mg tablet  TAKE 1 TABLET BY MOUTH EVERY 4 HOURS   No facility-administered medications prior to visit.    Review of Systems  Constitutional: Negative.   Respiratory: Negative.   Cardiovascular: Negative.   Musculoskeletal: Positive for arthralgias and gait problem.  Neurological: Positive for weakness and numbness.    Last CBC Lab Results  Component Value Date   WBC 9.8 04/25/2020   HGB 12.4 04/25/2020   HCT 36.3 04/25/2020   MCV 92 04/25/2020   MCH 31.3  04/25/2020   RDW 13.4 04/25/2020   PLT 287 78/29/5621   Last metabolic panel Lab Results  Component Value Date   GLUCOSE 98 04/25/2020   NA 141 04/25/2020   K 3.5 04/25/2020   CL 105 04/25/2020   CO2 22 04/25/2020   BUN 11 04/25/2020   CREATININE 1.20 (H) 04/25/2020   GFRNONAA 41 (L) 04/25/2020   GFRAA 47 (L) 04/25/2020   CALCIUM 8.6 (L) 04/25/2020   PROT 6.6 04/25/2020   ALBUMIN 3.9 04/25/2020   LABGLOB 2.7 04/25/2020   AGRATIO 1.4 04/25/2020   BILITOT 0.4 04/25/2020   ALKPHOS 169 (H) 04/25/2020   AST 13 04/25/2020   ALT 12 04/25/2020   ANIONGAP 9 09/29/2016      Objective    BP (!) 189/87 (BP Location: Left Arm, Patient Position: Sitting, Cuff Size: Large)   Pulse 93   Temp 98.3 F (36.8 C) (Oral)   Resp 16   Ht _0  (1.702 m)   Wt 220 lb 8 oz (100 kg)   BMI 34.54 kg/m  BP Readings  from Last 3 Encounters:  04/25/20 (!) 189/87  07/05/19 (!) 180/92  04/12/19 140/76   Wt Readings from Last 3 Encounters:  04/25/20 220 lb 8 oz (100 kg)  07/05/19 214 lb (97.1 kg)  04/12/19 212 lb (96.2 kg)      Physical Exam Vitals reviewed.  Constitutional:      General: She is not in acute distress.    Appearance: Normal appearance. She is well-developed. She is obese. She is not ill-appearing or diaphoretic.  Cardiovascular:     Rate and Rhythm: Normal rate and regular rhythm.     Pulses: Normal pulses.     Heart sounds: Normal heart sounds. No murmur heard.  No friction rub. No gallop.   Pulmonary:     Effort: Pulmonary effort is normal. No respiratory distress.     Breath sounds: Normal breath sounds. No wheezing or rales.  Musculoskeletal:     Cervical back: Normal range of motion and neck supple.  Neurological:     General: No focal deficit present.     Mental Status: She is alert.     Comments: Some memory decline  Psychiatric:        Mood and Affect: Mood normal.        Thought Content: Thought content normal.       No results found for any visits on  04/25/20.  Assessment & Plan     1. Type 2 diabetes mellitus with stage 3 chronic kidney disease, without long-term current use of insulin, unspecified whether stage 3a or 3b CKD (HCC) Stable. Continue Metformin 538m daily. Will check labs as below and f/u pending results. Continue ACE-I and statin.  - CBC with Differential/Platelet - Comprehensive metabolic panel - Hemoglobin A1c - Lipid panel  2. Benign hypertension with CKD (chronic kidney disease) stage III (HCC) Elevated. Suspect patient may not have been taking Amlodipine as this has been filled by Cardiology and she has not seen them since 2019. Will restart Amlodipine. Continue Lisinopril 485m Will check labs as below and f/u pending results. F/U in 2-3 months.  - CBC with Differential/Platelet - Comprehensive metabolic panel - Hemoglobin A1c - Lipid panel - amLODipine (NORVASC) 5 MG tablet; Take 1 tablet (5 mg total) by mouth daily.  Dispense: 90 tablet; Refill: 3  3. Hypercholesteremia Stable. Continue Atorvastatin 8069mWill check labs as below and f/u pending results. - CBC with Differential/Platelet - Comprehensive metabolic panel - Hemoglobin A1c - Lipid panel  4. Need for influenza vaccination Flu vaccine given today without complication. Patient sat upright for 15 minutes to check for adverse reaction before being released. - Flu Vaccine QUAD High Dose(Fluad)   No follow-ups on file.      I, Reynolds BowlA-C, have reviewed all documentation for this visit. The documentation on 04/26/20 for the exam, diagnosis, procedures, and orders are all accurate and complete.   JenRubye BeachurPlantation General [phone]) [phone])  [place]

## 2020-04-25 ENCOUNTER — Ambulatory Visit (INDEPENDENT_AMBULATORY_CARE_PROVIDER_SITE_OTHER): Payer: Medicare Other | Admitting: Physician Assistant

## 2020-04-25 ENCOUNTER — Other Ambulatory Visit: Payer: Self-pay

## 2020-04-25 ENCOUNTER — Encounter: Payer: Self-pay | Admitting: Physician Assistant

## 2020-04-25 VITALS — BP 189/87 | HR 93 | Temp 98.3°F | Resp 16 | Ht 67.0 in | Wt 220.5 lb

## 2020-04-25 DIAGNOSIS — E7800 Pure hypercholesterolemia, unspecified: Secondary | ICD-10-CM

## 2020-04-25 DIAGNOSIS — E1122 Type 2 diabetes mellitus with diabetic chronic kidney disease: Secondary | ICD-10-CM

## 2020-04-25 DIAGNOSIS — Z23 Encounter for immunization: Secondary | ICD-10-CM

## 2020-04-25 DIAGNOSIS — N183 Chronic kidney disease, stage 3 unspecified: Secondary | ICD-10-CM

## 2020-04-25 DIAGNOSIS — I129 Hypertensive chronic kidney disease with stage 1 through stage 4 chronic kidney disease, or unspecified chronic kidney disease: Secondary | ICD-10-CM

## 2020-04-25 MED ORDER — AMLODIPINE BESYLATE 5 MG PO TABS: 5.0000 mg | ORAL_TABLET | Freq: Every day | ORAL | 3 refills | Status: DC

## 2020-04-26 LAB — COMPREHENSIVE METABOLIC PANEL
ALT: 12 IU/L (ref 0–32)
AST: 13 IU/L (ref 0–40)
Albumin/Globulin Ratio: 1.4 (ref 1.2–2.2)
Albumin: 3.9 g/dL (ref 3.6–4.6)
Alkaline Phosphatase: 169 IU/L — ABNORMAL HIGH (ref 44–121)
BUN/Creatinine Ratio: 9 — ABNORMAL LOW (ref 12–28)
BUN: 11 mg/dL (ref 8–27)
Bilirubin Total: 0.4 mg/dL (ref 0.0–1.2)
CO2: 22 mmol/L (ref 20–29)
Calcium: 8.6 mg/dL — ABNORMAL LOW (ref 8.7–10.3)
Chloride: 105 mmol/L (ref 96–106)
Creatinine, Ser: 1.2 mg/dL — ABNORMAL HIGH (ref 0.57–1.00)
GFR calc Af Amer: 47 mL/min/{1.73_m2} — ABNORMAL LOW (ref >59)
GFR calc non Af Amer: 41 mL/min/{1.73_m2} — ABNORMAL LOW (ref >59)
Globulin, Total: 2.7 g/dL (ref 1.5–4.5)
Glucose: 98 mg/dL (ref 65–99)
Potassium: 3.5 mmol/L (ref 3.5–5.2)
Sodium: 141 mmol/L (ref 134–144)
Total Protein: 6.6 g/dL (ref 6.0–8.5)

## 2020-04-26 LAB — CBC WITH DIFFERENTIAL/PLATELET
Basophils Absolute: 0.1 10*3/uL (ref 0.0–0.2)
Basos: 1 %
EOS (ABSOLUTE): 0.3 10*3/uL (ref 0.0–0.4)
Eos: 3 %
Hematocrit: 36.3 % (ref 34.0–46.6)
Hemoglobin: 12.4 g/dL (ref 11.1–15.9)
Immature Grans (Abs): 0 10*3/uL (ref 0.0–0.1)
Immature Granulocytes: 0 %
Lymphocytes Absolute: 2.4 10*3/uL (ref 0.7–3.1)
Lymphs: 25 %
MCH: 31.3 pg (ref 26.6–33.0)
MCHC: 34.2 g/dL (ref 31.5–35.7)
MCV: 92 fL (ref 79–97)
Monocytes Absolute: 0.9 10*3/uL (ref 0.1–0.9)
Monocytes: 9 %
Neutrophils Absolute: 6.1 10*3/uL (ref 1.4–7.0)
Neutrophils: 62 %
Platelets: 287 10*3/uL (ref 150–450)
RBC: 3.96 x10E6/uL (ref 3.77–5.28)
RDW: 13.4 % (ref 11.7–15.4)
WBC: 9.8 10*3/uL (ref 3.4–10.8)

## 2020-04-26 LAB — HEMOGLOBIN A1C
Est. average glucose Bld gHb Est-mCnc: 151 mg/dL
Hgb A1c MFr Bld: 6.9 % — ABNORMAL HIGH (ref 4.8–5.6)

## 2020-04-26 LAB — LIPID PANEL
Chol/HDL Ratio: 3.6 ratio (ref 0.0–4.4)
Cholesterol, Total: 153 mg/dL (ref 100–199)
HDL: 43 mg/dL (ref >39)
LDL Chol Calc (NIH): 81 mg/dL (ref 0–99)
Triglycerides: 169 mg/dL — ABNORMAL HIGH (ref 0–149)
VLDL Cholesterol Cal: 29 mg/dL (ref 5–40)

## 2020-05-19 ENCOUNTER — Other Ambulatory Visit: Payer: Self-pay | Admitting: Physician Assistant

## 2020-05-19 DIAGNOSIS — C50912 Malignant neoplasm of unspecified site of left female breast: Secondary | ICD-10-CM

## 2020-05-19 NOTE — Telephone Encounter (Signed)
Requested medications are due for refill today yes  Requested medications are on the active medication list yes  Last refill 10/6  Last visit Do not see this med/dx in an OV  Future visit scheduled No  Notes to clinic Please assess.

## 2020-06-07 ENCOUNTER — Other Ambulatory Visit: Payer: Self-pay | Admitting: Physician Assistant

## 2020-06-07 DIAGNOSIS — M810 Age-related osteoporosis without current pathological fracture: Secondary | ICD-10-CM

## 2020-07-17 ENCOUNTER — Other Ambulatory Visit: Payer: Self-pay | Admitting: Physician Assistant

## 2020-07-17 DIAGNOSIS — I129 Hypertensive chronic kidney disease with stage 1 through stage 4 chronic kidney disease, or unspecified chronic kidney disease: Secondary | ICD-10-CM

## 2020-07-24 ENCOUNTER — Other Ambulatory Visit: Payer: Self-pay | Admitting: Physician Assistant

## 2020-07-24 DIAGNOSIS — G4700 Insomnia, unspecified: Secondary | ICD-10-CM

## 2020-08-02 ENCOUNTER — Telehealth: Payer: Self-pay

## 2020-08-02 NOTE — Telephone Encounter (Signed)
Copied from [place] [id number]. Topic: General - Other >> Aug 02, 2020  2:27 PM Oneta Rack wrote: Osval[place] Human name: Shanon Brow  Relation to pt: [place] Call back number:[phone] fax [phone]   Reason for call:  Faxing over a request for gel for back pain and both wrist pain. Please confirm when you receive fax. Faxed again to  [phone].  Please use these codes and fax back, [phone].

## 2020-08-02 NOTE — Telephone Encounter (Signed)
Left message for pt to call back.  PEC please verify if pt really order a gel for her back and wrist pain.   Thanks,   -Mickel Baas

## 2020-08-07 ENCOUNTER — Other Ambulatory Visit: Payer: Self-pay | Admitting: Physician Assistant

## 2020-08-07 DIAGNOSIS — E7800 Pure hypercholesterolemia, unspecified: Secondary | ICD-10-CM

## 2020-08-08 NOTE — Telephone Encounter (Signed)
Anderson with [employer] is calling with the same request for gel for wrist and back and codes are [phone] AND [phone] and fax number is [phone]

## 2020-08-09 ENCOUNTER — Telehealth: Payer: Self-pay

## 2020-08-09 NOTE — Telephone Encounter (Signed)
Copied from [place] [phone]. Topic: General - Call Back - No Documentation >> Aug 08, 2020  1:55 PM Erick Blinks wrote: Reason for CRM: Janett Billow from [place] called reporting that they are missing office notes for this patient. They need the last office visit notes. Please advise Best contact: [phone]  Fax: [phone]

## 2020-08-10 NOTE — Telephone Encounter (Signed)
Office note has been faxed over to [place]. KW

## 2020-08-10 NOTE — Telephone Encounter (Signed)
Please review. KW 

## 2020-08-17 NOTE — Telephone Encounter (Signed)
I still have never received this fax

## 2020-10-03 ENCOUNTER — Other Ambulatory Visit: Payer: Self-pay | Admitting: Physician Assistant

## 2020-10-03 DIAGNOSIS — E1142 Type 2 diabetes mellitus with diabetic polyneuropathy: Secondary | ICD-10-CM

## 2020-10-15 ENCOUNTER — Emergency Department: Payer: Medicare Other

## 2020-10-15 ENCOUNTER — Other Ambulatory Visit: Payer: Self-pay

## 2020-10-15 ENCOUNTER — Inpatient Hospital Stay
Admission: EM | Admit: 2020-10-15 | Discharge: 2020-11-01 | DRG: 951 | Disposition: E | Payer: Medicare Other | Attending: Internal Medicine | Admitting: Internal Medicine

## 2020-10-15 DIAGNOSIS — N1832 Chronic kidney disease, stage 3b: Secondary | ICD-10-CM | POA: Diagnosis present

## 2020-10-15 DIAGNOSIS — I129 Hypertensive chronic kidney disease with stage 1 through stage 4 chronic kidney disease, or unspecified chronic kidney disease: Secondary | ICD-10-CM | POA: Diagnosis present

## 2020-10-15 DIAGNOSIS — C50912 Malignant neoplasm of unspecified site of left female breast: Secondary | ICD-10-CM | POA: Diagnosis present

## 2020-10-15 DIAGNOSIS — Z9012 Acquired absence of left breast and nipple: Secondary | ICD-10-CM

## 2020-10-15 DIAGNOSIS — Z515 Encounter for palliative care: Secondary | ICD-10-CM | POA: Diagnosis not present

## 2020-10-15 DIAGNOSIS — G9341 Metabolic encephalopathy: Secondary | ICD-10-CM | POA: Diagnosis present

## 2020-10-15 DIAGNOSIS — Z79811 Long term (current) use of aromatase inhibitors: Secondary | ICD-10-CM | POA: Diagnosis not present

## 2020-10-15 DIAGNOSIS — Z7983 Long term (current) use of bisphosphonates: Secondary | ICD-10-CM

## 2020-10-15 DIAGNOSIS — R0689 Other abnormalities of breathing: Secondary | ICD-10-CM | POA: Diagnosis not present

## 2020-10-15 DIAGNOSIS — G4730 Sleep apnea, unspecified: Secondary | ICD-10-CM | POA: Diagnosis present

## 2020-10-15 DIAGNOSIS — I639 Cerebral infarction, unspecified: Secondary | ICD-10-CM | POA: Diagnosis present

## 2020-10-15 DIAGNOSIS — M6282 Rhabdomyolysis: Secondary | ICD-10-CM | POA: Diagnosis present

## 2020-10-15 DIAGNOSIS — Z79899 Other long term (current) drug therapy: Secondary | ICD-10-CM

## 2020-10-15 DIAGNOSIS — I63512 Cerebral infarction due to unspecified occlusion or stenosis of left middle cerebral artery: Secondary | ICD-10-CM | POA: Diagnosis not present

## 2020-10-15 DIAGNOSIS — D509 Iron deficiency anemia, unspecified: Secondary | ICD-10-CM | POA: Diagnosis present

## 2020-10-15 DIAGNOSIS — R404 Transient alteration of awareness: Secondary | ICD-10-CM | POA: Diagnosis not present

## 2020-10-15 DIAGNOSIS — Z7982 Long term (current) use of aspirin: Secondary | ICD-10-CM

## 2020-10-15 DIAGNOSIS — Z823 Family history of stroke: Secondary | ICD-10-CM

## 2020-10-15 DIAGNOSIS — Z853 Personal history of malignant neoplasm of breast: Secondary | ICD-10-CM

## 2020-10-15 DIAGNOSIS — S0990XA Unspecified injury of head, initial encounter: Secondary | ICD-10-CM | POA: Diagnosis not present

## 2020-10-15 DIAGNOSIS — E1165 Type 2 diabetes mellitus with hyperglycemia: Secondary | ICD-10-CM | POA: Diagnosis present

## 2020-10-15 DIAGNOSIS — Z7984 Long term (current) use of oral hypoglycemic drugs: Secondary | ICD-10-CM

## 2020-10-15 DIAGNOSIS — Z043 Encounter for examination and observation following other accident: Secondary | ICD-10-CM | POA: Diagnosis not present

## 2020-10-15 DIAGNOSIS — Z20822 Contact with and (suspected) exposure to covid-19: Secondary | ICD-10-CM | POA: Diagnosis present

## 2020-10-15 DIAGNOSIS — N183 Chronic kidney disease, stage 3 unspecified: Secondary | ICD-10-CM | POA: Diagnosis present

## 2020-10-15 DIAGNOSIS — Z833 Family history of diabetes mellitus: Secondary | ICD-10-CM

## 2020-10-15 DIAGNOSIS — E1122 Type 2 diabetes mellitus with diabetic chronic kidney disease: Secondary | ICD-10-CM | POA: Diagnosis present

## 2020-10-15 DIAGNOSIS — E785 Hyperlipidemia, unspecified: Secondary | ICD-10-CM | POA: Diagnosis present

## 2020-10-15 DIAGNOSIS — Z8249 Family history of ischemic heart disease and other diseases of the circulatory system: Secondary | ICD-10-CM

## 2020-10-15 DIAGNOSIS — R29726 NIHSS score 26: Secondary | ICD-10-CM | POA: Diagnosis present

## 2020-10-15 DIAGNOSIS — Z791 Long term (current) use of non-steroidal anti-inflammatories (NSAID): Secondary | ICD-10-CM

## 2020-10-15 DIAGNOSIS — Z66 Do not resuscitate: Secondary | ICD-10-CM | POA: Diagnosis present

## 2020-10-15 DIAGNOSIS — R29810 Facial weakness: Secondary | ICD-10-CM | POA: Diagnosis not present

## 2020-10-15 DIAGNOSIS — I4891 Unspecified atrial fibrillation: Secondary | ICD-10-CM | POA: Diagnosis present

## 2020-10-15 DIAGNOSIS — M810 Age-related osteoporosis without current pathological fracture: Secondary | ICD-10-CM | POA: Diagnosis present

## 2020-10-15 DIAGNOSIS — S199XXA Unspecified injury of neck, initial encounter: Secondary | ICD-10-CM | POA: Diagnosis not present

## 2020-10-15 DIAGNOSIS — M47816 Spondylosis without myelopathy or radiculopathy, lumbar region: Secondary | ICD-10-CM | POA: Diagnosis not present

## 2020-10-15 DIAGNOSIS — R0902 Hypoxemia: Secondary | ICD-10-CM | POA: Diagnosis not present

## 2020-10-15 LAB — CBC WITH DIFFERENTIAL/PLATELET
Abs Immature Granulocytes: 0.16 10*3/uL — ABNORMAL HIGH (ref 0.00–0.07)
Basophils Absolute: 0.1 10*3/uL (ref 0.0–0.1)
Basophils Relative: 0 %
Eosinophils Absolute: 0 10*3/uL (ref 0.0–0.5)
Eosinophils Relative: 0 %
HCT: 42.5 % (ref 36.0–46.0)
Hemoglobin: 14 g/dL (ref 12.0–15.0)
Immature Granulocytes: 1 %
Lymphocytes Relative: 9 %
Lymphs Abs: 1.7 10*3/uL (ref 0.7–4.0)
MCH: 29.9 pg (ref 26.0–34.0)
MCHC: 32.9 g/dL (ref 30.0–36.0)
MCV: 90.6 fL (ref 80.0–100.0)
Monocytes Absolute: 1.9 10*3/uL — ABNORMAL HIGH (ref 0.1–1.0)
Monocytes Relative: 10 %
Neutro Abs: 15.6 10*3/uL — ABNORMAL HIGH (ref 1.7–7.7)
Neutrophils Relative %: 80 %
Platelets: 289 10*3/uL (ref 150–400)
RBC: 4.69 MIL/uL (ref 3.87–5.11)
RDW: 14.3 % (ref 11.5–15.5)
WBC: 19.3 10*3/uL — ABNORMAL HIGH (ref 4.0–10.5)
nRBC: 0 % (ref 0.0–0.2)

## 2020-10-15 LAB — CK: Total CK: 1274 U/L — ABNORMAL HIGH (ref 38–234)

## 2020-10-15 LAB — BLOOD GAS, VENOUS
Acid-Base Excess: 0.7 mmol/L (ref 0.0–2.0)
Bicarbonate: 26 mmol/L (ref 20.0–28.0)
O2 Saturation: 83.8 %
Patient temperature: 37
pCO2, Ven: 43 mmHg — ABNORMAL LOW (ref 44.0–60.0)
pH, Ven: 7.39 (ref 7.250–7.430)
pO2, Ven: 49 mmHg — ABNORMAL HIGH (ref 32.0–45.0)

## 2020-10-15 LAB — COMPREHENSIVE METABOLIC PANEL
ALT: 31 U/L (ref 0–44)
AST: 74 U/L — ABNORMAL HIGH (ref 15–41)
Albumin: 3.5 g/dL (ref 3.5–5.0)
Alkaline Phosphatase: 116 U/L (ref 38–126)
Anion gap: 12 (ref 5–15)
BUN: 27 mg/dL — ABNORMAL HIGH (ref 8–23)
CO2: 22 mmol/L (ref 22–32)
Calcium: 8.9 mg/dL (ref 8.9–10.3)
Chloride: 103 mmol/L (ref 98–111)
Creatinine, Ser: 1.36 mg/dL — ABNORMAL HIGH (ref 0.44–1.00)
GFR, Estimated: 38 mL/min — ABNORMAL LOW (ref >60)
Glucose, Bld: 150 mg/dL — ABNORMAL HIGH (ref 70–99)
Potassium: 4.4 mmol/L (ref 3.5–5.1)
Sodium: 137 mmol/L (ref 135–145)
Total Bilirubin: 1.4 mg/dL — ABNORMAL HIGH (ref 0.3–1.2)
Total Protein: 7.4 g/dL (ref 6.5–8.1)

## 2020-10-15 LAB — T4, FREE: Free T4: 1.29 ng/dL — ABNORMAL HIGH (ref 0.61–1.12)

## 2020-10-15 LAB — TROPONIN I (HIGH SENSITIVITY)
Troponin I (High Sensitivity): 27 ng/L — ABNORMAL HIGH (ref <18)
Troponin I (High Sensitivity): 26 ng/L — ABNORMAL HIGH (ref <18)

## 2020-10-15 LAB — BRAIN NATRIURETIC PEPTIDE: B Natriuretic Peptide: 170.5 pg/mL — ABNORMAL HIGH (ref 0.0–100.0)

## 2020-10-15 LAB — RESP PANEL BY RT-PCR (FLU A&B, COVID) ARPGX2
Influenza A by PCR: NEGATIVE
Influenza B by PCR: NEGATIVE
SARS Coronavirus 2 by RT PCR: NEGATIVE

## 2020-10-15 LAB — LACTIC ACID, PLASMA
Lactic Acid, Venous: 1.8 mmol/L (ref 0.5–1.9)
Lactic Acid, Venous: 2 mmol/L (ref 0.5–1.9)

## 2020-10-15 LAB — CBG MONITORING, ED: Glucose-Capillary: 145 mg/dL — ABNORMAL HIGH (ref 70–99)

## 2020-10-15 LAB — ETHANOL: Alcohol, Ethyl (B): <10 mg/dL (ref <10)

## 2020-10-15 LAB — TSH: TSH: 1.196 u[IU]/mL (ref 0.350–4.500)

## 2020-10-15 LAB — MAGNESIUM: Magnesium: 2.2 mg/dL (ref 1.7–2.4)

## 2020-10-15 MED ORDER — POLYVINYL ALCOHOL 1.4 % OP SOLN
1.0000 [drp] | Freq: Four times a day (QID) | OPHTHALMIC | Status: DC | PRN
  Filled 2020-10-15: qty 15

## 2020-10-15 MED ORDER — ACETAMINOPHEN 650 MG RE SUPP: 650.0000 mg | Freq: Four times a day (QID) | RECTAL | Status: DC | PRN

## 2020-10-15 MED ORDER — SODIUM CHLORIDE 0.9 % IV BOLUS
500.0000 mL | Freq: Once | INTRAVENOUS | Status: AC
  Administered 2020-10-15: 500 mL via INTRAVENOUS

## 2020-10-15 MED ORDER — ACETAMINOPHEN 325 MG PO TABS: 650.0000 mg | ORAL_TABLET | Freq: Four times a day (QID) | ORAL | Status: DC | PRN

## 2020-10-15 MED ORDER — DILTIAZEM HCL 25 MG/5ML IV SOLN
10.0000 mg | Freq: Once | INTRAVENOUS | Status: AC
  Administered 2020-10-15: 10 mg via INTRAVENOUS
  Filled 2020-10-15: qty 5

## 2020-10-15 MED ORDER — SODIUM CHLORIDE 0.9% FLUSH: 3.0000 mL | INTRAVENOUS | Status: DC | PRN

## 2020-10-15 MED ORDER — MORPHINE SULFATE (PF) 2 MG/ML IV SOLN
2.0000 mg | Freq: Once | INTRAVENOUS | Status: AC
  Administered 2020-10-15: 2 mg via INTRAVENOUS
  Filled 2020-10-15: qty 1

## 2020-10-15 MED ORDER — HALOPERIDOL 0.5 MG PO TABS
0.5000 mg | ORAL_TABLET | ORAL | Status: DC | PRN
  Filled 2020-10-15: qty 1

## 2020-10-15 MED ORDER — LORAZEPAM 2 MG/ML IJ SOLN
1.0000 mg | INTRAMUSCULAR | Status: DC | PRN
  Administered 2020-10-15: 1 mg via INTRAVENOUS
  Filled 2020-10-15: qty 1

## 2020-10-15 MED ORDER — HALOPERIDOL LACTATE 5 MG/ML IJ SOLN: 0.5000 mg | INTRAMUSCULAR | Status: DC | PRN

## 2020-10-15 MED ORDER — MORPHINE 100MG IN NS 100ML (1MG/ML) PREMIX INFUSION
1.0000 mg/h | INTRAVENOUS | Status: DC
  Administered 2020-10-15: 23:00:00 1 mg/h via INTRAVENOUS
  Administered 2020-10-19: 2 mg/h via INTRAVENOUS
  Filled 2020-10-15 (×2): qty 100

## 2020-10-15 MED ORDER — BIOTENE DRY MOUTH MT LIQD
15.0000 mL | OROMUCOSAL | Status: DC | PRN
  Filled 2020-10-15: qty 15

## 2020-10-15 MED ORDER — GLYCOPYRROLATE 1 MG PO TABS
1.0000 mg | ORAL_TABLET | ORAL | Status: DC | PRN
  Filled 2020-10-15: qty 1

## 2020-10-15 MED ORDER — ONDANSETRON 4 MG PO TBDP
4.0000 mg | ORAL_TABLET | Freq: Four times a day (QID) | ORAL | Status: DC | PRN
  Filled 2020-10-15: qty 1

## 2020-10-15 MED ORDER — ONDANSETRON HCL 4 MG/2ML IJ SOLN: 4.0000 mg | Freq: Four times a day (QID) | INTRAMUSCULAR | Status: DC | PRN

## 2020-10-15 MED ORDER — GLYCOPYRROLATE 0.2 MG/ML IJ SOLN
0.2000 mg | INTRAMUSCULAR | Status: DC | PRN
  Filled 2020-10-15: qty 1

## 2020-10-15 MED ORDER — SODIUM CHLORIDE 0.9% FLUSH
3.0000 mL | Freq: Two times a day (BID) | INTRAVENOUS | Status: DC
  Administered 2020-10-15 – 2020-10-18 (×7): 3 mL via INTRAVENOUS

## 2020-10-15 MED ORDER — LORAZEPAM 1 MG PO TABS: 1.0000 mg | ORAL_TABLET | ORAL | Status: DC | PRN

## 2020-10-15 MED ORDER — GLYCOPYRROLATE 0.2 MG/ML IJ SOLN
0.2000 mg | INTRAMUSCULAR | Status: DC | PRN
  Administered 2020-10-15: 19:00:00 0.2 mg via INTRAVENOUS
  Filled 2020-10-15 (×2): qty 1

## 2020-10-15 MED ORDER — HALOPERIDOL LACTATE 2 MG/ML PO CONC
0.5000 mg | ORAL | Status: DC | PRN
  Filled 2020-10-15: qty 0.3

## 2020-10-15 MED ORDER — LORAZEPAM 2 MG/ML PO CONC: 1.0000 mg | ORAL | Status: DC | PRN

## 2020-10-15 MED ORDER — SODIUM CHLORIDE 0.9 % IV SOLN: 250.0000 mL | INTRAVENOUS | Status: DC | PRN

## 2020-10-15 NOTE — Consult Note (Signed)
NEUROLOGY CONSULTATION NOTE   Date of service: Oct 15, 2020 Patient Name: Robin Ashley MRN:  [id number] DOB:  29-Jul-1931 Reason for consult: large acute L MCA ischemic infarct _ _ _   _ __   _ __ _ _  __ __   _ __   __ _  History of Present Illness   Robin Ashley is a 87 y.o. female with medical history significant for diabetes mellitus, dyslipidemia, hypertension, history of breast cancer who was brought into the ER by EMS for evaluation of unresponsiveness and found to have large acute L MCA stroke.  Patient's last known well time was yesterday morning at around 10:30 AM when her daughter spoke to her on the phone. Family usually checks on the patient on Sundays and went to see her this morning and found the patient on the floor unresponsive so EMS was called.  It is unclear how long the patient was down for. EMS her blood sugar was 291 and she was unresponsive to both verbal and painful stimuli. CTH showed very large acute L MCA ischemic infarct. Patient outside the window for tPA or intervention. Patient unconscious with labored breathing. NIHSS = 26  ROS   UTA 2/2 encephalopathy  Past History   Past Medical History:  Diagnosis Date  . 174.4 03/2013   Left breast, T1c, N0, 12 mm; ER PR positive, HER-2/neu not over expressed. Not a candidate for adjuvant chemotherapy per [facility] tumor board.  . Diabetes mellitus without complication ([place])   . Foot drop, right October 2014   noted post mastectomy, conservative treatment was instituted with resolution, mild edema.  . Hyperlipidemia   . Hypertension   . Sleep apnea    Past Surgical History:  Procedure Laterality Date  . BREAST BIOPSY Left 02/25/13   positive  . BREAST SURGERY Left 03-15-13   left mastectomywith SN biopsy  . COLON SURGERY  1991   Likely segmental resection for diverticulitis based on patient description.  . COLONOSCOPY    . EYE SURGERY Bilateral 04/2012  . intestinal balloon removed  1991   Likely surgery  for diverticulitis.  Marland Kitchen MASTECTOMY Left 2014   positive  . 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   Family History  Problem Relation Age of Onset  . Cancer Brother        colon  . Diabetes Brother   . Heart disease Brother   . Diabetes Brother   . Lung cancer Son   . Breast cancer Neg Hx    Social History   Socioeconomic History  . Marital status: Widowed    Spouse name: Not on file  . Number of children: 5  . Years of education: Not on file  . Highest education level: 12th grade  Occupational History  . Occupation: retired  Tobacco Use  . Smoking status: Never Smoker  . Smokeless tobacco: Never Used  Vaping Use  . Vaping Use: Never used  Substance and Sexual Activity  . Alcohol use: No  . Drug use: No  . Sexual activity: Never  Other Topics Concern  . Not on file  Social History Narrative  . Not on file   Social Determinants of Health   Financial Resource Strain: Low Risk   . Difficulty of Paying Living Expenses: Not hard at all  Food Insecurity: No Food Insecurity  . Worried About Charity fundraiser in the Last Year: Never true  . Ran  Out of Food in the Last Year: Never true  Transportation Needs: No Transportation Needs  . Lack of Transportation (Medical): No  . Lack of Transportation (Non-Medical): No  Physical Activity: Inactive  . Days of Exercise per Week: 0 days  . Minutes of Exercise per Session: 0 min  Stress: No Stress Concern Present  . Feeling of Stress : Not at all  Social Connections: Moderately Integrated  . Frequency of Communication with Friends and Family: More than three times a week  . Frequency of Social Gatherings with Friends and Family: More than three times a week  . Attends Religious Services: More than 4 times per year  . Active Member of Clubs or Organizations: Yes  . Attends Archivist Meetings: Never  . Marital Status: Widowed   No Known Allergies  Medications    (Not in a hospital admission)    Vitals   Vitals:   10/24/2020 1100 10/10/2020 1122 10/07/2020 1130 10/05/2020 1138  BP: (!) 151/125 (!) 169/91 127/72   Pulse:  (!) 120 (!) 108   Resp: 19 19 (!) 21 (!) 21  SpO2:  100% 99%   Weight:      Height:         Body mass index is 32.31 kg/m.  Physical Exam   Physical Exam Gen: obtunded, not arousable to noxious stimuli, does not open eyes, does not follow commands Resp: labored breathing CV: RRR  Neuro: GCS 6 E1 V1 M4 *MS: Gen: obtunded, not arousable to noxious stimuli, does not open eyes, does not follow commands *Speech: no effort to speak *CN: PERRL, (+) corneals, oculocephalics, cough, gag *Motor & sensory: withdrawal to noxious stimuli on L side only *Reflexes:  1+ and symmetric throughout without clonus; toes down-going bilat *Gait: UTA  NIHSS = 26  Premorbid mRS = 2   Labs   CBC:  Recent Labs  Lab 10/18/2020 1035  WBC 19.3*  NEUTROABS 15.6*  HGB 14.0  HCT 42.5  MCV 90.6  PLT 450    Basic Metabolic Panel:  Lab Results  Component Value Date   NA 137 10/06/2020   K 4.4 10/27/2020   CO2 22 10/01/2020   GLUCOSE 150 (H) 10/05/2020   BUN 27 (H) 10/30/2020   CREATININE 1.36 (H) 10/22/2020   CALCIUM 8.9 10/16/2020   GFRNONAA 38 (L) 10/14/2020   GFRAA 47 (L) 04/25/2020   Lipid Panel:  Lab Results  Component Value Date   LDLCALC 81 04/25/2020   HgbA1c:  Lab Results  Component Value Date   HGBA1C 6.9 (H) 04/25/2020   Urine Drug Screen: No results found for: LABOPIA, COCAINSCRNUR, LABBENZ, AMPHETMU, THCU, LABBARB  Alcohol Level     Component Value Date/Time   ETH <10 10/22/2020 1035     Impression   [name] is a 87 y.o. female with medical history significant for diabetes mellitus, dyslipidemia, hypertension, history of breast cancer who was brought into the ER by EMS for evaluation of unresponsiveness and found to have large acute L MCA stroke.    Patient's prognosis discussed with  daughter at bedside and additional 4 family members. I told them I doubted the stroke would be survivable and in the unlikely event she did survive it she would have major deficits, likely hemiplegic and unable to communicate. Family clearly stated patient would not want to live if she could not be independent and decided to make her DNAR status and proceed with comfort care measures only.   Recommendations   -  Family has elected to make patient DNAR status - Admit to hospitalist service for comfort care measures only  Neurology will not continue to actively follow but please re-engage if additional questions arise. ______________________________________________________________________   Thank you for the opportunity to take part in the care of this patient. If you have any further questions, please contact the neurology consultation attending.  Signed,  Su Monks, MD [facility] [phone]  If 7pm- 7am, please page neurology on call as listed in [place].

## 2020-10-15 NOTE — ED Triage Notes (Signed)
Pt arrives via EMS from home after family found her on the floor this morning- pt was last spoken to yesterday morning by family family always comes to check on pt on Sundays- pt has an unknown down time- pt cbg was 291 per EMS- pt continues to be unresponsive to voice, touch, and pain

## 2020-10-15 NOTE — Progress Notes (Signed)
Pt medically unable to assist with admission profile. Pt is comfort care and unresponsive.

## 2020-10-15 NOTE — ED Notes (Signed)
Neurologist at bedside. 

## 2020-10-15 NOTE — H&P (Addendum)
History and Physical    Robin Ashley LKJ:[id number] DOB: 02-25-1932 DOA: 10/03/2020  PCP: Mar Daring, PA-C   Patient coming from: Home  I have personally briefly reviewed patient's old medical records in [place]  Chief Complaint: Change in mental status  HPI: Robin Ashley is a 87 y.o. female with medical history significant for diabetes mellitus, dyslipidemia, hypertension, history of breast cancer who was brought into the ER by EMS for evaluation of unresponsiveness.  Patient's last known well time was yesterday morning at around 10:30 AM when her daughter spoke to her on the phone. Family usually checks on the patient on Sundays and went to see her this morning and found the patient on the floor unresponsive so EMS was called.  It is unclear how long the patient was down for. EMS her blood sugar was 291 and she was unresponsive to both verbal and painful stimuli. I am unable to do a review of systems on this patient due to her AMS. Venous blood gas 7.39/43/49/26/83 Sodium 137, potassium 4.4, chloride 103, bicarb 22, 50, BUN 27, creatinine 1.36, calcium 8.9, magnesium 2.2, alkaline phosphatase 116, albumin 3.5, AST 74 ALT 31, total protein 7.4 total bilirubin 1.4, BNP 170, CK total CK1274, troponin 26, lactic acid 2.0, white count 19.3, hemoglobin 14.0, hematocrit 42.5, MCV 90.6, RDW 14.3, platelet count 289, TSH 1.19, free T4 1.29 Respiratory viral panel is negative CT scan of the head without contrast/cervical spine CT shows Large acute left MCA territory infarct. Smaller and more remote right occipital cortex infarct. No hemorrhage or cervical spine fracture. Right hip x-ray shows remote fractures of the RIGHT superior and inferior pubic rami. No evidence for acute abnormality. Chest x-ray reviewed by me shows no evidence for acute cardiopulmonary abnormality. Suspect remote fractures of the LEFT ribs. If there is clinical evidence for acute fracture, consider  rib series. Twelve-lead EKG reviewed by me shows A. fib with a rapid ventricular rate.  ED Course: Patient is an 87 year old Caucasian female who was brought into the ER via EMS after she was found unresponsive by family members on the floor of her home.  It is unclear how long she was down for her and her last known well time was 10:30 AM on the morning prior to admission. Imaging shows a large left MCA stroke.  Patient was outside the window for tPA and not a candidate for any intervention. ER physician/neurologist discussed with patient's daughter and healthcare power of attorney measures and does not want any aggressive treatment at this time.    Review of Systems: As per HPI otherwise all other systems reviewed and negative.   Past Medical History:  Diagnosis Date  . 174.4 03/2013   Left breast, T1c, N0, 12 mm; ER PR positive, HER-2/neu not over expressed. Not a candidate for adjuvant chemotherapy per [facility] tumor board.  . Diabetes mellitus without complication ([place])   . Foot drop, right October 2014   noted post mastectomy, conservative treatment was instituted with resolution, mild edema.  . Hyperlipidemia   . Hypertension   . Sleep apnea     Past Surgical History:  Procedure Laterality Date  . BREAST BIOPSY Left 02/25/13   positive  . BREAST SURGERY Left 03-15-13   left mastectomywith SN biopsy  . COLON SURGERY  1991   Likely segmental resection for diverticulitis based on patient description.  . COLONOSCOPY    . EYE SURGERY Bilateral 04/2012  . intestinal balloon removed  1991  Likely surgery for diverticulitis.  Marland Kitchen MASTECTOMY Left 2014   positive  . 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     reports that she has never smoked. She has never used smokeless tobacco. She reports that she does not drink alcohol and does not use drugs.  No Known Allergies  Family History  Problem Relation Age of Onset  . Cancer  Brother        colon  . Diabetes Brother   . Heart disease Brother   . Diabetes Brother   . Lung cancer Son   . Breast cancer Neg Hx       Prior to Admission medications   Medication Sig Start Date End Date Taking? Authorizing Provider  alendronate (FOSAMAX) 70 MG tablet TAKE 1 TABLET EVERY 7 DAYS WITH A FULL GLASS OF WATER ON AN EMPTY STOMACH 06/07/20   Mar Daring, PA-C  amLODipine (NORVASC) 5 MG tablet Take 1 tablet (5 mg total) by mouth daily. 04/25/20   Mar Daring, PA-C  aspirin 81 MG tablet Take 1 tablet (81 mg total) by mouth daily. 06/19/15   Margarita Rana, MD  atorvastatin (LIPITOR) 80 MG tablet TAKE 1 TABLET DAILY 08/07/20   Mar Daring, PA-C  ferrous sulfate 325 (65 FE) MG tablet Iron (ferrous sulfate) 325 mg (65 mg iron) tablet  TAKE 1 TABLET BY MOUTH TWICE A DAY    [provider]  furosemide (LASIX) 20 MG tablet TAKE 1 TABLET DAILY 02/16/18   Mar Daring, PA-C  letrozole [facility]) 2.5 MG tablet TAKE 1 TABLET DAILY 05/19/20   Mar Daring, PA-C  lisinopril (ZESTRIL) 40 MG tablet TAKE 1 TABLET DAILY 07/17/20   Mar Daring, PA-C  meloxicam (MOBIC) 15 MG tablet TAKE 1 TABLET DAILY 05/19/20   Mar Daring, PA-C  metFORMIN (GLUCOPHAGE) 500 MG tablet TAKE 1 TABLET DAILY WITH BREAKFAST 10/03/20   Virginia Crews, MD  Multiple Vitamins-Minerals (CENTRUM SILVER PO) Take 1 tablet by mouth daily.    [provider]  omeprazole (PRILOSEC) 20 MG capsule TAKE 1 CAPSULE DAILY 07/22/18   Byrnett, Forest Gleason, MD  Omeprazole 20 MG TBEC omeprazole 20 mg capsule,delayed release    [provider]  polyethylene glycol powder (GLYCOLAX/MIRALAX) powder Take by mouth daily.  03/24/14   [provider]  traZODone (DESYREL) 50 MG tablet TAKE 1 TABLET EVERY EVENING (NEED APPOINTMENT) 07/24/20   Mar Daring, [place]    Physical Exam: Vitals:   10/16/2020 1100 10/29/2020 1122 10/27/2020 1130 10/11/2020 1138  BP:  (!) 151/125 (!) 169/91 127/72   Pulse:  (!) 120 (!) 108   Resp: 19 19 (!) 21 (!) 21  SpO2:  100% 99%   Weight:      Height:         Vitals:   10/24/2020 1100 10/31/2020 1122 10/04/2020 1130 10/18/2020 1138  BP: (!) 151/125 (!) 169/91 127/72   Pulse:  (!) 120 (!) 108   Resp: 19 19 (!) 21 (!) 21  SpO2:  100% 99%   Weight:      Height:          Constitutional:  Unresponsive to loud verbal stimuli/painful stimuli HEENT:      Head: Normocephalic and atraumatic.         Eyes: PERLA, EOMI, Conjunctivae are normal. Sclera is non-icteric.       Mouth/Throat: Mucous membranes are moist.  Neck: Supple with no signs of meningismus. Cardiovascular:  Irregularly irregular, tachycardic. No murmurs, gallops, or rubs. 2+ symmetrical distal pulses are present . No JVD. No LE edema Respiratory:  Sonorous respiration.bilateral air entry  Gastrointestinal: Soft, non tender, and non distended with positive bowel sounds.  Central adiposity Genitourinary: No CVA tenderness. Musculoskeletal: Nontender with normal range of motion in all extremities. No cyanosis, or erythema of extremities. Neurologic: Unresponsive to loud verbal or painful stimuli, no spontaneous movement of her extremities Skin: Skin is warm, dry.  No rash or ulcers Psychiatric: Unable to assess   Labs on Admission: I have personally reviewed following labs and imaging studies  CBC: Recent Labs  Lab 10/13/2020 1035  WBC 19.3*  NEUTROABS 15.6*  HGB 14.0  HCT 42.5  MCV 90.6  PLT 540   Basic Metabolic Panel: Recent Labs  Lab 10/02/2020 1035  NA 137  K 4.4  CL 103  CO2 22  GLUCOSE 150*  BUN 27*  CREATININE 1.36*  CALCIUM 8.9  MG 2.2   GFR: Estimated Creatinine Clearance: 34.9 mL/min (A) (by C-G formula based on SCr of 1.36 mg/dL (H)). Liver Function Tests: Recent Labs  Lab 10/02/2020 1035  AST 74*  ALT 31  ALKPHOS 116  BILITOT 1.4*  PROT 7.4  ALBUMIN 3.5   No results for input(s): LIPASE, AMYLASE in the last  168 hours. No results for input(s): AMMONIA in the last 168 hours. Coagulation Profile: No results for input(s): INR, PROTIME in the last 168 hours. Cardiac Enzymes: Recent Labs  Lab 10/06/2020 1035  CKTOTAL 1,274*   BNP (last 3 results) No results for input(s): PROBNP in the last 8760 hours. HbA1C: No results for input(s): HGBA1C in the last 72 hours. CBG: Recent Labs  Lab 10/12/2020 1032  GLUCAP 145*   Lipid Profile: No results for input(s): CHOL, HDL, LDLCALC, TRIG, CHOLHDL, LDLDIRECT in the last 72 hours. Thyroid Function Tests: Recent Labs    10/06/2020 1035  TSH 1.196  FREET4 1.29*   Anemia Panel: No results for input(s): VITAMINB12, FOLATE, FERRITIN, TIBC, IRON, RETICCTPCT in the last 72 hours. Urine analysis: No results found for: COLORURINE, APPEARANCEUR, [place], [place], GLUCOSEU, HGBUR, BILIRUBINUR, KETONESUR, PROTEINUR, UROBILINOGEN, NITRITE, LEUKOCYTESUR  Radiological Exams on Admission: CT Head Wo Contrast  Result Date: 10/02/2020 CLINICAL DATA:  Code stroke.  Neck trauma.  Found down. EXAM: CT HEAD WITHOUT CONTRAST CT CERVICAL SPINE WITHOUT CONTRAST TECHNIQUE: Multidetector CT imaging of the head and cervical spine was performed following the standard protocol without intravenous contrast. Multiplanar CT image reconstructions of the cervical spine were also generated. COMPARISON:  None FINDINGS: CT HEAD FINDINGS Brain: Extensive acute infarction in the left MCA territory affecting both upper and lower divisions. There is also involvement of the deep white matter and putamen. The infarcts are well established but recent appearing. More low-density right occipital cortex infarct which is likely subacute to chronic. No hemorrhage, hydrocephalus, or worrisome mass effect. Vascular: No hyperdense vessel or unexpected calcification. Skull: Negative for fracture Sinuses/Orbits: Bilateral cataract resection CT CERVICAL SPINE FINDINGS Alignment: Normal Skull base and vertebrae:  No acute fracture Soft tissues and spinal canal: No prevertebral fluid or swelling. No visible canal hematoma. Disc levels:  Unremarkable for age Upper chest: No evidence of injury These results were called by telephone at the time of interpretation on 10/21/2020 at 11:28 am to provider [facility] , who verbally acknowledged these results. IMPRESSION: 1. Large acute left MCA territory infarct. 2. Smaller and more remote right occipital cortex  infarct. 3. No hemorrhage or cervical spine fracture. Electronically Signed   By: Monte Fantasia M.D.   On: 10/16/2020 11:32   CT Cervical Spine Wo Contrast  Result Date: 10/18/2020 CLINICAL DATA:  Code stroke.  Neck trauma.  Found down. EXAM: CT HEAD WITHOUT CONTRAST CT CERVICAL SPINE WITHOUT CONTRAST TECHNIQUE: Multidetector CT imaging of the head and cervical spine was performed following the standard protocol without intravenous contrast. Multiplanar CT image reconstructions of the cervical spine were also generated. COMPARISON:  None FINDINGS: CT HEAD FINDINGS Brain: Extensive acute infarction in the left MCA territory affecting both upper and lower divisions. There is also involvement of the deep white matter and putamen. The infarcts are well established but recent appearing. More low-density right occipital cortex infarct which is likely subacute to chronic. No hemorrhage, hydrocephalus, or worrisome mass effect. Vascular: No hyperdense vessel or unexpected calcification. Skull: Negative for fracture Sinuses/Orbits: Bilateral cataract resection CT CERVICAL SPINE FINDINGS Alignment: Normal Skull base and vertebrae: No acute fracture Soft tissues and spinal canal: No prevertebral fluid or swelling. No visible canal hematoma. Disc levels:  Unremarkable for age Upper chest: No evidence of injury These results were called by telephone at the time of interpretation on 10/01/2020 at 11:28 am to provider [facility] , who verbally acknowledged these results. IMPRESSION: 1.  Large acute left MCA territory infarct. 2. Smaller and more remote right occipital cortex infarct. 3. No hemorrhage or cervical spine fracture. Electronically Signed   By: Monte Fantasia M.D.   On: 10/14/2020 11:32   DG Chest Portable 1 View  Result Date: 10/26/2020 CLINICAL DATA:  Fall.  Found on the floor this morning. EXAM: PORTABLE CHEST 1 VIEW COMPARISON:  None. FINDINGS: Patient is slightly rotated towards the LEFT, accentuating the heart size. No focal consolidations or pleural effusions. No pulmonary edema. There is deformity of some of the LEFT-sided ribs, favoring remote fractures. IMPRESSION: 1.  No evidence for acute cardiopulmonary abnormality. 2. Suspect remote fractures of the LEFT ribs. If there is clinical evidence for acute fracture, consider rib series. Electronically Signed   By: Nolon Nations M.D.   On: 10/09/2020 11:49   DG Hip Unilat W or Wo Pelvis 2-3 Views Left  Result Date: 10/02/2020 CLINICAL DATA:  Fall.  Found on the floor this morning. EXAM: DG HIP (WITH OR WITHOUT PELVIS) 2-3V LEFT COMPARISON:  None. FINDINGS: There is no acute fracture or subluxation. Remote fractures of the RIGHT superior and inferior pubic rami. There are degenerative changes in the LOWER lumbar spine. IMPRESSION: No evidence for acute abnormality. Remote fractures of the RIGHT superior and inferior pubic rami. Electronically Signed   By: Nolon Nations M.D.   On: 10/16/2020 11:50   DG Hip Unilat W or Wo Pelvis 2-3 Views Right  Result Date: 10/18/2020 CLINICAL DATA:  Fall.  Found on the floor this morning. EXAM: DG HIP (WITH OR WITHOUT PELVIS) 2-3V RIGHT COMPARISON:  09/27/2016 FINDINGS: Deformity of the RIGHT inferior and superior pubic rami are consistent with remote fracture. There is no acute fracture or subluxation. IMPRESSION: Remote fractures of the RIGHT superior and inferior pubic rami. No evidence for acute abnormality. Electronically Signed   By: Nolon Nations M.D.   On: 10/14/2020  11:46     Assessment/Plan Principal Problem:   Acute CVA (cerebrovascular accident) ([place]) Active Problems:   Chronic kidney disease (CKD), stage III (moderate) (HCC)   Atrial fibrillation with RVR (HCC)   Rhabdomyolysis    Patient is an 87 year old Caucasian  female who was found unresponsive at home with an unclear downtime and is found to have a large left MCA territory stroke as well as rapid atrial fibrillation.  Patient is outside the window for tPA. Family does not want any aggressive measures and has requested comfort measures. Patient has been admitted to the hospital with comfort measures.  DVT prophylaxis: None Code Status: DO NOT RESUSCITATE Family Communication: Greater than 50% of time was spent discussing patient's condition and plan of care with her daughter at the bedside.  All questions and concerns have been addressed.  Family has requested comfort measures. Disposition Plan: Back to previous home environment Consults called: none Status: At the time of admission, it appears that the appropriate admission status for this patient is inpatient. This is judged to be reasonable and necessary in order to provide the required intensity of service to ensure the patient's safety given the presenting symptoms, physical exam findings, and initial radiographic and laboratory data in the context of their comorbid conditions. Patient requires inpatient status due to high intensity of service, high risk for further deterioration and high frequency of surveillance required.    Collier Bullock MD [facility]     10/02/2020, 2:01 PM

## 2020-10-15 NOTE — ED Provider Notes (Signed)
[facility] Emergency Department Provider Note  ____________________________________________   Event Date/Time   First MD Initiated Contact with Patient 10/13/2020 1029     (approximate)  I have reviewed the triage vital signs and the nursing notes.   HISTORY  Chief Complaint Altered Mental Status    HPI Robin Ashley is a 87 y.o. female with hypertension, hyperlipidemia, diabetes who comes in for altered mental status.  Family last talked to patient yesterday morning.  Have not seen her since.  Patient was found down on the ground this morning by family and EMS was called.  Patient is altered and nonverbal.  Has.  Spontaneous moves everything except for her right upper arm.  Does not follow any commands.  According to EMS patient looked like she had been down for a very long time.  Unable to get full HPI due to patient's altered mental status     Past Medical History:  Diagnosis Date  . 174.4 03/2013   Left breast, T1c, N0, 12 mm; ER PR positive, HER-2/neu not over expressed. Not a candidate for adjuvant chemotherapy per [facility] tumor board.  . Diabetes mellitus without complication ([place])   . Foot drop, right October 2014   noted post mastectomy, conservative treatment was instituted with resolution, mild edema.  . Hyperlipidemia   . Hypertension   . Sleep apnea     Patient Active Problem List   Diagnosis Date Noted  . Osteoporosis 05/01/2017  . Postmenopausal bone loss 03/19/2017  . Pubic ramus fracture ([place]) 09/27/2016  . Lymphedema 08/21/2016  . Osteoarthritis of left shoulder 12/22/2015  . Paresthesia and pain of right extremity 06/19/2015  . Diverticulosis 02/17/2015  . Adaptation reaction 02/16/2015  . At risk for falling 02/16/2015  . Benign hypertension with CKD (chronic kidney disease) stage III ([place]) 02/16/2015  . Adult BMI 30+ 02/16/2015  . Chronic kidney disease (CKD), stage III (moderate) ([place]) 02/16/2015  . CN (constipation)  02/16/2015  . Diabetes ([place]) 02/16/2015  . Accumulation of fluid in tissues 02/16/2015  . H/O gastric ulcer 02/16/2015  . Hypercholesteremia 02/16/2015  . Insomnia 02/16/2015  . Gonalgia 02/16/2015  . Cramps of lower extremity 02/16/2015  . Fungal infection of toenail 02/16/2015  . Pain in shoulder 02/16/2015  . Arthritis 11/22/2014  . Helicobacter pylori gastrointestinal tract infection 05/18/2014  . Anemia, iron deficiency 03/27/2014  . Bilateral lower extremity edema 02/22/2014  . History of breast cancer 03/04/2013    Past Surgical History:  Procedure Laterality Date  . BREAST BIOPSY Left 02/25/13   positive  . BREAST SURGERY Left 03-15-13   left mastectomywith SN biopsy  . COLON SURGERY  1991   Likely segmental resection for diverticulitis based on patient description.  . COLONOSCOPY    . EYE SURGERY Bilateral 04/2012  . intestinal balloon removed  1991   Likely surgery for diverticulitis.  Marland Kitchen MASTECTOMY Left 2014   positive  . 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    Prior to Admission medications   Medication Sig Start Date End Date Taking? Authorizing Provider  alendronate (FOSAMAX) 70 MG tablet TAKE 1 TABLET EVERY 7 DAYS WITH A FULL GLASS OF WATER ON AN EMPTY STOMACH 06/07/20   Mar Daring, PA-C  amLODipine (NORVASC) 5 MG tablet Take 1 tablet (5 mg total) by mouth daily. 04/25/20   Mar Daring, PA-C  aspirin 81 MG tablet Take 1 tablet (81 mg total) by mouth daily.  06/19/15   Margarita Rana, MD  atorvastatin (LIPITOR) 80 MG tablet TAKE 1 TABLET DAILY 08/07/20   Mar Daring, PA-C  ferrous sulfate 325 (65 FE) MG tablet Iron (ferrous sulfate) 325 mg (65 mg iron) tablet  TAKE 1 TABLET BY MOUTH TWICE A DAY    [provider]  furosemide (LASIX) 20 MG tablet TAKE 1 TABLET DAILY 02/16/18   Mar Daring, PA-C  letrozole [facility]) 2.5 MG tablet TAKE 1 TABLET DAILY 05/19/20   Mar Daring, PA-C  lisinopril (ZESTRIL) 40 MG tablet TAKE 1 TABLET DAILY 07/17/20   Mar Daring, PA-C  meloxicam (MOBIC) 15 MG tablet TAKE 1 TABLET DAILY 05/19/20   Mar Daring, PA-C  metFORMIN (GLUCOPHAGE) 500 MG tablet TAKE 1 TABLET DAILY WITH BREAKFAST 10/03/20   Virginia Crews, MD  Multiple Vitamins-Minerals (CENTRUM SILVER PO) Take 1 tablet by mouth daily.    [provider]  omeprazole (PRILOSEC) 20 MG capsule TAKE 1 CAPSULE DAILY 07/22/18   Byrnett, Forest Gleason, MD  Omeprazole 20 MG TBEC omeprazole 20 mg capsule,delayed release    [provider]  polyethylene glycol powder (GLYCOLAX/MIRALAX) powder Take by mouth daily.  03/24/14   [provider]  traZODone (DESYREL) 50 MG tablet TAKE 1 TABLET EVERY EVENING (NEED APPOINTMENT) 07/24/20   Mar Daring, PA-C    Allergies Patient has no known allergies.  Family History  Problem Relation Age of Onset  . Cancer Brother        colon  . Diabetes Brother   . Heart disease Brother   . Diabetes Brother   . Lung cancer Son   . Breast cancer Neg Hx     Social History Social History   Tobacco Use  . Smoking status: Never Smoker  . Smokeless tobacco: Never Used  Vaping Use  . Vaping Use: Never used  Substance Use Topics  . Alcohol use: No  . Drug use: No      Review of Systems Unable to get full review of systems due to patient's altered mental status ____________________________________________   PHYSICAL EXAM:  VITAL SIGNS: Blood pressure 121/81, pulse (!) 133, resp. rate (!) 23, height _0  (1.702 m), weight 93.6 kg, SpO2 91 %.  Constitutional: Altered, nonverbal Eyes: Conjunctivae are normal.  Pupils = Head: Atraumatic. Nose: No congestion/rhinnorhea. Mouth/Throat: Mucous membranes are moist.   Neck: No stridor. Trachea Midline. FROM Cardiovascular: Irregular, tachycardic grossly normal heart sounds.  Good peripheral circulation. Respiratory: Normal respiratory  effort.  No retractions. Lungs CTAB. Gastrointestinal: Soft and nontender. No distention. No abdominal bruits.  Musculoskeletal: No lower extremity tenderness nor edema.  No joint effusions. Neurologic: Spontaneously does move everything but her right arm but does not follow commands and is nonverbal Skin:  Skin is warm, dry and intact. No rash noted. Psychiatric: Unable to assess due to altered mental status GU: Deferred   ____________________________________________   LABS (all labs ordered are listed, but only abnormal results are displayed)  Labs Reviewed  CBC WITH DIFFERENTIAL/PLATELET - Abnormal; Notable for the following components:      Result Value   WBC 19.3 (*)    Neutro Abs 15.6 (*)    Monocytes Absolute 1.9 (*)    Abs Immature Granulocytes 0.16 (*)    All other components within normal limits  COMPREHENSIVE METABOLIC PANEL - Abnormal; Notable for the following components:   Glucose, Bld 150 (*)    BUN 27 (*)    Creatinine, Ser 1.36 (*)  AST 74 (*)    Total Bilirubin 1.4 (*)    GFR, Estimated 38 (*)    All other components within normal limits  CK - Abnormal; Notable for the following components:   Total CK 1,274 (*)    All other components within normal limits  BLOOD GAS, VENOUS - Abnormal; Notable for the following components:   pCO2, Ven 43 (*)    pO2, Ven 49.0 (*)    All other components within normal limits  T4, FREE - Abnormal; Notable for the following components:   Free T4 1.29 (*)    All other components within normal limits  BRAIN NATRIURETIC PEPTIDE - Abnormal; Notable for the following components:   B Natriuretic Peptide 170.5 (*)    All other components within normal limits  LACTIC ACID, PLASMA - Abnormal; Notable for the following components:   Lactic Acid, Venous 2.0 (*)    All other components within normal limits  CBG MONITORING, ED - Abnormal; Notable for the following components:   Glucose-Capillary 145 (*)    All other components within  normal limits  TROPONIN I (HIGH SENSITIVITY) - Abnormal; Notable for the following components:   Troponin I (High Sensitivity) 27 (*)    All other components within normal limits  TROPONIN I (HIGH SENSITIVITY) - Abnormal; Notable for the following components:   Troponin I (High Sensitivity) 26 (*)    All other components within normal limits  RESP PANEL BY RT-PCR (FLU A&B, COVID) ARPGX2  CULTURE, BLOOD (ROUTINE X 2)  CULTURE, BLOOD (ROUTINE X 2)  ETHANOL  TSH  MAGNESIUM  LACTIC ACID, PLASMA  URINE DRUG SCREEN, QUALITATIVE ([facility] ONLY)  URINALYSIS, COMPLETE (UACMP) WITH MICROSCOPIC   ____________________________________________   ED ECG REPORT I, Vanessa Compton, the attending physician, personally viewed and interpreted this ECG.  Atrial fibrillation rate of 142, no ST elevation, no T wave versions, normal intervals ____________________________________________  RADIOLOGY Robert Bellow, personally viewed and evaluated these images (plain radiographs) as part of my medical decision making, as well as reviewing the written report by the radiologist.  ED MD interpretation: No fractures noted  Official radiology report(s): CT Head Wo Contrast  Result Date: 10/14/2020 CLINICAL DATA:  Code stroke.  Neck trauma.  Found down. EXAM: CT HEAD WITHOUT CONTRAST CT CERVICAL SPINE WITHOUT CONTRAST TECHNIQUE: Multidetector CT imaging of the head and cervical spine was performed following the standard protocol without intravenous contrast. Multiplanar CT image reconstructions of the cervical spine were also generated. COMPARISON:  None FINDINGS: CT HEAD FINDINGS Brain: Extensive acute infarction in the left MCA territory affecting both upper and lower divisions. There is also involvement of the deep white matter and putamen. The infarcts are well established but recent appearing. More low-density right occipital cortex infarct which is likely subacute to chronic. No hemorrhage, hydrocephalus, or  worrisome mass effect. Vascular: No hyperdense vessel or unexpected calcification. Skull: Negative for fracture Sinuses/Orbits: Bilateral cataract resection CT CERVICAL SPINE FINDINGS Alignment: Normal Skull base and vertebrae: No acute fracture Soft tissues and spinal canal: No prevertebral fluid or swelling. No visible canal hematoma. Disc levels:  Unremarkable for age Upper chest: No evidence of injury These results were called by telephone at the time of interpretation on 10/16/2020 at 11:28 am to provider [facility] , who verbally acknowledged these results. IMPRESSION: 1. Large acute left MCA territory infarct. 2. Smaller and more remote right occipital cortex infarct. 3. No hemorrhage or cervical spine fracture. Electronically Signed   By: Angelica Chessman  Watts M.D.   On: 10/05/2020 11:32   CT Cervical Spine Wo Contrast  Result Date: 10/24/2020 CLINICAL DATA:  Code stroke.  Neck trauma.  Found down. EXAM: CT HEAD WITHOUT CONTRAST CT CERVICAL SPINE WITHOUT CONTRAST TECHNIQUE: Multidetector CT imaging of the head and cervical spine was performed following the standard protocol without intravenous contrast. Multiplanar CT image reconstructions of the cervical spine were also generated. COMPARISON:  None FINDINGS: CT HEAD FINDINGS Brain: Extensive acute infarction in the left MCA territory affecting both upper and lower divisions. There is also involvement of the deep white matter and putamen. The infarcts are well established but recent appearing. More low-density right occipital cortex infarct which is likely subacute to chronic. No hemorrhage, hydrocephalus, or worrisome mass effect. Vascular: No hyperdense vessel or unexpected calcification. Skull: Negative for fracture Sinuses/Orbits: Bilateral cataract resection CT CERVICAL SPINE FINDINGS Alignment: Normal Skull base and vertebrae: No acute fracture Soft tissues and spinal canal: No prevertebral fluid or swelling. No visible canal hematoma. Disc levels:   Unremarkable for age Upper chest: No evidence of injury These results were called by telephone at the time of interpretation on 10/25/2020 at 11:28 am to provider [facility] , who verbally acknowledged these results. IMPRESSION: 1. Large acute left MCA territory infarct. 2. Smaller and more remote right occipital cortex infarct. 3. No hemorrhage or cervical spine fracture. Electronically Signed   By: Monte Fantasia M.D.   On: 10/14/2020 11:32   DG Chest Portable 1 View  Result Date: 10/24/2020 CLINICAL DATA:  Fall.  Found on the floor this morning. EXAM: PORTABLE CHEST 1 VIEW COMPARISON:  None. FINDINGS: Patient is slightly rotated towards the LEFT, accentuating the heart size. No focal consolidations or pleural effusions. No pulmonary edema. There is deformity of some of the LEFT-sided ribs, favoring remote fractures. IMPRESSION: 1.  No evidence for acute cardiopulmonary abnormality. 2. Suspect remote fractures of the LEFT ribs. If there is clinical evidence for acute fracture, consider rib series. Electronically Signed   By: Nolon Nations M.D.   On: 10/14/2020 11:49   DG Hip Unilat W or Wo Pelvis 2-3 Views Left  Result Date: 10/01/2020 CLINICAL DATA:  Fall.  Found on the floor this morning. EXAM: DG HIP (WITH OR WITHOUT PELVIS) 2-3V LEFT COMPARISON:  None. FINDINGS: There is no acute fracture or subluxation. Remote fractures of the RIGHT superior and inferior pubic rami. There are degenerative changes in the LOWER lumbar spine. IMPRESSION: No evidence for acute abnormality. Remote fractures of the RIGHT superior and inferior pubic rami. Electronically Signed   By: Nolon Nations M.D.   On: 10/07/2020 11:50   DG Hip Unilat W or Wo Pelvis 2-3 Views Right  Result Date: 10/29/2020 CLINICAL DATA:  Fall.  Found on the floor this morning. EXAM: DG HIP (WITH OR WITHOUT PELVIS) 2-3V RIGHT COMPARISON:  09/27/2016 FINDINGS: Deformity of the RIGHT inferior and superior pubic rami are consistent with remote  fracture. There is no acute fracture or subluxation. IMPRESSION: Remote fractures of the RIGHT superior and inferior pubic rami. No evidence for acute abnormality. Electronically Signed   By: Nolon Nations M.D.   On: 10/07/2020 11:46    ____________________________________________   PROCEDURES  Procedure(s) performed (including Critical Care):  .1-3 Lead EKG Interpretation Performed by: Vanessa Whetstone, MD Authorized by: Vanessa , MD     Interpretation: abnormal     ECG rate:  100-160s   ECG rate assessment: tachycardic     Rhythm: atrial fibrillation  Ectopy: none     Conduction: normal   .Critical Care Performed by: Vanessa Wiscon, MD Authorized by: Vanessa Gallatin, MD   Critical care provider statement:    Critical care time (minutes):  45   Critical care was necessary to treat or prevent imminent or life-threatening deterioration of the following conditions:  Cardiac failure   Critical care was time spent personally by me on the following activities:  Discussions with consultants, evaluation of patient's response to treatment, examination of patient, ordering and performing treatments and interventions, ordering and review of laboratory studies, ordering and review of radiographic studies, pulse oximetry, re-evaluation of patient's condition, obtaining history from patient or surrogate and review of old charts     ____________________________________________   INITIAL IMPRESSION / [place] / ED COURSE  SHAKENA CALLARI was evaluated in Emergency Department on 10/04/2020 for the symptoms described in the history of present illness. She was evaluated in the context of the global COVID-19 pandemic, which necessitated consideration that the patient might be at risk for infection with the SARS-CoV-2 virus that causes COVID-19. Institutional protocols and algorithms that pertain to the evaluation of patients at risk for COVID-19 are in a state of rapid change based  on information released by regulatory bodies including the CDC and federal and state organizations. These policies and algorithms were followed during the patient's care in the ED.     Patient is an 87 year old with last known normal sometime yesterday morning who comes in with altered mental status.  Patient is out of the window for tPA given unknown last known normal stroke code was not called.  However I did call CT scan to get stat CT head given my concern for potential intracranial hemorrhage vs old stroke.  Labs ordered to evaluate for Electra abnormalities, AKI, rhabdo, infection.  Patient is in new A. fib with RVR and was given some dilt which brought heart rates down.  Patient given fluids as well to help prevent hypotension.  Patient was satting in the low 90s so a few liters of oxygen was added on.  We will keep patient the cardiac monitor and continue to closely monitor  11:27 AM patient's daughter is at bedside who is the POA.  Updated them thus far on work-up.  Patient at this time has  sonorous respiration but her gas looked okay without evidence of hypercapnia but otherwise has continued to be normal on verbal.  Waiting for CT imaging and goals of care discussion.  She states that patient's mom gone through a stroke and was debilitated afterwards and that she had made it sound like she would not want that to happen to her.  On my review of CT scan I am concerned that there is a infarct in the MCA territory  11:30 AM discussed with radiology patient has left MCA infarct.  Patient is out of the window again for tPA and according to radiology looked like it had already completed most likely not an intervention for LVO at this time.  Will discuss with neurology Dr. Quinn Axe  1:23 PM d/w neuro  Given the stage of the stroke neurology did not recommend CTA given unlikely to benefit from any interventions at this point.  Patient's neuro status seems to be slightly worsening.  She is remained  nonresponsive and now has some sonorous respirations.  Discussed with the POA the daughter who stated that patient would not want to be intubated and would not want to have extraordinary measures  taken.  Neurology is going to come down and see patient and most likely family will like to be DNR and comfort care  12:51 PM with neurology who came and saw patient and patient is DNR and would like comfort care only  Admit to hospital      ____________________________________________   FINAL CLINICAL IMPRESSION(S) / ED DIAGNOSES   Final diagnoses:  Arterial ischemic stroke, MCA, left, acute ([place])  Atrial fibrillation with RVR ([place])      MEDICATIONS GIVEN DURING THIS VISIT:  Medications  acetaminophen (TYLENOL) tablet 650 mg (has no administration in time range)    Or  acetaminophen (TYLENOL) suppository 650 mg (has no administration in time range)  haloperidol (HALDOL) tablet 0.5 mg (has no administration in time range)    Or  haloperidol (HALDOL) 2 MG/ML solution 0.5 mg (has no administration in time range)    Or  haloperidol lactate (HALDOL) injection 0.5 mg (has no administration in time range)  ondansetron (ZOFRAN-ODT) disintegrating tablet 4 mg (has no administration in time range)    Or  ondansetron (ZOFRAN) injection 4 mg (has no administration in time range)  glycopyrrolate (ROBINUL) tablet 1 mg (has no administration in time range)    Or  glycopyrrolate (ROBINUL) injection 0.2 mg (has no administration in time range)    Or  glycopyrrolate (ROBINUL) injection 0.2 mg (has no administration in time range)  antiseptic oral rinse (BIOTENE) solution 15 mL (has no administration in time range)  polyvinyl alcohol (LIQUIFILM TEARS) 1.4 % ophthalmic solution 1 drop (has no administration in time range)  sodium chloride flush (NS) 0.9 % injection 3 mL (has no administration in time range)  sodium chloride flush (NS) 0.9 % injection 3 mL (has no administration in time range)  0.9  %  sodium chloride infusion (has no administration in time range)  LORazepam (ATIVAN) tablet 1 mg (has no administration in time range)    Or  LORazepam (ATIVAN) 2 MG/ML concentrated solution 1 mg (has no administration in time range)    Or  LORazepam (ATIVAN) injection 1 mg (has no administration in time range)  sodium chloride 0.9 % bolus 500 mL (0 mLs Intravenous Stopped 10/14/2020 1121)  diltiazem (CARDIZEM) injection 10 mg (10 mg Intravenous Given 10/12/2020 1039)     ED Discharge Orders    None       Note:  This document was prepared using Dragon voice recognition software and may include unintentional dictation errors.   Vanessa [place], MD 10/08/2020 1324

## 2020-10-15 NOTE — Progress Notes (Signed)
   10-31-2020 1523  Clinical Encounter Type  Visited With Patient and family together  Visit Type Initial;Spiritual support  Referral From Nurse  Consult/Referral To Chaplain  Spiritual Encounters  Spiritual Needs Prayer;Emotional  Advance Directives (For Healthcare)  Would patient like information on creating a medical advance directive? No - Guardian declined  Chaplain Justen Fonda responded to an OR in ED-09, Pt Mrs. Robin Ashley. Pt's daughter was by her side and the Pt was being transported to [facility]. I waited until the Pt was relocated and I visited with Pt and daughter. I provided  emotional support and prayer.

## 2020-10-16 DIAGNOSIS — I4891 Unspecified atrial fibrillation: Secondary | ICD-10-CM

## 2020-10-16 NOTE — Progress Notes (Signed)
PROGRESS NOTE   Robin Ashley  ZOX:[id number] DOB: September 19, 1931 DOA: 2020-10-20 PCP: Mar Daring, PA-C  Brief Narrative:   87 year old white female known DM TY 2 HTN HLD breast cancer last known normal 10:30 AM 5/15 Patient found to be unresponsive to verbal painful stimuli found to have large acute L MCA infarct and seen by neurology Patient was outside tPA window and not a candidate for intervention-family elected on comfort care   Hospital-Problem based course  Large MCA infarct Patient admitted for comfort measures Have de-escalated all nonessential meds and comfort trajectory Case management to determine hospice facility of choice as family cannot take care of her at home-at this time I do not think she is actively declining and we will ask for hospice choice to be offered to family Continue meds as per [facility] for comfort  DVT prophylaxis: None Code Status: DNR Family Communication: Discussed with daughter in detail at the bedside-have asked that a chaplain be called to help her Disposition:  Status is: Inpatient  Remains inpatient appropriate because:Hemodynamically unstable and Unsafe d/c plan   Dispo: The patient is from: From home              Anticipated d/c is to: Likely freestanding hospice when bed available              Patient currently is not medically stable to d/c.   Difficult to place patient No       Consultants:   Neurology  Procedures: None  Antimicrobials: No   Subjective: Nonresponsive   Objective: Vitals:   20-Oct-2020 1451 2020/10/20 1515 Oct 20, 2020 1550 10/16/20 0348  BP:  136/82 (!) 148/74 (!) 119/56  Pulse:  (!) 110 66 87  Resp:  (!) 24 20 18   Temp: 98.8 F (37.1 C)  98.6 F (37 C) 98.9 F (37.2 C)  TempSrc: Axillary  Axillary   SpO2:  100% 100% (!) 86%  Weight:      Height:        Intake/Output Summary (Last 24 hours) at 10/16/2020 1154 Last data filed at 10/16/2020 0321 Gross per 24 hour  Intake 4.69 ml  Output --   Net 4.69 ml   Filed Weights   10-20-2020 1033  Weight: 93.6 kg    Examination:  Pupils pinpoint Symmetrical chest rise S1-S2 no murmur Extremities are warm perfusing well CTA B no added sound   Data Reviewed: personally reviewed   CBC    Component Value Date/Time   WBC 19.3 (H) 10/20/20 1035   RBC 4.69 Oct 20, 2020 1035   HGB 14.0 2020/10/20 1035   HGB 12.4 04/25/2020 1503   HCT 42.5 Oct 20, 2020 1035   HCT 36.3 04/25/2020 1503   PLT 289 10/20/2020 1035   PLT 287 04/25/2020 1503   MCV 90.6 20-Oct-2020 1035   MCV 92 04/25/2020 1503   MCV 92 03/09/2013 1247   MCH 29.9 2020-10-20 1035   MCHC 32.9 2020-10-20 1035   RDW 14.3 10/20/20 1035   RDW 13.4 04/25/2020 1503   RDW 15.0 (H) 03/09/2013 1247   LYMPHSABS 1.7 10-20-20 1035   LYMPHSABS 2.4 04/25/2020 1503   LYMPHSABS 2.6 03/09/2013 1247   MONOABS 1.9 (H) 10-20-20 1035   MONOABS 0.8 03/09/2013 1247   EOSABS 0.0 10/20/2020 1035   EOSABS 0.3 04/25/2020 1503   EOSABS 0.3 03/09/2013 1247   BASOSABS 0.1 10-20-20 1035   BASOSABS 0.1 04/25/2020 1503   BASOSABS 0.1 03/09/2013 1247   CMP Latest Ref Rng & Units Oct 20, 2020 04/25/2020 01/08/2019  Glucose 70 - 99 mg/dL 150(H) 98 106(H)  BUN 8 - 23 mg/dL 27(H) 11 12  Creatinine 0.44 - 1.00 mg/dL 1.36(H) 1.20(H) 1.34(H)  Sodium 135 - 145 mmol/L 137 141 136  Potassium 3.5 - 5.1 mmol/L 4.4 3.5 4.4  Chloride 98 - 111 mmol/L 103 105 100  CO2 22 - 32 mmol/L 22 22 21   Calcium 8.9 - 10.3 mg/dL 8.9 8.6(L) 9.2  Total Protein 6.5 - 8.1 g/dL 7.4 6.6 6.5  Total Bilirubin 0.3 - 1.2 mg/dL 1.4(H) 0.4 0.3  Alkaline Phos 38 - 126 U/L 116 169(H) 117  AST 15 - 41 U/L 74(H) 13 14  ALT 0 - 44 U/L 31 12 7      Radiology Studies: CT Head Wo Contrast  Result Date: 10/10/2020 CLINICAL DATA:  Code stroke.  Neck trauma.  Found down. EXAM: CT HEAD WITHOUT CONTRAST CT CERVICAL SPINE WITHOUT CONTRAST TECHNIQUE: Multidetector CT imaging of the head and cervical spine was performed following the  standard protocol without intravenous contrast. Multiplanar CT image reconstructions of the cervical spine were also generated. COMPARISON:  None FINDINGS: CT HEAD FINDINGS Brain: Extensive acute infarction in the left MCA territory affecting both upper and lower divisions. There is also involvement of the deep white matter and putamen. The infarcts are well established but recent appearing. More low-density right occipital cortex infarct which is likely subacute to chronic. No hemorrhage, hydrocephalus, or worrisome mass effect. Vascular: No hyperdense vessel or unexpected calcification. Skull: Negative for fracture Sinuses/Orbits: Bilateral cataract resection CT CERVICAL SPINE FINDINGS Alignment: Normal Skull base and vertebrae: No acute fracture Soft tissues and spinal canal: No prevertebral fluid or swelling. No visible canal hematoma. Disc levels:  Unremarkable for age Upper chest: No evidence of injury These results were called by telephone at the time of interpretation on 10/04/2020 at 11:28 am to provider [facility] , who verbally acknowledged these results. IMPRESSION: 1. Large acute left MCA territory infarct. 2. Smaller and more remote right occipital cortex infarct. 3. No hemorrhage or cervical spine fracture. Electronically Signed   By: Monte Fantasia M.D.   On: 10/05/2020 11:32   CT Cervical Spine Wo Contrast  Result Date: 10/14/2020 CLINICAL DATA:  Code stroke.  Neck trauma.  Found down. EXAM: CT HEAD WITHOUT CONTRAST CT CERVICAL SPINE WITHOUT CONTRAST TECHNIQUE: Multidetector CT imaging of the head and cervical spine was performed following the standard protocol without intravenous contrast. Multiplanar CT image reconstructions of the cervical spine were also generated. COMPARISON:  None FINDINGS: CT HEAD FINDINGS Brain: Extensive acute infarction in the left MCA territory affecting both upper and lower divisions. There is also involvement of the deep white matter and putamen. The infarcts are  well established but recent appearing. More low-density right occipital cortex infarct which is likely subacute to chronic. No hemorrhage, hydrocephalus, or worrisome mass effect. Vascular: No hyperdense vessel or unexpected calcification. Skull: Negative for fracture Sinuses/Orbits: Bilateral cataract resection CT CERVICAL SPINE FINDINGS Alignment: Normal Skull base and vertebrae: No acute fracture Soft tissues and spinal canal: No prevertebral fluid or swelling. No visible canal hematoma. Disc levels:  Unremarkable for age Upper chest: No evidence of injury These results were called by telephone at the time of interpretation on 10/26/2020 at 11:28 am to provider [facility] , who verbally acknowledged these results. IMPRESSION: 1. Large acute left MCA territory infarct. 2. Smaller and more remote right occipital cortex infarct. 3. No hemorrhage or cervical spine fracture. Electronically Signed   By: Neva Seat.D.  On: Oct 23, 2020 11:32   DG Chest Portable 1 View  Result Date: Oct 23, 2020 CLINICAL DATA:  Fall.  Found on the floor this morning. EXAM: PORTABLE CHEST 1 VIEW COMPARISON:  None. FINDINGS: Patient is slightly rotated towards the LEFT, accentuating the heart size. No focal consolidations or pleural effusions. No pulmonary edema. There is deformity of some of the LEFT-sided ribs, favoring remote fractures. IMPRESSION: 1.  No evidence for acute cardiopulmonary abnormality. 2. Suspect remote fractures of the LEFT ribs. If there is clinical evidence for acute fracture, consider rib series. Electronically Signed   By: Nolon Nations M.D.   On: 2020/10/23 11:49   DG Hip Unilat W or Wo Pelvis 2-3 Views Left  Result Date: 10-23-2020 CLINICAL DATA:  Fall.  Found on the floor this morning. EXAM: DG HIP (WITH OR WITHOUT PELVIS) 2-3V LEFT COMPARISON:  None. FINDINGS: There is no acute fracture or subluxation. Remote fractures of the RIGHT superior and inferior pubic rami. There are degenerative changes  in the LOWER lumbar spine. IMPRESSION: No evidence for acute abnormality. Remote fractures of the RIGHT superior and inferior pubic rami. Electronically Signed   By: Nolon Nations M.D.   On: Oct 23, 2020 11:50   DG Hip Unilat W or Wo Pelvis 2-3 Views Right  Result Date: Oct 23, 2020 CLINICAL DATA:  Fall.  Found on the floor this morning. EXAM: DG HIP (WITH OR WITHOUT PELVIS) 2-3V RIGHT COMPARISON:  09/27/2016 FINDINGS: Deformity of the RIGHT inferior and superior pubic rami are consistent with remote fracture. There is no acute fracture or subluxation. IMPRESSION: Remote fractures of the RIGHT superior and inferior pubic rami. No evidence for acute abnormality. Electronically Signed   By: Nolon Nations M.D.   On: 2020/10/23 11:46     Scheduled Meds: . sodium chloride flush  3 mL Intravenous Q12H   Continuous Infusions: . sodium chloride    . morphine 1 mg/hr (10-23-20 2239)     LOS: 1 day   Time spent: 26 minutes in discussion with family  Nita Sells, MD [facility] To contact the attending provider between 7A-7P or the covering provider during after hours 7P-7A, please log into the web site [email or web address] and access using universal  password for that web site. If you do not have the password, please call the hospital operator.  10/16/2020, 11:54 AM

## 2020-10-16 NOTE — TOC Initial Note (Signed)
Transition of Care [facility]) - Initial/Assessment Note    Patient Details  Name: Robin Ashley MRN: [id number] Date of Birth: 13-Nov-1931  [facility]) CM/SW Contact:    Robin Hutching, RN Phone Number: 10/16/2020, 2:53 PM  Clinical Narrative:                 Patient admitted to the hospital with large ischemic stroke, unresponsive, family has chosen comfort measures.  RNCM met with Patient's daughter Robin Ashley is at the bedside.  RNCM discussed residential hospice and Robin Ashley feels that hospice home is the next logical step.  Robin Ashley chooses [facility] facility in [place].  Robin Ashley with [place] given hospice referral.    Expected Discharge Plan: [place] Barriers to Discharge: Hospice Bed not available   Patient Goals and CMS Choice Patient states their goals for this hospitalization and ongoing recovery are:: Daughter is at the bedside and just wants patient to be comfortable- agreeable to hospice home CMS Medicare.gov Compare Post Acute Care list provided to:: Patient Represenative (must comment) Choice offered to / list presented to : Adult Children  Expected Discharge Plan and Services Expected Discharge Plan: [place] In-house Referral: Hospice / Palliative Care Discharge Planning Services: CM Consult Post Acute Care Choice: Hospice Living arrangements for the past 2 months: Apartment                 DME Arranged: N/A DME Agency: NA       HH Arranged: NA HH Agency: NA        Prior Living Arrangements/Services Living arrangements for the past 2 months: Apartment Lives with:: Self Patient language and need for interpreter reviewed:: Yes Do you feel safe going back to the place where you live?: Yes      Need for Family Participation in Patient Care: Yes (Comment) (large ischemic stroke) Care giver support system in place?: Yes (comment) (daughter)   Criminal Activity/Legal Involvement Pertinent to Current  Situation/Hospitalization: No - Comment as needed  Activities of Daily Living      Permission Sought/Granted Permission sought to share information with : Case Manager,Family Chief Financial Officer Permission granted to share information with : Yes, Verbal Permission Granted  Share Information with NAME: Robin Ashley  Permission granted to share info w AGENCY: [facility]  Permission granted to share info w Relationship: daughter     Emotional Assessment Appearance:: Appears stated age       Alcohol / Substance Use: Not Applicable Psych Involvement: No (comment)  Admission diagnosis:  Arterial ischemic stroke, MCA, left, acute ([place]) [I63.512] Atrial fibrillation with RVR ([place]) [I48.91] Acute CVA (cerebrovascular accident) [facility]) [I63.9] Patient Active Problem List   Diagnosis Date Noted  . Acute CVA (cerebrovascular accident) ([place]) 10/03/2020  . Atrial fibrillation with RVR ([place]) 10/16/2020  . Rhabdomyolysis 10/24/2020  . Arterial ischemic stroke, MCA, left, acute ([place])   . Osteoporosis 05/01/2017  . Postmenopausal bone loss 03/19/2017  . Pubic ramus fracture ([place]) 09/27/2016  . Lymphedema 08/21/2016  . Osteoarthritis of left shoulder 12/22/2015  . Paresthesia and pain of right extremity 06/19/2015  . Diverticulosis 02/17/2015  . Adaptation reaction 02/16/2015  . At risk for falling 02/16/2015  . Benign hypertension with CKD (chronic kidney disease) stage III ([place]) 02/16/2015  . Adult BMI 30+ 02/16/2015  . Chronic kidney disease (CKD), stage III (moderate) ([place]) 02/16/2015  . CN (constipation) 02/16/2015  . Diabetes ([place]) 02/16/2015  . Accumulation of fluid in tissues 02/16/2015  . H/O gastric  ulcer 02/16/2015  . Hypercholesteremia 02/16/2015  . Insomnia 02/16/2015  . Gonalgia 02/16/2015  . Cramps of lower extremity 02/16/2015  . Fungal infection of toenail 02/16/2015  . Pain in shoulder 02/16/2015  . Arthritis 11/22/2014  . Helicobacter pylori  gastrointestinal tract infection 05/18/2014  . Anemia, iron deficiency 03/27/2014  . Bilateral lower extremity edema 02/22/2014  . History of breast cancer 03/04/2013   PCP:  Robin Daring, PA-C Pharmacy:   [place], [address] [place] [address] [place] [address] Phone: [phone] Fax: [phone]     Social Determinants of Health (SDOH) Interventions    Readmission Risk Interventions No flowsheet data found.

## 2020-10-16 NOTE — Progress Notes (Signed)
Staff asked me to check in with this family bedside this AM. There were no needs at the time. Follow up visit later in afternoon, daughter had just met with hospice, she was pleased with visit and goals of care. I assured her a chaplain available if and when needed.

## 2020-10-16 NOTE — Progress Notes (Addendum)
[facility] Liaison note: New referral for [facility] hospice home received from [place]. Patient information sent to referral.Hospice home eligibility has been confirmed. Writer met in the room with patient's daughter Amado Nash to initiate education regarding hospice services, philosophy, team approach to care and current visitation policy with understanding voiced. Hospice information given to [facility]  Hospice home does not have bed availability today. TOC and family are aware. Liaison to follow daily and update family and hospital care team regarding bed availability. Thank you the opportunity to be involved in the care of this patient and her family. Flo Shanks BSN, RN, [place] [phone]

## 2020-10-17 DIAGNOSIS — Z515 Encounter for palliative care: Secondary | ICD-10-CM

## 2020-10-17 DIAGNOSIS — I4891 Unspecified atrial fibrillation: Secondary | ICD-10-CM | POA: Diagnosis not present

## 2020-10-17 MED ORDER — LORAZEPAM 2 MG/ML PO CONC: 1.0000 mg | ORAL | 0 refills | Status: AC | PRN

## 2020-10-17 MED ORDER — GLYCOPYRROLATE 0.2 MG/ML IJ SOLN: 0.2000 mg | INTRAMUSCULAR | Status: AC | PRN

## 2020-10-17 MED ORDER — MORPHINE 100MG IN NS 100ML (1MG/ML) PREMIX INFUSION: 1.0000 mg/h | INTRAVENOUS | 0 refills | Status: AC

## 2020-10-17 NOTE — TOC Progression Note (Signed)
Transition of Care [facility]) - Progression Note    Patient Details  Name: Robin Ashley MRN: [id number] Date of Birth: 1931-09-29  Transition of Care [facility]) CM/SW Contact  Shelbie Hutching, RN Phone Number: 10/17/2020, 3:58 PM  Clinical Narrative:    No hospice home bed available today.    Expected Discharge Plan: [place] Barriers to Discharge: Hospice Bed not available  Expected Discharge Plan and Services Expected Discharge Plan: [place] In-house Referral: Hospice / Palliative Care Discharge Planning Services: CM Consult Post Acute Care Choice: Hospice Living arrangements for the past 2 months: Apartment Expected Discharge Date: 10/17/20               DME Arranged: N/A DME Agency: NA       HH Arranged: NA HH Agency: NA         Social Determinants of Health (SDOH) Interventions    Readmission Risk Interventions No flowsheet data found.

## 2020-10-17 NOTE — Progress Notes (Signed)
[facility]) Hospital Liaison RN note:  [place] is not able to offer a room today. Hospital care team is aware. Spoke with daughter, Andris Flurry to provide update. [place] Liaison will continue to follow for room availability.  Please call with any hospice related questions or concerns.  Zandra Abts, RN [facility] Liaison [phone]

## 2020-10-17 NOTE — Discharge Summary (Signed)
Physician Discharge Summary  Robin Ashley OVF:[id number] DOB: 01-29-1932 DOA: 10/01/2020  PCP: Mar Daring, PA-C  Admit date: 10/04/2020 Discharge date: 10/17/2020  Time spent: 27 minutes  Recommendations for Outpatient Follow-up:  1. 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 and high risk for aspiration  Please see below for itemized issues addressed in [place]- refer to other progress notes for clarity if needed  Discharge Condition: Comfort guided  Diet recommendation: Comfort  Filed Weights   10/02/2020 1033  Weight: 93.6 kg    History of present illness:  87 year old white female known DM TY 2 HTN HLD breast cancer last known normal 10:30 AM 5/15 Patient found to be unresponsive to verbal painful stimuli found to have large acute L MCA infarct and seen by neurology Patient was outside tPA window and not a candidate for intervention-family elected on comfort care   Hospital Course:  Large MCA infarct Patient admitted for comfort measures All nonessential meds de-escalated Case management to determine hospice facility of choice as family cannot take care of her at home-at this time Continue meds as per [facility] for comfort  I have discussed the plan of care extensively with the patient family and surrogate at the bedside daughter and she understands   Consultations:  Palliative care  Neurology  Discharge Exam: Vitals:   10/17/20 0500 10/17/20 0805  BP: 129/67 (!) 163/144  Pulse: 78 (!) 107  Resp: 10 16  Temp: 97.9 F (36.6 C) 98.9 F (37.2 C)  SpO2: 99% 91%    Subj on day of d/c   Listless nonresponsive  General Exam on discharge  Obese white female no distress slightly pinpoint pupils EOMI S1-S2 no murmur no rub no gallop Abdomen soft no rebound no guarding not moving 4 limbs  equally cannot assess neurologic  Discharge Instructions   Discharge Instructions    Diet - low sodium heart healthy   Complete by: As directed    Increase activity slowly   Complete by: As directed      Allergies as of 10/17/2020   No Known Allergies     Medication List    STOP taking these medications   alendronate 70 MG tablet Commonly known as: FOSAMAX   amLODipine 5 MG tablet Commonly known as: NORVASC   aspirin 81 MG tablet   atorvastatin 80 MG tablet Commonly known as: LIPITOR   CENTRUM SILVER PO   ferrous sulfate 325 (65 FE) MG tablet   furosemide 20 MG tablet Commonly known as: LASIX   letrozole 2.5 MG tablet Commonly known as: FEMARA   lisinopril 40 MG tablet Commonly known as: ZESTRIL   meloxicam 15 MG tablet Commonly known as: MOBIC   metFORMIN 500 MG tablet Commonly known as: GLUCOPHAGE   omeprazole 20 MG capsule Commonly known as: PRILOSEC   Omeprazole 20 MG Tbec   polyethylene glycol powder 17 GM/SCOOP powder Commonly known as: GLYCOLAX/MIRALAX   traZODone 50 MG tablet Commonly known as: DESYREL     TAKE these medications   glycopyrrolate 0.2 MG/ML injection Commonly known as: ROBINUL Inject 1 mL (0.2 mg total) into the skin every 4 (four) hours as needed (excessive secretions).   LORazepam 2 MG/ML concentrated solution Commonly known as: ATIVAN Place 0.5 mLs (1 mg total) under the tongue every 4 (four) hours as needed for anxiety.   morphine 1 mg/mL Soln infusion Inject 1-10  mg/hr into the vein continuous.      No Known Allergies    The results of significant diagnostics from this hospitalization (including imaging, microbiology, ancillary and laboratory) are listed below for reference.    Significant Diagnostic Studies: CT Head Wo Contrast  Result Date: 10/05/2020 CLINICAL DATA:  Code stroke.  Neck trauma.  Found down. EXAM: CT HEAD WITHOUT CONTRAST CT CERVICAL SPINE WITHOUT CONTRAST TECHNIQUE: Multidetector CT  imaging of the head and cervical spine was performed following the standard protocol without intravenous contrast. Multiplanar CT image reconstructions of the cervical spine were also generated. COMPARISON:  None FINDINGS: CT HEAD FINDINGS Brain: Extensive acute infarction in the left MCA territory affecting both upper and lower divisions. There is also involvement of the deep white matter and putamen. The infarcts are well established but recent appearing. More low-density right occipital cortex infarct which is likely subacute to chronic. No hemorrhage, hydrocephalus, or worrisome mass effect. Vascular: No hyperdense vessel or unexpected calcification. Skull: Negative for fracture Sinuses/Orbits: Bilateral cataract resection CT CERVICAL SPINE FINDINGS Alignment: Normal Skull base and vertebrae: No acute fracture Soft tissues and spinal canal: No prevertebral fluid or swelling. No visible canal hematoma. Disc levels:  Unremarkable for age Upper chest: No evidence of injury These results were called by telephone at the time of interpretation on 10/26/2020 at 11:28 am to provider [facility] , who verbally acknowledged these results. IMPRESSION: 1. Large acute left MCA territory infarct. 2. Smaller and more remote right occipital cortex infarct. 3. No hemorrhage or cervical spine fracture. Electronically Signed   By: Monte Fantasia M.D.   On: 10/24/2020 11:32   CT Cervical Spine Wo Contrast  Result Date: 10/11/2020 CLINICAL DATA:  Code stroke.  Neck trauma.  Found down. EXAM: CT HEAD WITHOUT CONTRAST CT CERVICAL SPINE WITHOUT CONTRAST TECHNIQUE: Multidetector CT imaging of the head and cervical spine was performed following the standard protocol without intravenous contrast. Multiplanar CT image reconstructions of the cervical spine were also generated. COMPARISON:  None FINDINGS: CT HEAD FINDINGS Brain: Extensive acute infarction in the left MCA territory affecting both upper and lower divisions. There is also  involvement of the deep white matter and putamen. The infarcts are well established but recent appearing. More low-density right occipital cortex infarct which is likely subacute to chronic. No hemorrhage, hydrocephalus, or worrisome mass effect. Vascular: No hyperdense vessel or unexpected calcification. Skull: Negative for fracture Sinuses/Orbits: Bilateral cataract resection CT CERVICAL SPINE FINDINGS Alignment: Normal Skull base and vertebrae: No acute fracture Soft tissues and spinal canal: No prevertebral fluid or swelling. No visible canal hematoma. Disc levels:  Unremarkable for age Upper chest: No evidence of injury These results were called by telephone at the time of interpretation on 10/14/2020 at 11:28 am to provider [facility] , who verbally acknowledged these results. IMPRESSION: 1. Large acute left MCA territory infarct. 2. Smaller and more remote right occipital cortex infarct. 3. No hemorrhage or cervical spine fracture. Electronically Signed   By: Monte Fantasia M.D.   On: 10/30/2020 11:32   DG Chest Portable 1 View  Result Date: 10/24/2020 CLINICAL DATA:  Fall.  Found on the floor this morning. EXAM: PORTABLE CHEST 1 VIEW COMPARISON:  None. FINDINGS: Patient is slightly rotated towards the LEFT, accentuating the heart size. No focal consolidations or pleural effusions. No pulmonary edema. There is deformity of some of the LEFT-sided ribs, favoring remote fractures. IMPRESSION: 1.  No evidence for acute cardiopulmonary abnormality. 2. Suspect remote fractures of the LEFT ribs.  If there is clinical evidence for acute fracture, consider rib series. Electronically Signed   By: Nolon Nations M.D.   On: 10/29/2020 11:49   DG Hip Unilat W or Wo Pelvis 2-3 Views Left  Result Date: 10/31/2020 CLINICAL DATA:  Fall.  Found on the floor this morning. EXAM: DG HIP (WITH OR WITHOUT PELVIS) 2-3V LEFT COMPARISON:  None. FINDINGS: There is no acute fracture or subluxation. Remote fractures of the RIGHT  superior and inferior pubic rami. There are degenerative changes in the LOWER lumbar spine. IMPRESSION: No evidence for acute abnormality. Remote fractures of the RIGHT superior and inferior pubic rami. Electronically Signed   By: Nolon Nations M.D.   On: 10/28/2020 11:50   DG Hip Unilat W or Wo Pelvis 2-3 Views Right  Result Date: 10/09/2020 CLINICAL DATA:  Fall.  Found on the floor this morning. EXAM: DG HIP (WITH OR WITHOUT PELVIS) 2-3V RIGHT COMPARISON:  09/27/2016 FINDINGS: Deformity of the RIGHT inferior and superior pubic rami are consistent with remote fracture. There is no acute fracture or subluxation. IMPRESSION: Remote fractures of the RIGHT superior and inferior pubic rami. No evidence for acute abnormality. Electronically Signed   By: Nolon Nations M.D.   On: 10/28/2020 11:46    Microbiology: Recent Results (from the past 240 hour(s))  Blood culture (routine x 2)     Status: None (Preliminary result)   Collection Time: 10/07/2020 10:35 AM   Specimen: BLOOD  Result Value Ref Range Status   Specimen Description BLOOD RIGHT ANTECUBITAL  Final   Special Requests   Final    BOTTLES DRAWN AEROBIC AND ANAEROBIC Blood Culture results may not be optimal due to an excessive volume of blood received in culture bottles   Culture   Final    NO GROWTH < 24 HOURS Performed at [facility], [address], [place], [place] [address]    Report Status PENDING  Incomplete  Blood culture (routine x 2)     Status: None (Preliminary result)   Collection Time: 10/12/2020 10:35 AM   Specimen: BLOOD  Result Value Ref Range Status   Specimen Description BLOOD LEFT ANTECUBITAL  Final   Special Requests   Final    BOTTLES DRAWN AEROBIC AND ANAEROBIC Blood Culture results may not be optimal due to an excessive volume of blood received in culture bottles   Culture   Final    NO GROWTH < 24 HOURS Performed at [facility], [address], [place],  [address]    Report  Status PENDING  Incomplete  Resp Panel by RT-PCR (Flu A&B, Covid) Nasopharyngeal Swab     Status: None   Collection Time: 10/31/2020 12:24 PM   Specimen: Nasopharyngeal Swab; Nasopharyngeal(NP) swabs in vial transport medium  Result Value Ref Range Status   SARS Coronavirus 2 by RT PCR NEGATIVE NEGATIVE Final    Comment: (NOTE) SARS-CoV-2 target nucleic acids are NOT DETECTED.  The SARS-CoV-2 RNA is generally detectable in upper respiratory specimens during the acute phase of infection. The lowest concentration of SARS-CoV-2 viral copies this assay can detect is 138 copies/mL. A negative result does not preclude SARS-Cov-2 infection and should not be used as the sole basis for treatment or other patient management decisions. A negative result may occur with  improper specimen collection/handling, submission of specimen other than nasopharyngeal swab, presence of viral mutation(s) within the areas targeted by this assay, and inadequate number of viral copies(<138 copies/mL). A negative result must be combined with clinical  observations, patient history, and epidemiological information. The expected result is Negative.  Fact Sheet for Patients:  EntrepreneurPulse.com.au  Fact Sheet for Healthcare Providers:  IncredibleEmployment.be  This test is no t yet approved or cleared by the [place] FDA and  has been authorized for detection and/or diagnosis of SARS-CoV-2 by FDA under an Emergency Use Authorization (EUA). This EUA will remain  in effect (meaning this test can be used) for the duration of the COVID-19 declaration under Section 564(b)(1) of the Act, 21 U.S.C.section 360bbb-3(b)(1), unless the authorization is terminated  or revoked sooner.       Influenza A by PCR NEGATIVE NEGATIVE Final   Influenza B by PCR NEGATIVE NEGATIVE Final    Comment: (NOTE) The Xpert Xpress SARS-CoV-2/FLU/RSV plus assay is intended as an aid in the diagnosis  of influenza from Nasopharyngeal swab specimens and should not be used as a sole basis for treatment. Nasal washings and aspirates are unacceptable for Xpert Xpress SARS-CoV-2/FLU/RSV testing.  Fact Sheet for Patients: EntrepreneurPulse.com.au  Fact Sheet for Healthcare Providers: IncredibleEmployment.be  This test is not yet approved or cleared by the [place] FDA and has been authorized for detection and/or diagnosis of SARS-CoV-2 by FDA under an Emergency Use Authorization (EUA). This EUA will remain in effect (meaning this test can be used) for the duration of the COVID-19 declaration under Section 564(b)(1) of the Act, 21 U.S.C. section 360bbb-3(b)(1), unless the authorization is terminated or revoked.  Performed at [facility], [address] [place],  [address]      Labs: Basic Metabolic Panel: Recent Labs  Lab 10-22-20 1035  NA 137  K 4.4  CL 103  CO2 22  GLUCOSE 150*  BUN 27*  CREATININE 1.36*  CALCIUM 8.9  MG 2.2   Liver Function Tests: Recent Labs  Lab 2020/10/22 1035  AST 74*  ALT 31  ALKPHOS 116  BILITOT 1.4*  PROT 7.4  ALBUMIN 3.5   No results for input(s): LIPASE, AMYLASE in the last 168 hours. No results for input(s): AMMONIA in the last 168 hours. CBC: Recent Labs  Lab 10-22-2020 1035  WBC 19.3*  NEUTROABS 15.6*  HGB 14.0  HCT 42.5  MCV 90.6  PLT 289   Cardiac Enzymes: Recent Labs  Lab 22-Oct-2020 1035  CKTOTAL 1,274*   BNP: BNP (last 3 results) Recent Labs    10-22-20 1035  BNP 170.5*    ProBNP (last 3 results) No results for input(s): PROBNP in the last 8760 hours.  CBG: Recent Labs  Lab 10/22/20 1032  GLUCAP 145*       Signed:  Nita Sells MD   [facility] 10/17/2020, 2:44 PM

## 2020-10-18 DIAGNOSIS — I4891 Unspecified atrial fibrillation: Secondary | ICD-10-CM | POA: Diagnosis not present

## 2020-10-18 NOTE — Progress Notes (Addendum)
Patient large MCA stroke resulting in metabolic encephalopathy, patient was evaluated by neurology, patient was outside tPA window and not candidate for intervention.  Family elected for comfort care. PMH; CKD stage III b.   Patient currently awaiting hospice facility. Patient is lethargic, non responsive, shallow breathing.  Does not appears in pain.

## 2020-10-18 NOTE — Care Management Important Message (Signed)
Important Message  Patient Details  Name: Robin Ashley MRN: [id number] Date of Birth: 11/18/1931   Medicare Important Message Given:  N/A - LOS <3 / Initial given by admissions     Juliann Pulse A [name] 10/18/2020, 7:33 AM

## 2020-10-18 NOTE — Progress Notes (Signed)
   10/18/20 1037  Clinical Encounter Type  Visited With Patient  Visit Type Initial  Referral From Nurse  Consult/Referral To [place] received a page from nurses station 1C, regarding a Pt request to see a [place] contacted [employer]  and left a message with his Network engineer. I relayed the message, room number and name of the Pt. (1C-115), [place]

## 2020-10-18 NOTE — Progress Notes (Signed)
[facility]) Hospital Liaison RN note:  Unfortunately, Hospice Home is not able to offer a room today. Spoke with daughter, Andris Flurry to provide update. Hospital care team is aware. [place] Liaison will continue to follow for bed availability.  Please call with any hospice related questions or concerns.  Zandra Abts, RN [facility] Liaison [phone]

## 2020-10-19 DIAGNOSIS — I639 Cerebral infarction, unspecified: Secondary | ICD-10-CM

## 2020-10-20 LAB — CULTURE, BLOOD (ROUTINE X 2)
Culture: NO GROWTH
Culture: NO GROWTH

## 2020-11-01 NOTE — Progress Notes (Signed)
   11/10/20 0500  Clinical Encounter Type  Visited With Family;Health care provider  Visit Type Follow-up  Referral From Nurse  Spiritual Encounters  Spiritual Needs Emotional;Grief support   This chaplain received a call to support the patient's daughter in the wake of her passing. We talked until the patient's son-in-law arrived. Upon arrival, the patient's daughter was sitting at her mother's bedside. She was tearful and grieving appropriately. She engaged in a life review, sharing stories of her mother, her family, and their relationships. She also processed her grief in the context of a relationship that has not always been easy or perfect. Support provided in the form of active and reflective listening, compassionate ministerial presence, facilitation of life review, and theological reflection.  Gennaro Africa, Chaplain

## 2020-11-01 NOTE — Death Summary Note (Signed)
Death Summary  Robin Ashley:[id number] DOB: 06-Jul-1931 DOA: 11/05/20  PCP: Mar Daring, PA-C   Admit date: November 05, 2020 Date of Death: Nov 09, 2020  Final Diagnoses:  Principal Problem:   Acute CVA (cerebrovascular accident) [facility]) Active Problems:   Chronic kidney disease (CKD), stage IIIb (moderate) ([place])   Atrial fibrillation with RVR ([place])   Rhabdomyolysis   Hospice care patient   Palliative care patient      History of present illness:  86 year old with past medical history significant for diabetes type 2, hypertension, hyperlipidemia, breast cancer, last known normal 10:30 AM on 11-06-2022.  Patient was found to be unresponsive to verbal and painful stimuli and found to have a large acute left MCA infarct.  She was evaluated by neurology and she was outside of tPA window and not a candidate for intervention.  Family elected for comfort measure.  Patient was waiting for residential hospice facility, when she deceased on 2022/11/10 at 3;40am.       Time: 3:40 AM 09-Nov-2020  Signed:  Jerald Kief A Harriette Tovey  [facility] 11/09/20, 12:41 PM

## 2020-11-01 NOTE — Progress Notes (Signed)
Patient expired at 0340, noted no respiration, no pulse and no heartbeat, verified by Earlie Server, RN. Family at bedside, MD notified, [facility] notified and Donor services notified.

## 2020-11-01 NOTE — Care Management Important Message (Signed)
Important Message  Patient Details  Name: Robin Ashley MRN: [id number] Date of Birth: 10/06/1931   Medicare Important Message Given:  Other (see comment)  Patient is on comfort care and awaiting a bed at the [facility]. Out of respect for the patient and family no Important Message from [facility] given.  Juliann Pulse A Vaneta Hammontree 2020-10-28, 7:35 AM

## 2020-11-01 DEATH — deceased
